# Patient Record
Sex: Female | Born: 1950 | Race: White | Hispanic: No | Marital: Married | Smoking: Never smoker
Health system: Southern US, Community
[De-identification: ages and names within clinical notes are randomized; demographics above are authoritative.]

## PROBLEM LIST (undated history)

## (undated) DIAGNOSIS — I10 Essential (primary) hypertension: Secondary | ICD-10-CM

## (undated) DIAGNOSIS — N186 End stage renal disease: Secondary | ICD-10-CM

## (undated) DIAGNOSIS — M549 Dorsalgia, unspecified: Secondary | ICD-10-CM

## (undated) DIAGNOSIS — Z124 Encounter for screening for malignant neoplasm of cervix: Secondary | ICD-10-CM

## (undated) DIAGNOSIS — D649 Anemia, unspecified: Secondary | ICD-10-CM

## (undated) DIAGNOSIS — S42401A Unspecified fracture of lower end of right humerus, initial encounter for closed fracture: Secondary | ICD-10-CM

## (undated) DIAGNOSIS — C029 Malignant neoplasm of tongue, unspecified: Secondary | ICD-10-CM

## (undated) DIAGNOSIS — N200 Calculus of kidney: Secondary | ICD-10-CM

## (undated) DIAGNOSIS — K635 Polyp of colon: Secondary | ICD-10-CM

## (undated) DIAGNOSIS — G4730 Sleep apnea, unspecified: Secondary | ICD-10-CM

## (undated) DIAGNOSIS — Z945 Skin transplant status: Secondary | ICD-10-CM

## (undated) DIAGNOSIS — F329 Major depressive disorder, single episode, unspecified: Secondary | ICD-10-CM

## (undated) DIAGNOSIS — J810 Acute pulmonary edema: Secondary | ICD-10-CM

## (undated) DIAGNOSIS — J189 Pneumonia, unspecified organism: Secondary | ICD-10-CM

## (undated) DIAGNOSIS — K219 Gastro-esophageal reflux disease without esophagitis: Secondary | ICD-10-CM

## (undated) DIAGNOSIS — E669 Obesity, unspecified: Secondary | ICD-10-CM

## (undated) DIAGNOSIS — M1990 Unspecified osteoarthritis, unspecified site: Secondary | ICD-10-CM

## (undated) DIAGNOSIS — E039 Hypothyroidism, unspecified: Secondary | ICD-10-CM

## (undated) DIAGNOSIS — K626 Ulcer of anus and rectum: Secondary | ICD-10-CM

## (undated) DIAGNOSIS — F32A Depression, unspecified: Secondary | ICD-10-CM

## (undated) DIAGNOSIS — Z9889 Other specified postprocedural states: Secondary | ICD-10-CM

## (undated) HISTORY — PX: CATARACT EXTRACTION: SUR2

## (undated) HISTORY — DX: Malignant neoplasm of tongue, unspecified: C02.9

## (undated) HISTORY — DX: Unspecified fracture of lower end of right humerus, initial encounter for closed fracture: S42.401A

## (undated) HISTORY — DX: Obesity, unspecified: E66.9

## (undated) HISTORY — DX: Essential (primary) hypertension: I10

## (undated) HISTORY — PX: DILATION AND CURETTAGE OF UTERUS: SHX78

## (undated) HISTORY — DX: Major depressive disorder, single episode, unspecified: F32.9

## (undated) HISTORY — DX: Hypothyroidism, unspecified: E03.9

## (undated) HISTORY — DX: Depression, unspecified: F32.A

## (undated) HISTORY — DX: Polyp of colon: K63.5

## (undated) HISTORY — DX: Anemia, unspecified: D64.9

## (undated) HISTORY — DX: Calculus of kidney: N20.0

## (undated) HISTORY — DX: Dorsalgia, unspecified: M54.9

## (undated) HISTORY — DX: Encounter for screening for malignant neoplasm of cervix: Z12.4

---

## 1998-08-31 ENCOUNTER — Other Ambulatory Visit: Admission: RE | Admit: 1998-08-31 | Discharge: 1998-08-31 | Payer: Self-pay | Admitting: *Deleted

## 1999-07-01 ENCOUNTER — Ambulatory Visit (HOSPITAL_COMMUNITY): Admission: RE | Admit: 1999-07-01 | Discharge: 1999-07-01 | Payer: Self-pay | Admitting: *Deleted

## 1999-08-16 DIAGNOSIS — K635 Polyp of colon: Secondary | ICD-10-CM

## 1999-08-16 HISTORY — DX: Polyp of colon: K63.5

## 1999-11-04 ENCOUNTER — Other Ambulatory Visit: Admission: RE | Admit: 1999-11-04 | Discharge: 1999-11-04 | Payer: Self-pay | Admitting: *Deleted

## 2000-01-27 ENCOUNTER — Encounter (INDEPENDENT_AMBULATORY_CARE_PROVIDER_SITE_OTHER): Payer: Self-pay | Admitting: *Deleted

## 2000-01-27 ENCOUNTER — Ambulatory Visit (HOSPITAL_COMMUNITY): Admission: RE | Admit: 2000-01-27 | Discharge: 2000-01-27 | Payer: Self-pay | Admitting: Gastroenterology

## 2001-01-17 ENCOUNTER — Other Ambulatory Visit: Admission: RE | Admit: 2001-01-17 | Discharge: 2001-01-17 | Payer: Self-pay | Admitting: *Deleted

## 2002-01-13 ENCOUNTER — Emergency Department (HOSPITAL_COMMUNITY): Admission: EM | Admit: 2002-01-13 | Discharge: 2002-01-13 | Payer: Self-pay | Admitting: Emergency Medicine

## 2002-01-13 ENCOUNTER — Encounter: Payer: Self-pay | Admitting: Emergency Medicine

## 2003-08-03 ENCOUNTER — Emergency Department (HOSPITAL_COMMUNITY): Admission: AD | Admit: 2003-08-03 | Discharge: 2003-08-03 | Payer: Self-pay | Admitting: Family Medicine

## 2003-08-10 ENCOUNTER — Emergency Department (HOSPITAL_COMMUNITY): Admission: AD | Admit: 2003-08-10 | Discharge: 2003-08-10 | Payer: Self-pay | Admitting: Internal Medicine

## 2004-07-07 ENCOUNTER — Ambulatory Visit: Payer: Self-pay | Admitting: Internal Medicine

## 2004-08-02 ENCOUNTER — Emergency Department (HOSPITAL_COMMUNITY): Admission: EM | Admit: 2004-08-02 | Discharge: 2004-08-02 | Payer: Self-pay | Admitting: Family Medicine

## 2004-11-10 ENCOUNTER — Ambulatory Visit: Payer: Self-pay | Admitting: Internal Medicine

## 2004-11-19 ENCOUNTER — Ambulatory Visit: Payer: Self-pay | Admitting: Internal Medicine

## 2005-01-03 ENCOUNTER — Ambulatory Visit: Payer: Self-pay | Admitting: Internal Medicine

## 2005-06-06 ENCOUNTER — Emergency Department (HOSPITAL_COMMUNITY): Admission: EM | Admit: 2005-06-06 | Discharge: 2005-06-06 | Payer: Self-pay | Admitting: Family Medicine

## 2005-06-08 ENCOUNTER — Emergency Department (HOSPITAL_COMMUNITY): Admission: EM | Admit: 2005-06-08 | Discharge: 2005-06-08 | Payer: Self-pay | Admitting: Family Medicine

## 2005-06-24 ENCOUNTER — Ambulatory Visit: Payer: Self-pay | Admitting: Internal Medicine

## 2005-07-01 ENCOUNTER — Ambulatory Visit: Payer: Self-pay | Admitting: Internal Medicine

## 2005-07-04 ENCOUNTER — Ambulatory Visit: Payer: Self-pay | Admitting: Internal Medicine

## 2005-11-07 ENCOUNTER — Emergency Department (HOSPITAL_COMMUNITY): Admission: EM | Admit: 2005-11-07 | Discharge: 2005-11-07 | Payer: Self-pay | Admitting: Family Medicine

## 2005-12-13 ENCOUNTER — Encounter (INDEPENDENT_AMBULATORY_CARE_PROVIDER_SITE_OTHER): Payer: Self-pay | Admitting: Infectious Diseases

## 2005-12-14 ENCOUNTER — Encounter (INDEPENDENT_AMBULATORY_CARE_PROVIDER_SITE_OTHER): Payer: Self-pay | Admitting: Infectious Diseases

## 2005-12-14 ENCOUNTER — Ambulatory Visit: Payer: Self-pay | Admitting: Internal Medicine

## 2005-12-20 ENCOUNTER — Ambulatory Visit (HOSPITAL_COMMUNITY): Admission: RE | Admit: 2005-12-20 | Discharge: 2005-12-20 | Payer: Self-pay | Admitting: Internal Medicine

## 2005-12-21 ENCOUNTER — Encounter (INDEPENDENT_AMBULATORY_CARE_PROVIDER_SITE_OTHER): Payer: Self-pay | Admitting: Infectious Diseases

## 2005-12-29 ENCOUNTER — Ambulatory Visit: Payer: Self-pay | Admitting: Internal Medicine

## 2006-01-24 ENCOUNTER — Ambulatory Visit: Payer: Self-pay | Admitting: Internal Medicine

## 2006-05-01 ENCOUNTER — Ambulatory Visit: Payer: Self-pay | Admitting: Hospitalist

## 2006-05-02 ENCOUNTER — Ambulatory Visit: Payer: Self-pay | Admitting: Hospitalist

## 2006-06-16 DIAGNOSIS — I10 Essential (primary) hypertension: Secondary | ICD-10-CM | POA: Insufficient documentation

## 2006-06-16 DIAGNOSIS — E039 Hypothyroidism, unspecified: Secondary | ICD-10-CM | POA: Insufficient documentation

## 2006-07-18 ENCOUNTER — Ambulatory Visit: Payer: Self-pay | Admitting: *Deleted

## 2006-07-18 ENCOUNTER — Encounter (INDEPENDENT_AMBULATORY_CARE_PROVIDER_SITE_OTHER): Payer: Self-pay | Admitting: Infectious Diseases

## 2006-07-18 LAB — CONVERTED CEMR LAB: BUN: 32 mg/dL — ABNORMAL HIGH (ref 6–23)

## 2006-09-25 ENCOUNTER — Telehealth (INDEPENDENT_AMBULATORY_CARE_PROVIDER_SITE_OTHER): Payer: Self-pay | Admitting: *Deleted

## 2006-10-09 ENCOUNTER — Telehealth (INDEPENDENT_AMBULATORY_CARE_PROVIDER_SITE_OTHER): Payer: Self-pay | Admitting: *Deleted

## 2006-10-20 ENCOUNTER — Telehealth (INDEPENDENT_AMBULATORY_CARE_PROVIDER_SITE_OTHER): Payer: Self-pay | Admitting: Pharmacy Technician

## 2006-10-26 ENCOUNTER — Ambulatory Visit (HOSPITAL_COMMUNITY): Admission: RE | Admit: 2006-10-26 | Discharge: 2006-10-26 | Payer: Self-pay | Admitting: Internal Medicine

## 2006-10-26 ENCOUNTER — Ambulatory Visit: Payer: Self-pay | Admitting: *Deleted

## 2006-10-26 DIAGNOSIS — L408 Other psoriasis: Secondary | ICD-10-CM | POA: Insufficient documentation

## 2006-10-26 DIAGNOSIS — E785 Hyperlipidemia, unspecified: Secondary | ICD-10-CM

## 2006-10-27 ENCOUNTER — Ambulatory Visit: Payer: Self-pay | Admitting: Internal Medicine

## 2006-10-27 ENCOUNTER — Encounter (INDEPENDENT_AMBULATORY_CARE_PROVIDER_SITE_OTHER): Payer: Self-pay | Admitting: Infectious Diseases

## 2006-10-27 LAB — CONVERTED CEMR LAB
BUN: 35 mg/dL — ABNORMAL HIGH (ref 6–23)
CO2: 21 meq/L (ref 19–32)
Calcium: 9.1 mg/dL (ref 8.4–10.5)
Chloride: 104 meq/L (ref 96–112)
Glucose, Bld: 106 mg/dL — ABNORMAL HIGH (ref 70–99)
HDL: 51 mg/dL (ref >39)
LDL Cholesterol: 107 mg/dL — ABNORMAL HIGH (ref 0–99)
Potassium: 4.3 meq/L (ref 3.5–5.3)
TSH: 0.876 microintl units/mL (ref 0.350–5.50)
Triglycerides: 154 mg/dL — ABNORMAL HIGH (ref <150)
VLDL: 31 mg/dL (ref 0–40)

## 2006-11-01 ENCOUNTER — Ambulatory Visit: Payer: Self-pay | Admitting: Internal Medicine

## 2006-11-07 ENCOUNTER — Telehealth (INDEPENDENT_AMBULATORY_CARE_PROVIDER_SITE_OTHER): Payer: Self-pay | Admitting: *Deleted

## 2006-11-17 ENCOUNTER — Encounter (INDEPENDENT_AMBULATORY_CARE_PROVIDER_SITE_OTHER): Payer: Self-pay | Admitting: Infectious Diseases

## 2006-11-21 ENCOUNTER — Ambulatory Visit: Payer: Self-pay | Admitting: Internal Medicine

## 2006-11-23 ENCOUNTER — Encounter (INDEPENDENT_AMBULATORY_CARE_PROVIDER_SITE_OTHER): Payer: Self-pay | Admitting: Infectious Diseases

## 2006-11-23 ENCOUNTER — Ambulatory Visit: Payer: Self-pay | Admitting: Internal Medicine

## 2007-02-06 ENCOUNTER — Telehealth (INDEPENDENT_AMBULATORY_CARE_PROVIDER_SITE_OTHER): Payer: Self-pay | Admitting: Infectious Diseases

## 2007-03-06 ENCOUNTER — Telehealth: Payer: Self-pay | Admitting: *Deleted

## 2007-03-22 ENCOUNTER — Telehealth: Payer: Self-pay | Admitting: Infectious Disease

## 2007-04-17 ENCOUNTER — Ambulatory Visit: Payer: Self-pay | Admitting: Internal Medicine

## 2007-04-17 ENCOUNTER — Encounter (INDEPENDENT_AMBULATORY_CARE_PROVIDER_SITE_OTHER): Payer: Self-pay | Admitting: Infectious Diseases

## 2007-04-17 LAB — CONVERTED CEMR LAB
AST: 15 units/L (ref 0–37)
Albumin: 4.4 g/dL (ref 3.5–5.2)
Alkaline Phosphatase: 86 units/L (ref 39–117)
BUN: 33 mg/dL — ABNORMAL HIGH (ref 6–23)
CO2: 21 meq/L (ref 19–32)
Calcium: 9.9 mg/dL (ref 8.4–10.5)
Potassium: 4.4 meq/L (ref 3.5–5.3)
Total Bilirubin: 0.4 mg/dL (ref 0.3–1.2)
Total Protein: 7.5 g/dL (ref 6.0–8.3)

## 2007-04-20 ENCOUNTER — Emergency Department (HOSPITAL_COMMUNITY): Admission: EM | Admit: 2007-04-20 | Discharge: 2007-04-20 | Payer: Self-pay | Admitting: Family Medicine

## 2007-04-20 ENCOUNTER — Encounter (INDEPENDENT_AMBULATORY_CARE_PROVIDER_SITE_OTHER): Payer: Self-pay | Admitting: Infectious Diseases

## 2007-04-30 ENCOUNTER — Telehealth: Payer: Self-pay | Admitting: *Deleted

## 2007-05-01 ENCOUNTER — Ambulatory Visit: Payer: Self-pay | Admitting: Internal Medicine

## 2007-05-01 ENCOUNTER — Ambulatory Visit (HOSPITAL_COMMUNITY): Admission: RE | Admit: 2007-05-01 | Discharge: 2007-05-01 | Payer: Self-pay | Admitting: Internal Medicine

## 2007-05-01 ENCOUNTER — Encounter (INDEPENDENT_AMBULATORY_CARE_PROVIDER_SITE_OTHER): Payer: Self-pay | Admitting: Infectious Diseases

## 2007-05-01 DIAGNOSIS — M549 Dorsalgia, unspecified: Secondary | ICD-10-CM | POA: Insufficient documentation

## 2007-05-02 LAB — CONVERTED CEMR LAB
HDL: 56 mg/dL (ref >39)
LDL Cholesterol: 110 mg/dL — ABNORMAL HIGH (ref 0–99)
Total CHOL/HDL Ratio: 3.5
VLDL: 28 mg/dL (ref 0–40)

## 2007-05-04 ENCOUNTER — Encounter: Payer: Self-pay | Admitting: *Deleted

## 2007-05-10 ENCOUNTER — Telehealth: Payer: Self-pay | Admitting: *Deleted

## 2007-05-16 ENCOUNTER — Encounter (INDEPENDENT_AMBULATORY_CARE_PROVIDER_SITE_OTHER): Payer: Self-pay | Admitting: Infectious Diseases

## 2007-05-16 ENCOUNTER — Ambulatory Visit: Payer: Self-pay | Admitting: Hospitalist

## 2007-05-16 DIAGNOSIS — H9190 Unspecified hearing loss, unspecified ear: Secondary | ICD-10-CM | POA: Insufficient documentation

## 2007-05-25 ENCOUNTER — Ambulatory Visit (HOSPITAL_COMMUNITY): Admission: RE | Admit: 2007-05-25 | Discharge: 2007-05-25 | Payer: Self-pay | Admitting: Infectious Diseases

## 2007-06-28 ENCOUNTER — Telehealth (INDEPENDENT_AMBULATORY_CARE_PROVIDER_SITE_OTHER): Payer: Self-pay | Admitting: Infectious Diseases

## 2007-09-05 DIAGNOSIS — K029 Dental caries, unspecified: Secondary | ICD-10-CM | POA: Insufficient documentation

## 2007-10-29 ENCOUNTER — Emergency Department (HOSPITAL_COMMUNITY): Admission: EM | Admit: 2007-10-29 | Discharge: 2007-10-29 | Payer: Self-pay | Admitting: Emergency Medicine

## 2007-11-02 ENCOUNTER — Telehealth: Payer: Self-pay | Admitting: *Deleted

## 2007-11-02 ENCOUNTER — Encounter: Payer: Self-pay | Admitting: Licensed Clinical Social Worker

## 2007-11-02 ENCOUNTER — Encounter: Payer: Self-pay | Admitting: Internal Medicine

## 2007-11-02 ENCOUNTER — Ambulatory Visit: Payer: Self-pay | Admitting: Hospitalist

## 2007-11-02 DIAGNOSIS — N200 Calculus of kidney: Secondary | ICD-10-CM

## 2007-11-14 HISTORY — PX: LITHOTRIPSY: SUR834

## 2007-11-16 ENCOUNTER — Encounter: Payer: Self-pay | Admitting: Internal Medicine

## 2007-12-05 ENCOUNTER — Ambulatory Visit (HOSPITAL_COMMUNITY): Admission: RE | Admit: 2007-12-05 | Discharge: 2007-12-05 | Payer: Self-pay | Admitting: Urology

## 2008-01-21 ENCOUNTER — Encounter: Payer: Self-pay | Admitting: Internal Medicine

## 2008-01-21 ENCOUNTER — Ambulatory Visit: Payer: Self-pay | Admitting: Internal Medicine

## 2008-01-21 LAB — CONVERTED CEMR LAB: Blood Glucose, Fingerstick: 245

## 2008-01-22 ENCOUNTER — Telehealth (INDEPENDENT_AMBULATORY_CARE_PROVIDER_SITE_OTHER): Payer: Self-pay | Admitting: Infectious Diseases

## 2008-01-23 ENCOUNTER — Telehealth (INDEPENDENT_AMBULATORY_CARE_PROVIDER_SITE_OTHER): Payer: Self-pay | Admitting: Infectious Diseases

## 2008-02-02 ENCOUNTER — Encounter: Payer: Self-pay | Admitting: Internal Medicine

## 2008-02-25 ENCOUNTER — Emergency Department (HOSPITAL_COMMUNITY): Admission: EM | Admit: 2008-02-25 | Discharge: 2008-02-25 | Payer: Self-pay | Admitting: Emergency Medicine

## 2008-03-05 ENCOUNTER — Ambulatory Visit: Payer: Self-pay | Admitting: Infectious Diseases

## 2008-03-05 ENCOUNTER — Encounter (INDEPENDENT_AMBULATORY_CARE_PROVIDER_SITE_OTHER): Payer: Self-pay | Admitting: *Deleted

## 2008-03-05 LAB — CONVERTED CEMR LAB
AST: 16 units/L (ref 0–37)
Albumin: 4.2 g/dL (ref 3.5–5.2)
Blood Glucose, Fingerstick: 274
CO2: 25 meq/L (ref 19–32)
Creatinine, Ser: 1.26 mg/dL — ABNORMAL HIGH (ref 0.40–1.20)
Glucose, Bld: 227 mg/dL — ABNORMAL HIGH (ref 70–99)
Sodium: 141 meq/L (ref 135–145)
TSH: 2.249 microintl units/mL (ref 0.350–4.50)

## 2008-04-07 ENCOUNTER — Ambulatory Visit: Payer: Self-pay | Admitting: Internal Medicine

## 2008-04-07 ENCOUNTER — Encounter (INDEPENDENT_AMBULATORY_CARE_PROVIDER_SITE_OTHER): Payer: Self-pay | Admitting: *Deleted

## 2008-04-07 DIAGNOSIS — E669 Obesity, unspecified: Secondary | ICD-10-CM

## 2008-04-07 DIAGNOSIS — F329 Major depressive disorder, single episode, unspecified: Secondary | ICD-10-CM

## 2008-04-10 LAB — CONVERTED CEMR LAB
ALT: 23 units/L (ref 0–35)
AST: 18 units/L (ref 0–37)
Albumin: 4.2 g/dL (ref 3.5–5.2)
Alkaline Phosphatase: 81 units/L (ref 39–117)
BUN: 33 mg/dL — ABNORMAL HIGH (ref 6–23)
CO2: 23 meq/L (ref 19–32)
Chloride: 105 meq/L (ref 96–112)
Cholesterol: 220 mg/dL — ABNORMAL HIGH (ref 0–200)
Potassium: 4.4 meq/L (ref 3.5–5.3)
Total Bilirubin: 0.3 mg/dL (ref 0.3–1.2)
Total CHOL/HDL Ratio: 4.3
Triglycerides: 290 mg/dL — ABNORMAL HIGH (ref <150)
VLDL: 58 mg/dL — ABNORMAL HIGH (ref 0–40)

## 2008-04-14 ENCOUNTER — Telehealth (INDEPENDENT_AMBULATORY_CARE_PROVIDER_SITE_OTHER): Payer: Self-pay | Admitting: *Deleted

## 2008-05-13 ENCOUNTER — Ambulatory Visit: Payer: Self-pay | Admitting: Internal Medicine

## 2008-05-13 ENCOUNTER — Encounter (INDEPENDENT_AMBULATORY_CARE_PROVIDER_SITE_OTHER): Payer: Self-pay | Admitting: *Deleted

## 2008-05-15 ENCOUNTER — Encounter (INDEPENDENT_AMBULATORY_CARE_PROVIDER_SITE_OTHER): Payer: Self-pay | Admitting: *Deleted

## 2008-05-15 LAB — HM DIABETES EYE EXAM

## 2008-06-17 ENCOUNTER — Telehealth: Payer: Self-pay | Admitting: *Deleted

## 2008-06-24 ENCOUNTER — Ambulatory Visit (HOSPITAL_COMMUNITY): Admission: RE | Admit: 2008-06-24 | Discharge: 2008-06-24 | Payer: Self-pay | Admitting: Specialist

## 2008-07-01 ENCOUNTER — Ambulatory Visit: Payer: Self-pay | Admitting: Internal Medicine

## 2008-07-04 ENCOUNTER — Ambulatory Visit: Payer: Self-pay | Admitting: Infectious Diseases

## 2008-08-12 ENCOUNTER — Telehealth (INDEPENDENT_AMBULATORY_CARE_PROVIDER_SITE_OTHER): Payer: Self-pay | Admitting: *Deleted

## 2008-08-14 ENCOUNTER — Emergency Department (HOSPITAL_COMMUNITY): Admission: EM | Admit: 2008-08-14 | Discharge: 2008-08-14 | Payer: Self-pay | Admitting: Family Medicine

## 2008-09-05 ENCOUNTER — Telehealth (INDEPENDENT_AMBULATORY_CARE_PROVIDER_SITE_OTHER): Payer: Self-pay | Admitting: *Deleted

## 2008-09-17 ENCOUNTER — Encounter (INDEPENDENT_AMBULATORY_CARE_PROVIDER_SITE_OTHER): Payer: Self-pay | Admitting: *Deleted

## 2008-09-18 ENCOUNTER — Ambulatory Visit: Payer: Self-pay | Admitting: Internal Medicine

## 2008-12-02 ENCOUNTER — Telehealth (INDEPENDENT_AMBULATORY_CARE_PROVIDER_SITE_OTHER): Payer: Self-pay | Admitting: *Deleted

## 2008-12-15 ENCOUNTER — Ambulatory Visit: Payer: Self-pay | Admitting: Internal Medicine

## 2008-12-15 ENCOUNTER — Encounter (INDEPENDENT_AMBULATORY_CARE_PROVIDER_SITE_OTHER): Payer: Self-pay | Admitting: *Deleted

## 2008-12-15 DIAGNOSIS — J301 Allergic rhinitis due to pollen: Secondary | ICD-10-CM | POA: Insufficient documentation

## 2008-12-17 ENCOUNTER — Telehealth (INDEPENDENT_AMBULATORY_CARE_PROVIDER_SITE_OTHER): Payer: Self-pay | Admitting: *Deleted

## 2008-12-17 DIAGNOSIS — E1140 Type 2 diabetes mellitus with diabetic neuropathy, unspecified: Secondary | ICD-10-CM

## 2008-12-17 DIAGNOSIS — Z794 Long term (current) use of insulin: Secondary | ICD-10-CM

## 2008-12-17 DIAGNOSIS — N184 Chronic kidney disease, stage 4 (severe): Secondary | ICD-10-CM

## 2008-12-17 LAB — CONVERTED CEMR LAB
ALT: 37 units/L — ABNORMAL HIGH (ref 0–35)
AST: 31 units/L (ref 0–37)
Albumin: 4.1 g/dL (ref 3.5–5.2)
BUN: 36 mg/dL — ABNORMAL HIGH (ref 6–23)
Calcium: 9.7 mg/dL (ref 8.4–10.5)
Chloride: 103 meq/L (ref 96–112)
GFR calc Af Amer: 48 mL/min — ABNORMAL LOW (ref >60)
Glucose, Bld: 376 mg/dL — ABNORMAL HIGH (ref 70–99)
HDL: 47 mg/dL (ref >39)
LDL Cholesterol: 105 mg/dL — ABNORMAL HIGH (ref 0–99)
Sodium: 138 meq/L (ref 135–145)
Total Protein: 7 g/dL (ref 6.0–8.3)

## 2009-01-07 ENCOUNTER — Ambulatory Visit: Payer: Self-pay | Admitting: *Deleted

## 2009-01-07 ENCOUNTER — Telehealth (INDEPENDENT_AMBULATORY_CARE_PROVIDER_SITE_OTHER): Payer: Self-pay | Admitting: *Deleted

## 2009-01-07 ENCOUNTER — Encounter (INDEPENDENT_AMBULATORY_CARE_PROVIDER_SITE_OTHER): Payer: Self-pay | Admitting: *Deleted

## 2009-01-07 LAB — CONVERTED CEMR LAB: Blood Glucose, Fingerstick: 378

## 2009-01-21 ENCOUNTER — Emergency Department (HOSPITAL_COMMUNITY): Admission: EM | Admit: 2009-01-21 | Discharge: 2009-01-21 | Payer: Self-pay | Admitting: Family Medicine

## 2009-01-27 ENCOUNTER — Telehealth (INDEPENDENT_AMBULATORY_CARE_PROVIDER_SITE_OTHER): Payer: Self-pay | Admitting: *Deleted

## 2009-03-25 ENCOUNTER — Telehealth (INDEPENDENT_AMBULATORY_CARE_PROVIDER_SITE_OTHER): Payer: Self-pay | Admitting: Internal Medicine

## 2009-04-17 ENCOUNTER — Emergency Department (HOSPITAL_COMMUNITY): Admission: EM | Admit: 2009-04-17 | Discharge: 2009-04-17 | Payer: Self-pay | Admitting: Emergency Medicine

## 2009-04-22 ENCOUNTER — Ambulatory Visit: Payer: Self-pay | Admitting: Infectious Diseases

## 2009-04-22 LAB — CONVERTED CEMR LAB
Glucose, Urine, Semiquant: NEGATIVE
Ketones, urine, test strip: NEGATIVE
Nitrite: NEGATIVE
Protein, U semiquant: 30

## 2009-04-23 LAB — CONVERTED CEMR LAB
ALT: 34 units/L (ref 0–35)
Basophils Relative: 0 % (ref 0–1)
CO2: 19 meq/L (ref 19–32)
Chloride: 107 meq/L (ref 96–112)
Cholesterol: 177 mg/dL (ref 0–200)
Creatinine, Urine: 96.7 mg/dL
Lymphocytes Relative: 25 % (ref 12–46)
Lymphs Abs: 2.3 10*3/uL (ref 0.7–4.0)
Microalb Creat Ratio: 52.1 mg/g — ABNORMAL HIGH (ref 0.0–30.0)
Microalb, Ur: 5.04 mg/dL — ABNORMAL HIGH (ref 0.00–1.89)
Monocytes Relative: 6 % (ref 3–12)
Neutro Abs: 6.1 10*3/uL (ref 1.7–7.7)
Neutrophils Relative %: 66 % (ref 43–77)
Potassium: 4.2 meq/L (ref 3.5–5.3)
RBC: 4.02 M/uL (ref 3.87–5.11)
Sodium: 142 meq/L (ref 135–145)
Total Bilirubin: 0.4 mg/dL (ref 0.3–1.2)
Total Protein: 7 g/dL (ref 6.0–8.3)
VLDL: 35 mg/dL (ref 0–40)
WBC: 9.4 10*3/uL (ref 4.0–10.5)

## 2009-04-24 ENCOUNTER — Ambulatory Visit (HOSPITAL_COMMUNITY): Admission: RE | Admit: 2009-04-24 | Discharge: 2009-04-24 | Payer: Self-pay | Admitting: Internal Medicine

## 2009-04-27 ENCOUNTER — Ambulatory Visit (HOSPITAL_COMMUNITY): Admission: RE | Admit: 2009-04-27 | Discharge: 2009-04-27 | Payer: Self-pay | Admitting: Infectious Diseases

## 2009-04-27 ENCOUNTER — Ambulatory Visit: Payer: Self-pay | Admitting: Infectious Diseases

## 2009-04-27 ENCOUNTER — Encounter: Payer: Self-pay | Admitting: Internal Medicine

## 2009-04-27 LAB — CONVERTED CEMR LAB
Blood Glucose, Fingerstick: 449
CO2: 26 meq/L (ref 19–32)
Calcium: 8.6 mg/dL (ref 8.4–10.5)
Sodium: 135 meq/L (ref 135–145)

## 2009-04-29 ENCOUNTER — Ambulatory Visit: Payer: Self-pay | Admitting: Infectious Diseases

## 2009-04-29 LAB — CONVERTED CEMR LAB: Blood Glucose, Fingerstick: 276

## 2009-04-30 LAB — CONVERTED CEMR LAB
BUN: 34 mg/dL — ABNORMAL HIGH (ref 6–23)
Chloride: 101 meq/L (ref 96–112)
Potassium: 4.5 meq/L (ref 3.5–5.3)

## 2009-05-01 ENCOUNTER — Telehealth: Payer: Self-pay | Admitting: Internal Medicine

## 2009-05-11 ENCOUNTER — Encounter: Payer: Self-pay | Admitting: Internal Medicine

## 2009-05-11 ENCOUNTER — Ambulatory Visit: Payer: Self-pay | Admitting: Internal Medicine

## 2009-05-11 ENCOUNTER — Telehealth (INDEPENDENT_AMBULATORY_CARE_PROVIDER_SITE_OTHER): Payer: Self-pay | Admitting: *Deleted

## 2009-05-11 LAB — CONVERTED CEMR LAB
BUN: 32 mg/dL — ABNORMAL HIGH (ref 6–23)
Chloride: 97 meq/L (ref 96–112)
Glucose, Bld: 276 mg/dL — ABNORMAL HIGH (ref 70–99)
Potassium: 4.2 meq/L (ref 3.5–5.3)

## 2009-05-13 ENCOUNTER — Ambulatory Visit: Payer: Self-pay | Admitting: Infectious Diseases

## 2009-05-18 ENCOUNTER — Encounter: Payer: Self-pay | Admitting: Internal Medicine

## 2009-05-21 ENCOUNTER — Telehealth: Payer: Self-pay | Admitting: *Deleted

## 2009-05-25 ENCOUNTER — Ambulatory Visit: Payer: Self-pay | Admitting: Internal Medicine

## 2009-05-27 LAB — CONVERTED CEMR LAB
BUN: 31 mg/dL — ABNORMAL HIGH (ref 6–23)
CO2: 22 meq/L (ref 19–32)
Chloride: 102 meq/L (ref 96–112)
Creatinine, Ser: 1.34 mg/dL — ABNORMAL HIGH (ref 0.40–1.20)
Glucose, Bld: 265 mg/dL — ABNORMAL HIGH (ref 70–99)

## 2009-05-28 ENCOUNTER — Telehealth (INDEPENDENT_AMBULATORY_CARE_PROVIDER_SITE_OTHER): Payer: Self-pay | Admitting: *Deleted

## 2009-06-12 ENCOUNTER — Ambulatory Visit: Payer: Self-pay | Admitting: Internal Medicine

## 2009-06-12 LAB — CONVERTED CEMR LAB
BUN: 38 mg/dL — ABNORMAL HIGH (ref 6–23)
Calcium: 8.9 mg/dL (ref 8.4–10.5)
Glucose, Bld: 564 mg/dL (ref 70–99)
Potassium: 4.4 meq/L (ref 3.5–5.3)
Protein, U semiquant: NEGATIVE
Urobilinogen, UA: 0.2

## 2009-06-16 ENCOUNTER — Telehealth: Payer: Self-pay | Admitting: Internal Medicine

## 2009-06-25 ENCOUNTER — Encounter: Payer: Self-pay | Admitting: Internal Medicine

## 2009-06-25 ENCOUNTER — Ambulatory Visit: Payer: Self-pay | Admitting: Internal Medicine

## 2009-06-25 DIAGNOSIS — IMO0002 Reserved for concepts with insufficient information to code with codable children: Secondary | ICD-10-CM | POA: Insufficient documentation

## 2009-07-13 ENCOUNTER — Emergency Department (HOSPITAL_COMMUNITY): Admission: EM | Admit: 2009-07-13 | Discharge: 2009-07-13 | Payer: Self-pay | Admitting: Emergency Medicine

## 2009-07-13 ENCOUNTER — Telehealth: Payer: Self-pay | Admitting: Internal Medicine

## 2009-07-16 ENCOUNTER — Telehealth: Payer: Self-pay | Admitting: Internal Medicine

## 2009-09-03 ENCOUNTER — Encounter: Payer: Self-pay | Admitting: Internal Medicine

## 2009-09-04 ENCOUNTER — Ambulatory Visit: Payer: Self-pay | Admitting: Internal Medicine

## 2009-09-04 LAB — CONVERTED CEMR LAB
BUN: 44 mg/dL — ABNORMAL HIGH (ref 6–23)
Chloride: 104 meq/L (ref 96–112)
Creatinine, Ser: 1.37 mg/dL — ABNORMAL HIGH (ref 0.40–1.20)
Glucose, Bld: 221 mg/dL — ABNORMAL HIGH (ref 70–99)
Hgb A1c MFr Bld: 9.8 %

## 2009-09-08 ENCOUNTER — Ambulatory Visit (HOSPITAL_COMMUNITY): Admission: RE | Admit: 2009-09-08 | Discharge: 2009-09-08 | Payer: Self-pay | Admitting: Internal Medicine

## 2009-10-02 ENCOUNTER — Emergency Department (HOSPITAL_COMMUNITY): Admission: EM | Admit: 2009-10-02 | Discharge: 2009-10-02 | Payer: Self-pay | Admitting: Emergency Medicine

## 2009-11-24 ENCOUNTER — Encounter: Payer: Self-pay | Admitting: Internal Medicine

## 2009-12-14 ENCOUNTER — Telehealth: Payer: Self-pay | Admitting: Internal Medicine

## 2010-01-04 ENCOUNTER — Ambulatory Visit: Payer: Self-pay | Admitting: Internal Medicine

## 2010-01-04 LAB — CONVERTED CEMR LAB: Blood Glucose, Fingerstick: 286

## 2010-01-06 ENCOUNTER — Encounter: Payer: Self-pay | Admitting: Internal Medicine

## 2010-01-06 LAB — CONVERTED CEMR LAB
AST: 37 units/L (ref 0–37)
Alkaline Phosphatase: 96 units/L (ref 39–117)
BUN: 46 mg/dL — ABNORMAL HIGH (ref 6–23)
Basophils Absolute: 0 10*3/uL (ref 0.0–0.1)
Basophils Relative: 1 % (ref 0–1)
Creatinine, Ser: 1.53 mg/dL — ABNORMAL HIGH (ref 0.40–1.20)
Eosinophils Absolute: 0.4 10*3/uL (ref 0.0–0.7)
Eosinophils Relative: 5 % (ref 0–5)
Glucose, Bld: 230 mg/dL — ABNORMAL HIGH (ref 70–99)
HCT: 40.1 % (ref 36.0–46.0)
Lymphocytes Relative: 35 % (ref 12–46)
MCHC: 33.4 g/dL (ref 30.0–36.0)
Platelets: 293 10*3/uL (ref 150–400)
Potassium: 4.5 meq/L (ref 3.5–5.3)
RDW: 14.2 % (ref 11.5–15.5)
Total Bilirubin: 0.5 mg/dL (ref 0.3–1.2)

## 2010-01-18 ENCOUNTER — Ambulatory Visit: Payer: Self-pay | Admitting: Internal Medicine

## 2010-01-18 LAB — CONVERTED CEMR LAB: Blood Glucose, Fingerstick: 266

## 2010-01-26 ENCOUNTER — Telehealth: Payer: Self-pay | Admitting: Licensed Clinical Social Worker

## 2010-01-26 LAB — HM COLONOSCOPY: HM Colonoscopy: NORMAL

## 2010-02-03 ENCOUNTER — Encounter: Payer: Self-pay | Admitting: Licensed Clinical Social Worker

## 2010-02-11 ENCOUNTER — Telehealth: Payer: Self-pay | Admitting: Licensed Clinical Social Worker

## 2010-02-22 ENCOUNTER — Telehealth: Payer: Self-pay | Admitting: Internal Medicine

## 2010-03-11 ENCOUNTER — Ambulatory Visit: Payer: Self-pay | Admitting: Internal Medicine

## 2010-05-13 ENCOUNTER — Telehealth: Payer: Self-pay | Admitting: Internal Medicine

## 2010-05-26 ENCOUNTER — Telehealth (INDEPENDENT_AMBULATORY_CARE_PROVIDER_SITE_OTHER): Payer: Self-pay | Admitting: *Deleted

## 2010-07-26 ENCOUNTER — Telehealth: Payer: Self-pay | Admitting: Internal Medicine

## 2010-09-04 ENCOUNTER — Encounter: Payer: Self-pay | Admitting: Internal Medicine

## 2010-09-05 ENCOUNTER — Encounter: Payer: Self-pay | Admitting: Internal Medicine

## 2010-09-14 NOTE — Assessment & Plan Note (Signed)
Summary: ACUTE-ONE MONTH F/U (VEGA)   Vital Signs:  Patient profile:   60 year old female Height:      64.5 inches (163.83 cm) Weight:      276.6 pounds (125.73 kg) BMI:     46.91 Temp:     97.6 degrees F (36.44 degrees C) oral Pulse rate:   80 / minute BP sitting:   104 / 66  (left arm) Cuff size:   regular  Vitals Entered By: Theotis Barrio NT II (March 11, 2010 10:01 AM) CC: PATIENT STATES SHE IS HERE FOR ROUTINE OFFICE VISIT / DM /  MEDICATION REFILL Is Patient Diabetic? Yes Did you bring your meter with you today? UNABLE TO DOWN LOAD TYE Pain Assessment Patient in pain? no      Nutritional Status BMI of > 30 = obese  Have you ever been in a relationship where you felt threatened, hurt or afraid?No   Does patient need assistance? Functional Status Self care Ambulation Normal Comments ROUTINE OFFICE VISIT  WITH MEDICATION REFILL    Primary Care Provider:  Laren Everts MD  CC:  PATIENT STATES SHE IS HERE FOR ROUTINE OFFICE VISIT / DM /  MEDICATION REFILL.  History of Present Illness: 60 y/o female with pmh as described in the EMR; who comes for routine visit after having her medications for diabetes and depression adjusted last month.  she is currently w/ocomplaints, reports her sugar is better control after her insulin was increased and also has a decrease in craving for sugar.  Patient forgot her meter and we are unable to download it, CBG in the office was  237  Patient is also more energetic and with improved mood; currently taking prozac 20mg  daily. Patient is not crying anymore and is feeling a whole better.  Patient is compliant with medications and is planning to follow with Lupita Leash T. next month.  Preventive Screening-Counseling & Management  Alcohol-Tobacco     Alcohol drinks/day: 0     Smoking Status: never     Passive Smoke Exposure: no  Caffeine-Diet-Exercise     Does Patient Exercise: yes - somtimes     Type of exercise: WALKING  Exercise (avg: min/session): 10     Times/week: 7  Problems Prior to Update: 1)  Blister, Foot  (ICD-917.2) 2)  Chronic Kidney Disease Stage Iii (MODERATE)  (ICD-585.3) 3)  Allergic Rhinitis, Seasonal  (ICD-477.0) 4)  Obesity  (ICD-278.00) 5)  Depressive Disorder  (ICD-311) 6)  Nephrolithiasis  (ICD-592.0) 7)  Unspecified Dental Caries  (ICD-521.00) 8)  Hearing Loss, Tinnitus  (ICD-389.9) 9)  Back Pain  (ICD-724.5) 10)  Diabetes Mellitus, Type II  (ICD-250.00) 11)  Psoriasis  (ICD-696.1) 12)  Hyperlipidemia  (ICD-272.4) 13)  Hypothyroidism  (ICD-244.9) 14)  Hypertension  (ICD-401.9)  Current Problems (verified): 1)  Blister, Foot  (ICD-917.2) 2)  Chronic Kidney Disease Stage Iii (MODERATE)  (ICD-585.3) 3)  Allergic Rhinitis, Seasonal  (ICD-477.0) 4)  Obesity  (ICD-278.00) 5)  Depressive Disorder  (ICD-311) 6)  Nephrolithiasis  (ICD-592.0) 7)  Unspecified Dental Caries  (ICD-521.00) 8)  Hearing Loss, Tinnitus  (ICD-389.9) 9)  Back Pain  (ICD-724.5) 10)  Diabetes Mellitus, Type II  (ICD-250.00) 11)  Psoriasis  (ICD-696.1) 12)  Hyperlipidemia  (ICD-272.4) 13)  Hypothyroidism  (ICD-244.9) 14)  Hypertension  (ICD-401.9)  Medications Prior to Update: 1)  Synthroid 175 Mcg Tabs (Levothyroxine Sodium) .... Take 1 Tablet By Mouth Once A Day 2)  Lisinopril-Hydrochlorothiazide 20-25 Mg Tabs (Lisinopril-Hydrochlorothiazide) .... Take 1 Tablet By  Mouth Once A Day 3)  Aspirin 81 Mg Chew (Aspirin) .... Take 1 Tablet By Mouth Once A Day 4)  Pravastatin Sodium 20 Mg  Tabs (Pravastatin Sodium) .... Take 1 Tablet By Mouth At Bedtime 5)  Bd Insulin Syringe Half-Unit 31g X 5/16" 0.3 Ml  Misc (Insulin Syringe-Needle U-100) .... To Inject Insulin Once A Day. 6)  Cetirizine Hcl 10 Mg Tabs (Cetirizine Hcl) .... Take 1 Tablet By Mouth Once A Day For Allergies 7)  Truetrack Test  Strp (Glucose Blood) .... Use To Check Blood Sugar Once Daily With 2 Weekly Profiles Before Meal Snd 2 Hours After  Meals 8)  Insulin Syringe 31g X 5/16" 0.5 Ml Misc (Insulin Syringe-Needle U-100) .... To Inject Insulin Once Daily 9)  Lancets  Misc (Lancets) .... Use To Check Blood Sugar Daily With 2 Weekly Profiles 10)  Novolin 70/30 70-30 % Susp (Insulin Isophane & Regular) .... Take 65 Units Subcutaneously 30 Minutes Before Breakfast and 40 Units Subcutaneously 30 Minutes Before Dinner 11)  Fluoxetine Hcl 10 Mg Caps (Fluoxetine Hcl) .... Take 1 Tablet By Mouth Once A Day, Increase To 2 Tablet By Mouth Daily After 2 Weeks  Current Medications (verified): 1)  Synthroid 175 Mcg Tabs (Levothyroxine Sodium) .... Take 1 Tablet By Mouth Once A Day 2)  Lisinopril-Hydrochlorothiazide 20-25 Mg Tabs (Lisinopril-Hydrochlorothiazide) .... Take 1 Tablet By Mouth Once A Day 3)  Aspirin 81 Mg Chew (Aspirin) .... Take 1 Tablet By Mouth Once A Day 4)  Pravastatin Sodium 20 Mg  Tabs (Pravastatin Sodium) .... Take 1 Tablet By Mouth At Bedtime 5)  Bd Insulin Syringe Half-Unit 31g X 5/16" 0.3 Ml  Misc (Insulin Syringe-Needle U-100) .... To Inject Insulin Once A Day. 6)  Cetirizine Hcl 10 Mg Tabs (Cetirizine Hcl) .... Take 1 Tablet By Mouth Once A Day For Allergies 7)  Truetrack Test  Strp (Glucose Blood) .... Use To Check Blood Sugar Once Daily With 2 Weekly Profiles Before Meal Snd 2 Hours After Meals 8)  Insulin Syringe 31g X 5/16" 0.5 Ml Misc (Insulin Syringe-Needle U-100) .... To Inject Insulin Once Daily 9)  Lancets  Misc (Lancets) .... Use To Check Blood Sugar Daily With 2 Weekly Profiles 10)  Novolin 70/30 70-30 % Susp (Insulin Isophane & Regular) .... Take 65 Units Subcutaneously 30 Minutes Before Breakfast and 40 Units Subcutaneously 30 Minutes Before Dinner 11)  Fluoxetine Hcl 10 Mg Caps (Fluoxetine Hcl) .... Take 1 Tablet By Mouth Once A Day, Increase To 2 Tablet By Mouth Daily After 2 Weeks  Allergies (verified): No Known Drug Allergies  Past History:  Past Medical History: Last updated: 01/07/2009 DIABETES  MELLITUS, type 2 HYPERTENSION HYPOTHYROIDISM LEFT EYE CATARACT (Dr. 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 DEPRESSION, mild    - better on nortriptyline OBESITY HYPERPLASTIC POLYP, in 2001    - f/u colonoscopy 11/23/2006 was normal (Dr. Leone Payor)    - falls into normal risk screening (next colonoscopy due in 2018) PAP SMEARS    - 12/14/2005: normal, evidence of Candida    - 05/16/2007: normal, benign reparative changes  Family History: Last updated: 03/05/2008 Both parents  and brother passed away.  Social History: Last updated: 01/07/2009 Married (2nd marriage). Husband awaiting  back surgery. BE AWARE THAT HUSBAND BROKE NARCOTIC CONTRACT WITH THE [employer] SEVERAL TIMES ! 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 She used to work at an [employer] center. Never Smoked. Alcohol use-no Son (23 y/o unemployed) and grandson live with her. The grandson's mother basically dropped the child off when he was 86 y/o. They rarely hear from her. They moved to an apartment from their house in 12/2008.  Risk Factors: Alcohol Use: 0 (03/11/2010) Exercise: yes - somtimes (03/11/2010)  Risk Factors: Smoking Status: never (03/11/2010) Passive Smoke Exposure: no (03/11/2010)  Review of Systems  The patient denies anorexia, fever, chest pain, syncope, hemoptysis, abdominal pain, melena, hematochezia, and severe indigestion/heartburn.    Physical Exam  General:  obese, cooperativealert and well-hydrated.   Lungs:  Normal respiratory effort, chest expands symmetrically. Lungs are clear to  auscultation, no crackles or wheezes. Heart:  normal rate, regular rhythm, no murmur, and no JVD.   Abdomen:  soft, non-tender, normal bowel sounds, no distention, and no masses.   Msk:  normal ROM, no joint tenderness, no joint swelling, no joint warmth, and no redness over joints.   Extremities:  trace left pedal edema and trace right pedal edema.   Neurologic:  alert & oriented X3, cranial nerves II-XII intact, and gait normal.     Impression & Recommendations:  Problem # 1:  DEPRESSIVE DISORDER (ICD-311) Much better and improved. Patient is sleeping better, feels more energetic, better concentration and w/o SI or hallucinations. Willcontinue prozac 20mg  daily and will encourage patient to follow with Lupita Leash T. to set up some Psychotherapy as well.  Her updated medication list for this problem includes:    Fluoxetine Hcl 20 Mg Caps (Fluoxetine hcl) .Marland Kitchen... Take 1 tablet by mouth once a day  Problem # 2:  DIABETES MELLITUS, TYPE II (ICD-250.00) CBG 237 today;patient forgot meter and her insulin was just recently adjusted; will wait until next month to perform A1C again and to adjust her insulin more. Patient advise to bring glucometer with her, to be compliant with her medications and tofollow a low carbohydrates diet.  Her updated medication list for this problem includes:    Lisinopril-hydrochlorothiazide 20-25 Mg Tabs (Lisinopril-hydrochlorothiazide) .Marland Kitchen... Take 1 tablet by mouth once a day    Aspirin 81 Mg Chew (Aspirin) .Marland Kitchen... Take 1 tablet by mouth once a day    Novolin 70/30 70-30 % Susp (Insulin isophane & regular) .Marland Kitchen... Take 65 units subcutaneously 30 minutes before breakfast and 40 units subcutaneously 30 minutes before dinner  Problem # 3:  HYPERLIPIDEMIA (ICD-272.4) Well controlled; last LDL92. She will need lipid profile during next month and t that time further adjustment to her statins can be made. Patient advise to follow a low fat diet.  Her updated medication list for this  problem includes:    Pravastatin Sodium 20 Mg Tabs (Pravastatin sodium) .Marland Kitchen... Take 1 tablet by mouth at bedtime  Problem # 4:  HYPERTENSION (ICD-401.9) At goal; no medications changes are needed. Patient advise to follow a low sodium diet. Patient will need BMET during next visit to follow renal function and electrolytes.  Her updated medication list for this problem includes:    Lisinopril-hydrochlorothiazide 20-25 Mg Tabs (Lisinopril-hydrochlorothiazide) .Marland Kitchen... Take 1 tablet by mouth once a day  BP today: 104/66 Prior BP: 125/83 (01/18/2010)  Labs Reviewed: K+: 4.5 (  01/04/2010) Creat: : 1.53 (01/04/2010)   Chol: 177 (04/22/2009)   HDL: 50 (04/22/2009)   LDL: 92 (04/22/2009)   TG: 173 (04/22/2009)  Problem # 5:  HYPOTHYROIDISM (ICD-244.9) Stable and well controlled. Will continue synthroid same dose.  Her updated medication list for this problem includes:    Synthroid 175 Mcg Tabs (Levothyroxine sodium) .Marland Kitchen... Take 1 tablet by mouth once a day  Labs Reviewed: TSH: 0.885 (01/04/2010)    Complete Medication List: 1)  Synthroid 175 Mcg Tabs (Levothyroxine sodium) .... Take 1 tablet by mouth once a day 2)  Lisinopril-hydrochlorothiazide 20-25 Mg Tabs (Lisinopril-hydrochlorothiazide) .... Take 1 tablet by mouth once a day 3)  Aspirin 81 Mg Chew (Aspirin) .... Take 1 tablet by mouth once a day 4)  Pravastatin Sodium 20 Mg Tabs (Pravastatin sodium) .... Take 1 tablet by mouth at bedtime 5)  Bd Insulin Syringe Half-unit 31g X 5/16" 0.3 Ml Misc (Insulin syringe-needle u-100) .... To inject insulin once a day. 6)  Cetirizine Hcl 10 Mg Tabs (Cetirizine hcl) .... Take 1 tablet by mouth once a day for allergies 7)  Truetrack Test Strp (Glucose blood) .... Use to check blood sugar once daily with 2 weekly profiles before meal snd 2 hours after meals 8)  Insulin Syringe 31g X 5/16" 0.5 Ml Misc (Insulin syringe-needle u-100) .... To inject insulin once daily 9)  Lancets Misc (Lancets) .... Use to  check blood sugar daily with 2 weekly profiles 10)  Novolin 70/30 70-30 % Susp (Insulin isophane & regular) .... Take 65 units subcutaneously 30 minutes before breakfast and 40 units subcutaneously 30 minutes before dinner 11)  Fluoxetine Hcl 20 Mg Caps (Fluoxetine hcl) .... Take 1 tablet by mouth once a day  Patient Instructions: 1)  Please schedule a follow-up appointment in 1 month. 2)  If you have any problems, including worsening depression or thoughts of hurting yourself, call clinic or hotline or 911 or go to ED.  3)  Follow a low sodium diet,a low fat and also a low carbohydrates diet. 4)  It is important that you exercise regularly at least 20 minutes 5 times a week. 5)  You need to lose weight. Consider a lower calorie diet and regular exercise.  6)  Check your blood sugars regularly. If your readings are usually above 250 or below 70 you should contact our office. 7)  Bring your meter next month for your appointmnet. Prescriptions: FLUOXETINE HCL 20 MG CAPS (FLUOXETINE HCL) Take 1 tablet by mouth once a day  #31 x 3   Entered and Authorized by:   Vassie Loll MD   Signed by:   Vassie Loll MD on 03/11/2010   Method used:   Electronically to        [facility]       [address]       [place], [place]  [address]       Ph: [phone]       Fax: [phone]   RxID:   [id number]

## 2010-09-14 NOTE — Progress Notes (Signed)
Summary: Soc. 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 Work Call placed to: Patient Summary of Call: Called patient in response to MD referral and appmt made for Tuesday June 21st for SW assessment and counseling.      Appended Document: Soc. Work Time is 10:00 AM

## 2010-09-14 NOTE — Miscellaneous (Signed)
Summary: SW Appmt Cancelled  SW appmt was cancelled by patient due to car issues.  I left a message for her to call SW and reschedule her appmt.   Appended Document: Soc. Work Appmt Cancelled by Patient Rescheduled to Thursday  June 30th at 9:30.

## 2010-09-14 NOTE — Miscellaneous (Signed)
Summary: [facility]  [facility]   Imported By: Margie Billet 01/13/2010 14:03:21  _____________________________________________________________________  External Attachment:    Type:   Image     Comment:   External Document

## 2010-09-14 NOTE — Assessment & Plan Note (Signed)
Summary: RECK MEDS/CFB PER PT/CFB   Vital Signs:  Patient profile:   60 year old female Height:      64.5 inches Weight:      272.0 pounds BMI:     46.13 Temp:     97.7 degrees F oral Pulse rate:   96 / minute BP sitting:   116 / 76  (right arm)  Vitals Entered By: Filomena Jungling NT II (September 04, 2009 11:41 AM) CC: follow-up visit Is Patient Diabetic? Yes Did you bring your meter with you today? No Pain Assessment Patient in pain? no      Nutritional Status BMI of > 30 = obese CBG Result 234  Have you ever been in a relationship where you felt threatened, hurt or afraid?No   Does patient need assistance? Functional Status Self care Ambulation Normal   Diabetic Foot Exam Last Podiatry Exam Date: 01/07/2009 Foot Inspection Is there a history of a foot ulcer?              No Is there a foot ulcer now?              No Can the patient see the bottom of their feet?          Yes Are the shoes appropriate in style and fit?          Yes Is there swelling or an abnormal foot shape?          No Are the toenails long?                No Are the toenails thick?                No Are the toenails ingrown?              No Is there heavy callous build-up?              No Is there pain in the calf muscle (Intermittent claudication) when walking?    NoIs there a claw toe deformity?              No Is there elevated skin temperature?            No Is there limited ankle dorsiflexion?            No Is there foot or ankle muscle weakness?            No  Diabetic Foot Care Education    10-g (5.07) Semmes-Weinstein Monofilament Test Performed by: Filomena Jungling NT II          Right Foot          Left Foot Site 1         normal         normal Site 2         normal         normal Site 3         normal         normal Site 4         normal         normal Site 5         normal         normal Site 6         normal         normal Site 7         normal         normal Site 8  normal          normal Site 9         normal         normal  Impression      normal         normal   Primary Care Provider:  Laren Everts MD  CC:  follow-up visit.  History of Present Illness: 60 yo woman with PMH of DM, HTN, CKD and left ureteral stone who comes to the clinic for regular f/u for her diabetes. Patient reports that she has been using her insulin and restarted metformin in 05/2010. Her CBG still runs 150 to 240, and most of the time CBG high in the evening. She has no other complaints and did not bring her glucometer with her this time.  Denies diarrhea or dysuria, good appetite.  No significant change for her weight. She will have a scheduled eye exam soon.  Problems Prior to Update: 1)  Blister, Foot  (ICD-917.2) 2)  Chronic Kidney Disease Stage Iii (MODERATE)  (ICD-585.3) 3)  Allergic Rhinitis, Seasonal  (ICD-477.0) 4)  Obesity  (ICD-278.00) 5)  Depressive Disorder  (ICD-311) 6)  Nephrolithiasis  (ICD-592.0) 7)  Unspecified Dental Caries  (ICD-521.00) 8)  Hearing Loss, Tinnitus  (ICD-389.9) 9)  Back Pain  (ICD-724.5) 10)  Diabetes Mellitus, Type II  (ICD-250.00) 11)  Psoriasis  (ICD-696.1) 12)  Hyperlipidemia  (ICD-272.4) 13)  Hypothyroidism  (ICD-244.9) 14)  Hypertension  (ICD-401.9)  Medications Prior to Update: 1)  Synthroid 175 Mcg Tabs (Levothyroxine Sodium) .... Take 1 Tablet By Mouth Once A Day 2)  Lisinopril 40 Mg Tabs (Lisinopril) .... Take 1 Tablet By Mouth Once A Day 3)  Hydrochlorothiazide 25 Mg Tabs (Hydrochlorothiazide) .... Take 1 Tablet By Mouth Once A Day 4)  Aspirin 81 Mg Chew (Aspirin) .... Take 1 Tablet By Mouth Once A Day 5)  Pravastatin Sodium 20 Mg  Tabs (Pravastatin Sodium) .... Take 1 Tablet By Mouth At Bedtime 6)  Bd Insulin Syringe Half-Unit 31g X 5/16" 0.3 Ml  Misc (Insulin Syringe-Needle U-100) .... To Inject Insulin Once A Day. 7)  Cetirizine Hcl 10 Mg Tabs (Cetirizine Hcl) .... Take 1 Tablet By Mouth Once A Day For Allergies 8)   Truetrack Test  Strp (Glucose Blood) .... Use To Check Blood Sugar Once Daily With 2 Weekly Profiles Before Meal Snd 2 Hours After Meals 9)  Insulin Syringe 31g X 5/16" 0.5 Ml Misc (Insulin Syringe-Needle U-100) .... To Inject Insulin Once Daily 10)  Lancets  Misc (Lancets) .... Use To Check Blood Sugar Daily With 2 Weekly Profiles 11)  Nortriptyline Hcl 50 Mg Caps (Nortriptyline Hcl) .... Take 2 Tabs By Mouth At Bedtime Starting 02/06/2009. 12)  Novolin 70/30 70-30 % Susp (Insulin Isophane & Regular) .... Take 50 Units Subcutaneously 30 Minutes Before Breakfast and 30 Units Subcutaneously 30 Minutes Before Dinner 13)  Metformin Hcl 500 Mg Tabs (Metformin Hcl) .... Take 1 Tablet By Mouth Two Times A Day 14)  Amoxicillin 500 Mg Caps (Amoxicillin) .... Take 1 Tablet By Mouth Three Times Day  Current Medications (verified): 1)  Synthroid 175 Mcg Tabs (Levothyroxine Sodium) .... Take 1 Tablet By Mouth Once A Day 2)  Lisinopril 40 Mg Tabs (Lisinopril) .... Take 1 Tablet By Mouth Once A Day 3)  Hydrochlorothiazide 25 Mg Tabs (Hydrochlorothiazide) .... Take 1 Tablet By Mouth Once A Day 4)  Aspirin 81 Mg Chew (Aspirin) .... Take 1 Tablet By Mouth Once A Day 5)  Pravastatin  Sodium 20 Mg  Tabs (Pravastatin Sodium) .... Take 1 Tablet By Mouth At Bedtime 6)  Bd Insulin Syringe Half-Unit 31g X 5/16" 0.3 Ml  Misc (Insulin Syringe-Needle U-100) .... To Inject Insulin Once A Day. 7)  Cetirizine Hcl 10 Mg Tabs (Cetirizine Hcl) .... Take 1 Tablet By Mouth Once A Day For Allergies 8)  Truetrack Test  Strp (Glucose Blood) .... Use To Check Blood Sugar Once Daily With 2 Weekly Profiles Before Meal Snd 2 Hours After Meals 9)  Insulin Syringe 31g X 5/16" 0.5 Ml Misc (Insulin Syringe-Needle U-100) .... To Inject Insulin Once Daily 10)  Lancets  Misc (Lancets) .... Use To Check Blood Sugar Daily With 2 Weekly Profiles 11)  Nortriptyline Hcl 50 Mg Caps (Nortriptyline Hcl) .... Take 2 Tabs By Mouth At Bedtime Starting  02/06/2009. 12)  Novolin 70/30 70-30 % Susp (Insulin Isophane & Regular) .... Take 60 Units Subcutaneously 30 Minutes Before Breakfast and 35 Units Subcutaneously 30 Minutes Before Dinner  Allergies (verified): No Known Drug Allergies  Past History:  Past Medical History: Last updated: 01/07/2009 DIABETES MELLITUS, type 2 HYPERTENSION HYPOTHYROIDISM LEFT EYE CATARACT (Dr. 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 DEPRESSION, mild    - better on nortriptyline OBESITY HYPERPLASTIC POLYP, in 2001    - f/u colonoscopy 11/23/2006 was normal (Dr. Leone Payor)    - falls into normal risk screening (next colonoscopy due in 2018) PAP SMEARS    - 12/14/2005: normal, evidence of Candida    - 05/16/2007: normal, benign reparative changes  Family History: Last updated: 03/05/2008 Both parents and brother passed away.  Social History: Last updated: 01/07/2009 Married (2nd marriage). Husband awaiting  back surgery. BE AWARE THAT HUSBAND BROKE NARCOTIC CONTRACT WITH THE [employer] SEVERAL TIMES ! 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 She used to work at an [employer] center. Never Smoked. Alcohol use-no Son (48 y/o unemployed) and grandson live with her. The grandson's mother basically dropped the child off when he was 31 y/o. They rarely hear from her. They moved to an apartment from their house in 12/2008.  Risk Factors: Smoking Status: never (06/25/2009) Passive Smoke Exposure: no (06/25/2009)  Family History: Reviewed history from 03/05/2008 and no changes required. Both parents and  brother passed away.  Social History: Reviewed history from 01/07/2009 and no changes required. Married (2nd marriage). Husband awaiting  back surgery. BE AWARE THAT HUSBAND BROKE NARCOTIC CONTRACT WITH THE [employer] SEVERAL TIMES ! 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 She used to work at an [employer] center. Never Smoked. Alcohol use-no Son (17 y/o unemployed) and grandson live with her. The grandson's mother basically dropped the child off when he was 15 y/o. They rarely hear from her. They moved to an apartment from their house in 12/2008.  Review of Systems  The patient denies anorexia, fever, decreased hearing, chest pain, syncope, dyspnea on exertion, peripheral edema, headaches, abdominal pain, melena, difficulty walking, and unusual weight change.    Physical Exam  General:  alert, well-developed, well-nourished, well-hydrated, and overweight-appearing.   Head:  normocephalic.   Eyes:  vision grossly intact.   Ears:  no external deformities.   Nose:  no external erythema.   Mouth:  pharynx pink and moist.   Neck:  supple.   Chest Wall:  no deformities.   Lungs:  normal respiratory effort, normal breath sounds, no crackles, and no wheezes.   Heart:  normal rate, regular rhythm, no murmur, and no JVD.   Abdomen:  soft, non-tender, normal bowel sounds, no distention, and no masses.   Msk:  normal ROM, no joint tenderness, no joint swelling, no joint warmth, and no redness over joints.   Pulses:  2+ Extremities:  No edema. Neurologic:  alert & oriented X3, cranial nerves II-XII intact, strength normal in all extremities, and gait normal.    Diabetes Management Exam:    Foot Exam (with socks and/or shoes not present):       Sensory-Monofilament:          Left foot: normal          Right foot: normal   Impression & Recommendations:  Problem # 1:  DIABETES MELLITUS, TYPE II  (ICD-250.00) Assessment Unchanged Her current A1C 9.8, still not well controlled. Considering her stage III CKD, we are concerned with metformin associated lactic acidosis, will discontinue it. In the meantime will increase her insulin dose, particularly in the morning because her CBG usually runs high in the evening. Advised her to continue weight loss and exercise. Will return to check in 3 months. She will have eye exam soon.  The following medications were removed from the medication list:    Metformin Hcl 500 Mg Tabs (Metformin hcl) .Marland Kitchen... Take 1 tablet by mouth two times a day Her updated medication list for this problem includes:    Lisinopril 40 Mg Tabs (Lisinopril) .Marland Kitchen... Take 1 tablet by mouth once a day    Aspirin 81 Mg Chew (Aspirin) .Marland Kitchen... Take 1 tablet by mouth once a day    Novolin 70/30 70-30 % Susp (Insulin isophane & regular) .Marland Kitchen... Take 60 units subcutaneously 30 minutes before breakfast and 35 units subcutaneously 30 minutes before dinner  Orders: T- Capillary Blood Glucose (04540) T-Hgb A1C (in-house) ([id number]) T-Basic Metabolic Panel ([id number])  Labs Reviewed: Creat: 1.59 (06/12/2009)     Last Eye Exam: No diabetic retinopathy.   Visual acuity OD (best corrected):20/20 Visual acuity OS (best corrected):20/20  (05/15/2008) Reviewed HgBA1c results: 9.8 (09/04/2009)  9.5 (04/22/2009)  Problem # 2:  HYPERTENSION (ICD-401.9) Assessment: Unchanged Well controlled and will continue current regimen.  Her updated medication list for this problem includes:    Lisinopril 40 Mg Tabs (Lisinopril) .Marland Kitchen... Take 1 tablet by mouth once a day    Hydrochlorothiazide 25 Mg Tabs (Hydrochlorothiazide) .Marland Kitchen... Take 1 tablet by mouth once a day  Orders: T-Basic Metabolic Panel [id number])  BP today: 116/76 Prior BP: 111/77 (06/25/2009)  Labs Reviewed: K+: 4.4 (06/12/2009) Creat: : 1.59 (06/12/2009)   Chol: 177 (04/22/2009)   HDL: 50 (04/22/2009)   LDL: 92 (04/22/2009)   TG: 173  (04/22/2009)  Problem # 3:  CHRONIC KIDNEY DISEASE STAGE III (MODERATE) (ICD-585.3) Assessment: Unchanged She has CKD and will recheck BMET for her Cr.  Orders: T-Basic Metabolic Panel [id number])  Problem # 4:  NEPHROLITHIASIS (ICD-592.0) Assessment: Unchanged Will [facility]u at [facility].  Problem # 5:  HYPERLIPIDEMIA (ICD-272.4) Assessment: Improved At goal and will continue pravastatin and weight loss, healthy diet. Her  updated medication list for this problem includes:    Pravastatin Sodium 20 Mg Tabs (Pravastatin sodium) .Marland Kitchen... Take 1 tablet by mouth at bedtime  Labs Reviewed: SGOT: 37 (04/22/2009)   SGPT: 34 (04/22/2009)   HDL:50 (04/22/2009), 47 (12/15/2008)  LDL:92 (04/22/2009), 105 (16/05/9603)  Chol:177 (04/22/2009), 214 (12/15/2008)  Trig:173 (04/22/2009), 310 (12/15/2008)  Complete Medication List: 1)  Synthroid 175 Mcg Tabs (Levothyroxine sodium) .... Take 1 tablet by mouth once a day 2)  Lisinopril 40 Mg Tabs (Lisinopril) .... Take 1 tablet by mouth once a day 3)  Hydrochlorothiazide 25 Mg Tabs (Hydrochlorothiazide) .... Take 1 tablet by mouth once a day 4)  Aspirin 81 Mg Chew (Aspirin) .... Take 1 tablet by mouth once a day 5)  Pravastatin Sodium 20 Mg Tabs (Pravastatin sodium) .... Take 1 tablet by mouth at bedtime 6)  Bd Insulin Syringe Half-unit 31g X 5/16" 0.3 Ml Misc (Insulin syringe-needle u-100) .... To inject insulin once a day. 7)  Cetirizine Hcl 10 Mg Tabs (Cetirizine hcl) .... Take 1 tablet by mouth once a day for allergies 8)  Truetrack Test Strp (Glucose blood) .... Use to check blood sugar once daily with 2 weekly profiles before meal snd 2 hours after meals 9)  Insulin Syringe 31g X 5/16" 0.5 Ml Misc (Insulin syringe-needle u-100) .... To inject insulin once daily 10)  Lancets Misc (Lancets) .... Use to check blood sugar daily with 2 weekly profiles 11)  Nortriptyline Hcl 50 Mg Caps (Nortriptyline hcl) .... Take 2 tabs by mouth at bedtime  starting 02/06/2009. 12)  Novolin 70/30 70-30 % Susp (Insulin isophane & regular) .... Take 60 units subcutaneously 30 minutes before breakfast and 35 units subcutaneously 30 minutes before dinner  Patient Instructions: 1)  Please schedule a follow-up appointment in 3 months. 2)  Please bring your glucometer with you at next visit. 3)  We have changed your insulin dose of 60 units  at breakfast and 35 units at dinner.  Prevention & Chronic Care Immunizations   Influenza vaccine: Fluvax 3+  (05/13/2009)    Tetanus booster: Not documented    Pneumococcal vaccine: Not documented  Colorectal Screening   Hemoccult: Not documented   Hemoccult action/deferral: Not indicated  (04/27/2009)    Colonoscopy: Normal  (01/27/2000)   Colonoscopy action/deferral: Not indicated  (04/27/2009)  Other Screening   Pap smear: Not documented    Mammogram: No specific mammographic evidence of malignancy.  Assessment: BIRADS 1.   (06/25/2008)   Mammogram action/deferral: Screening mammogram in 1 year.     (06/24/2008)   Smoking status: never  (06/25/2009)  Diabetes Mellitus   HgbA1C: 9.8  (09/04/2009)    Eye exam: No diabetic retinopathy.   Visual acuity OD (best corrected):20/20 Visual acuity OS (best corrected):20/20   (05/15/2008)    Foot exam: yes  (09/04/2009)   Foot exam action/deferral: Do today   High risk foot: Not documented   Foot care education: Not documented    Urine microalbumin/creatinine ratio: 52.1  (04/22/2009)   Urine microalbumin action/deferral: Ordered    Diabetes flowsheet reviewed?: Yes   Progress toward A1C goal: Unchanged  Lipids   Total Cholesterol: 177  (04/22/2009)   LDL: 92  (04/22/2009)   LDL Direct: Not documented   HDL: 50  (04/22/2009)   Triglycerides: 173  (04/22/2009)    SGOT (AST): 37  (04/22/2009)   SGPT (ALT): 34  (04/22/2009)   Alkaline phosphatase: 82  (04/22/2009)   Total bilirubin: 0.4  (04/22/2009)    Lipid flowsheet  reviewed?:  Yes   Progress toward LDL goal: At goal  Hypertension   Last Blood Pressure: 116 / 76  (09/04/2009)   Serum creatinine: 1.59  (06/12/2009)   Serum potassium 4.4  (06/12/2009)    Hypertension flowsheet reviewed?: Yes   Progress toward BP goal: At goal  Self-Management Support :   Personal Goals (by the next clinic visit) :     Personal A1C goal: 7  (04/27/2009)     Personal blood pressure goal: 140/90  (04/27/2009)     Personal LDL goal: 70  (04/27/2009)    Diabetes self-management support: Written self-care plan  (09/04/2009)   Diabetes care plan printed   Last diabetes self-management training by diabetes educator: 01/07/2009    Hypertension self-management support: Written self-care plan  (09/04/2009)   Hypertension self-care plan printed.    Lipid self-management support: Written self-care plan  (09/04/2009)   Lipid self-care plan printed.   Nursing Instructions: Diabetic foot exam today Give Pneumovax today Refer for screening diabetic eye exam (see order) Refer for screening diabetic eye exam (see order)   Laboratory Results   Blood Tests   Date/Time Received: September 04, 2009 12:08 PM  Date/Time Reported: Burke Keels  September 04, 2009 12:08 PM   HGBA1C: 9.8%   (Normal Range: Non-Diabetic - 3-6%   Control Diabetic - 6-8%) CBG Random:: 234mg /dL     Process Orders Check Orders Results:     [facility]: ABN not required for this insurance Tests Sent for requisitioning (September 05, 2009 2:49 PM):     09/04/2009: [facility] -- T-Basic Metabolic Panel [phone] (signed)

## 2010-09-14 NOTE — Progress Notes (Signed)
Summary: med refill/gp  Phone Note Refill Request Message from:  Fax from Pharmacy on May 26, 2010 10:53 AM  Refills Requested: Medication #1:  PRAVASTATIN SODIUM 20 MG  TABS Take 1 tablet by mouth at bedtime   Last Refilled: 05/02/2010 Last appt. 03/11/10  Last CMP 5/23.   Method Requested: Electronic Initial call taken by: Chinita Pester RN,  May 26, 2010 10:53 AM    Prescriptions: PRAVASTATIN SODIUM 20 MG  TABS (PRAVASTATIN SODIUM) Take 1 tablet by mouth at bedtime  #90 x 4   Entered and Authorized by:   Zoila Shutter MD   Signed by:   Zoila Shutter MD on 05/27/2010   Method used:   Electronically to        [facility]       [address]       [place], [place]  [address]       Ph: [phone]       Fax: [phone]   RxID:   [id number]

## 2010-09-14 NOTE — Consult Note (Signed)
[facility]   Imported By: Florinda Marker 09/17/2009 09:28:27  _____________________________________________________________________  External Attachment:    Type:   Image     Comment:   External Document

## 2010-09-14 NOTE — Assessment & Plan Note (Signed)
Summary: ACUTE/VEGA/2 WEEK RECHECK/CH   Vital Signs:  Patient profile:   60 year old female Height:      64.5 inches (163.83 cm) Weight:      276.03 pounds (125.47 kg) BMI:     46.82 Temp:     97.5 degrees F (36.39 degrees C) oral Pulse rate:   96 / minute BP sitting:   125 / 83  (right arm)  Vitals Entered By: Angelina Ok RN (January 18, 2010 2:51 PM) CC: Depression Is Patient Diabetic? Yes Did you bring your meter with you today? Yes Pain Assessment Patient in pain? yes     Location: back Intensity: 9 Type: burning, shooting Onset of pain  With activity Nutritional Status BMI of > 30 = obese CBG Result 266  Have you ever been in a relationship where you felt threatened, hurt or afraid?No   Does patient need assistance? 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  Has not gotten the Neurontin.  Wants something else.  Has been backing off the Nortriptllene   Primary Care Provider:  Laren Everts MD  CC:  Depression.  History of Present Illness: Tami Elliott is a 60 yo woman with PMH as outlined below.  She is here for a few reasons:  1.  hidradrinitis:  completed doxy, resolved.  2.  BP:  has started combo (HCTZ-lisinopril) for at least 1 week  3.  DM:  before breakfast:  157 (mainly >200) to 378              before dinner:  151 (mainly >200) to 393  4.  Depression:  nortryptiline being tapered with plan for celexa per Dr. Cena Benton.  Reports feeling a bit better.  Has lots of stressors that may be contributing.  No SI/HI.  Depression History:      The patient denies a depressed mood most of the day and a diminished interest in her usual daily activities.        Comments:  Weaning off the pills.   Current Medications (verified): 1)  Synthroid 175 Mcg Tabs (Levothyroxine Sodium) .... Take 1 Tablet By Mouth Once A Day 2)  Lisinopril-Hydrochlorothiazide 20-25 Mg Tabs (Lisinopril-Hydrochlorothiazide) .... Take 1 Tablet  By Mouth Once A Day 3)  Aspirin 81 Mg Chew (Aspirin) .... Take 1 Tablet By Mouth Once A Day 4)  Pravastatin Sodium 20 Mg  Tabs (Pravastatin Sodium) .... Take 1 Tablet By Mouth At Bedtime 5)  Bd Insulin Syringe Half-Unit 31g X 5/16" 0.3 Ml  Misc (Insulin Syringe-Needle U-100) .... To Inject Insulin Once A Day. 6)  Cetirizine Hcl 10 Mg Tabs (Cetirizine Hcl) .... Take 1 Tablet By Mouth Once A Day For Allergies 7)  Truetrack Test  Strp (Glucose Blood) .... Use To Check Blood Sugar Once Daily With 2 Weekly Profiles Before Meal Snd 2 Hours After Meals 8)  Insulin Syringe 31g X 5/16" 0.5 Ml Misc (Insulin Syringe-Needle U-100) .... To Inject Insulin Once Daily 9)  Lancets  Misc (Lancets) .... Use To Check Blood Sugar Daily With 2 Weekly Profiles 10)  Novolin 70/30 70-30 % Susp (Insulin Isophane & Regular) .... Take 60 Units Subcutaneously 30 Minutes Before Breakfast and 35 Units Subcutaneously 30 Minutes Before Dinner  Allergies (verified): No Known Drug Allergies  Past History:  Past Medical History: Last updated: 01/07/2009 DIABETES MELLITUS, type 2 HYPERTENSION HYPOTHYROIDISM LEFT EYE CATARACT (Dr. 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 DEPRESSION, mild    - better on nortriptyline OBESITY HYPERPLASTIC POLYP, in 2001    - f/u colonoscopy 11/23/2006 was normal (Dr. Leone Payor)    - falls into normal risk screening (next colonoscopy due in 2018) PAP SMEARS    - 12/14/2005: normal, evidence of Candida    - 05/16/2007: normal, benign reparative changes  Family History: Last updated: 03/05/2008 Both parents and brother passed away.  Social History: Last updated:  01/07/2009 Married (2nd marriage). Husband awaiting  back surgery. BE AWARE THAT HUSBAND BROKE NARCOTIC CONTRACT WITH THE [employer] SEVERAL TIMES ! 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 She used to work at an [employer] center. Never Smoked. Alcohol use-no Son (9 y/o unemployed) and grandson live with her. The grandson's mother basically dropped the child off when he was 26 y/o. They rarely hear from her. They moved to an apartment from their house in 12/2008.  Risk Factors: Alcohol Use: 0 (01/04/2010) Exercise: yes - somtimes (01/04/2010)  Risk Factors: Smoking Status: never (01/04/2010) Passive Smoke Exposure: no (01/04/2010)  Review of Systems      See HPI  Physical Exam  General:  obese, cooperative Eyes:  anicteric, prior surgery left eye Lungs:  normal respiratory effort and no accessory muscle use.   Neurologic:  alert & oriented X3, cranial nerves II-XII intact, and gait normal.   Psych:  Oriented X3, memory intact for recent and remote, normally interactive, and good eye contact.  No SI/HI   Impression & Recommendations:  Problem # 1:  DEPRESSIVE DISORDER (ICD-311)  will try prozac, on $4 list and finances are a large problem for pt. will also refer to Dorothe Pea given large amount psychosocial stressors  >30 minutes face to face  The following medications were removed from the medication list:    Nortriptyline Hcl 50 Mg Caps (Nortriptyline hcl) .Marland Kitchen... Take 1 tab by mouth at bedtime for one week, then 1/2 tab by mouth at bedtime for one week, then stop taking medication Her updated medication list for this problem includes:    Fluoxetine Hcl 10 Mg Caps (Fluoxetine hcl) .Marland Kitchen... Take 1 tablet by mouth once a day, increase to 2 tablet by mouth daily after 2 weeks  Orders: Social Work Referral (Social )  Problem # 2:  DIABETES MELLITUS, TYPE II (ICD-250.00) Will increase to 65 and  40. continue self monitoring  Her updated medication list for this problem includes:    Lisinopril-hydrochlorothiazide 20-25 Mg Tabs (Lisinopril-hydrochlorothiazide) .Marland Kitchen... Take 1 tablet by mouth once a day    Aspirin 81 Mg Chew (Aspirin) .Marland Kitchen... Take 1 tablet by mouth once a day    Novolin 70/30 70-30 % Susp (Insulin isophane & regular) .Marland Kitchen... Take 65 units subcutaneously 30 minutes before breakfast and 40 units subcutaneously 30 minutes before dinner  Orders: Capillary Blood Glucose/CBG (63875) Ophthalmology Referral (Ophthalmology)  Problem # 3:  HYPERLIPIDEMIA (ICD-272.4) at goal  Her updated medication list for this problem includes:    Pravastatin Sodium 20 Mg Tabs (Pravastatin sodium) .Marland Kitchen... Take 1 tablet by mouth at bedtime  Labs Reviewed: SGOT: 37 (01/04/2010)   SGPT: 40 (01/04/2010)  HDL:50 (04/22/2009), 47 (12/15/2008)  LDL:92 (04/22/2009), 105 (16/05/9603)  Chol:177 (04/22/2009), 214 (12/15/2008)  Trig:173 (04/22/2009), 310 (12/15/2008)  Problem # 4:  HYPERTENSION (ICD-401.9) at goal meds decreased recently, will not check labs today  Her updated medication list for this problem includes:    Lisinopril-hydrochlorothiazide 20-25 Mg Tabs (Lisinopril-hydrochlorothiazide) .Marland Kitchen... Take 1 tablet by mouth once a day  BP today: 125/83 Prior BP: 108/75 (01/04/2010)  Labs Reviewed: K+: 4.5 (01/04/2010) Creat: : 1.53 (01/04/2010)   Chol: 177 (04/22/2009)   HDL: 50 (04/22/2009)   LDL: 92 (04/22/2009)   TG: 173 (04/22/2009)  Problem # 5:  HYPOTHYROIDISM (ICD-244.9)  at goal  Her updated medication list for this problem includes:    Synthroid 175 Mcg Tabs (Levothyroxine sodium) .Marland Kitchen... Take 1 tablet by mouth once a day  Labs Reviewed: TSH: 0.885 (01/04/2010)    HgBA1c: 12.4 (01/04/2010) Chol: 177 (04/22/2009)   HDL: 50 (04/22/2009)   LDL: 92 (04/22/2009)   TG: 173 (04/22/2009)  Problem # 6:  Preventive Health Care (ICD-V70.0) colonoscopy repeated 2008 or so, will obtain  records tetanus today, flu in fall Needs pap on f/u visit  Complete Medication List: 1)  Synthroid 175 Mcg Tabs (Levothyroxine sodium) .... Take 1 tablet by mouth once a day 2)  Lisinopril-hydrochlorothiazide 20-25 Mg Tabs (Lisinopril-hydrochlorothiazide) .... Take 1 tablet by mouth once a day 3)  Aspirin 81 Mg Chew (Aspirin) .... Take 1 tablet by mouth once a day 4)  Pravastatin Sodium 20 Mg Tabs (Pravastatin sodium) .... Take 1 tablet by mouth at bedtime 5)  Bd Insulin Syringe Half-unit 31g X 5/16" 0.3 Ml Misc (Insulin syringe-needle u-100) .... To inject insulin once a day. 6)  Cetirizine Hcl 10 Mg Tabs (Cetirizine hcl) .... Take 1 tablet by mouth once a day for allergies 7)  Truetrack Test Strp (Glucose blood) .... Use to check blood sugar once daily with 2 weekly profiles before meal snd 2 hours after meals 8)  Insulin Syringe 31g X 5/16" 0.5 Ml Misc (Insulin syringe-needle u-100) .... To inject insulin once daily 9)  Lancets Misc (Lancets) .... Use to check blood sugar daily with 2 weekly profiles 10)  Novolin 70/30 70-30 % Susp (Insulin isophane & regular) .... Take 65 units subcutaneously 30 minutes before breakfast and 40 units subcutaneously 30 minutes before dinner 11)  Fluoxetine Hcl 10 Mg Caps (Fluoxetine hcl) .... Take 1 tablet by mouth once a day, increase to 2 tablet by mouth daily after 2 weeks  Patient Instructions: 1)  Please schedule a follow-up appointment in 1 month. 2)  Increase insulin as instructed below. 3)  Start fluoxetine for depression as discussed, may increase to 2 tablet daily after 2 weeks if no side effects 4)  Will have Dorothe Pea call you as discussed. 5)  Will need PAP smear next visit. 6)  Will get tetanus shot today. 7)  If you have any problems, including worsening depression or thoughts of hurting yourself, call clinic or hotline or 911 or go to ED.  Prescriptions: FLUOXETINE HCL 10 MG CAPS (FLUOXETINE HCL) Take 1 tablet by mouth once a day,  increase to 2 tablet by mouth daily after 2 weeks  #45 x 1   Entered and Authorized by:   Mariea Stable MD   Signed by:   Mariea Stable MD on 01/18/2010   Method used:   Print then Give to Patient   RxID:   [id number] NOVOLIN 70/30 70-30 % SUSP (INSULIN ISOPHANE & REGULAR) Take 65 units subcutaneously 30  minutes before breakfast and 40 units subcutaneously 30 minutes before dinner  #3 vials x 3   Entered and Authorized by:   Mariea Stable MD   Signed by:   Mariea Stable MD on 01/18/2010   Method used:   Print then Give to Patient   RxID:   [id number]   Prevention & Chronic Care Immunizations   Influenza vaccine: Fluvax 3+  (05/13/2009)   Influenza vaccine deferral: Not available  (01/18/2010)    Tetanus booster: Not documented    Pneumococcal vaccine: Not documented  Colorectal Screening   Hemoccult: Not documented   Hemoccult action/deferral: Not indicated  (04/27/2009)    Colonoscopy: Normal  (01/27/2000)   Colonoscopy action/deferral: Deferred  (01/04/2010)   Colonoscopy due: 01/26/2010  Other Screening   Pap smear: Not documented   Pap smear action/deferral: Deferred  (01/18/2010)    Mammogram: ASSESSMENT: Negative - BI-RADS 1^MM DIGITAL SCREENING  (09/08/2009)   Mammogram action/deferral: Deferred  (01/04/2010)   Mammogram due: 09/08/2010  Reports requested:   Last colonoscopy report requested.  Smoking status: never  (01/04/2010)  Diabetes Mellitus   HgbA1C: 12.4  (01/04/2010)    Eye exam: No diabetic retinopathy.   Visual acuity OD (best corrected):20/20 Visual acuity OS (best corrected):20/20   (05/15/2008)   Diabetic eye exam action/deferral: Ophthalmology referral  (01/18/2010)    Foot exam: yes  (09/04/2009)   Foot exam action/deferral: Do today   High risk foot: Not documented   Foot care education: Not documented    Urine microalbumin/creatinine ratio: 52.1  (04/22/2009)   Urine microalbumin action/deferral: Ordered     Diabetes flowsheet reviewed?: Yes   Progress toward A1C goal: Unchanged  Lipids   Total Cholesterol: 177  (04/22/2009)   LDL: 92  (04/22/2009)   LDL Direct: Not documented   HDL: 50  (04/22/2009)   Triglycerides: 173  (04/22/2009)    SGOT (AST): 37  (01/04/2010)   SGPT (ALT): 40  (01/04/2010)   Alkaline phosphatase: 96  (01/04/2010)   Total bilirubin: 0.5  (01/04/2010)    Lipid flowsheet reviewed?: Yes   Progress toward LDL goal: At goal  Hypertension   Last Blood Pressure: 125 / 83  (01/18/2010)   Serum creatinine: 1.53  (01/04/2010)   Serum potassium 4.5  (01/04/2010)    Hypertension flowsheet reviewed?: Yes   Progress toward BP goal: At goal  Self-Management Support :   Personal Goals (by the next clinic visit) :     Personal A1C goal: 7  (04/27/2009)     Personal blood pressure goal: 140/90  (04/27/2009)     Personal LDL goal: 70  (04/27/2009)    Patient will work on the following items until the next clinic visit to reach self-care goals:     Medications and monitoring: take my medicines every day, check my blood sugar, bring all of my medications to every visit, examine my feet every day  (01/18/2010)     Eating: drink diet soda or water instead of juice or soda, eat more vegetables, use fresh or frozen vegetables, eat foods that are low in salt, eat baked foods instead of fried foods, eat fruit for snacks and desserts, limit or avoid alcohol  (01/18/2010)     Activity: take a 30 minute walk every day  (01/18/2010)    Diabetes self-management support: Written self-care plan, Education handout, Pre-printed educational material, Resources for patients handout  (01/18/2010)   Diabetes care plan printed   Diabetes education handout printed   Last diabetes self-management training  by diabetes educator: 01/07/2009    Hypertension self-management support: Written self-care plan, Education handout, Pre-printed educational material, Resources for patients handout   (01/18/2010)   Hypertension self-care plan printed.   Hypertension education handout printed    Lipid self-management support: Written self-care plan, Education handout, Pre-printed educational material, Resources for patients handout  (01/18/2010)   Lipid self-care plan printed.   Lipid education handout printed      Resource handout printed.   Nursing Instructions: Give tetanus booster today Request report of last colonoscopy Refer for screening diabetic eye exam (see order)      Vital Signs:  Patient profile:   60 year old female Height:      64.5 inches (163.83 cm) Weight:      276.03 pounds (125.47 kg) BMI:     46.82 Temp:     97.5 degrees F (36.39 degrees C) oral Pulse rate:   96 / minute BP sitting:   125 / 83  (right arm)  Vitals Entered By: Angelina Ok RN (January 18, 2010 2:51 PM)  Appended Document: ACUTE/VEGA/2 WEEK RECHECK/CH   Immunizations Administered:  Tetanus Vaccine:    Vaccine Type: Td    Site: left deltoid    Mfr: San[employer]i Pasteur    Dose: 0.5 ml    Route: IM    Given by: Angelina Ok RN    Exp. Date: 08/28/2011    Lot #: [id number]    VIS given: 07/03/07 version given January 18, 2010.

## 2010-09-14 NOTE — Letter (Signed)
Summary: ABC Sheet  ABC Sheet   Imported By: Florinda Marker 12/15/2008 16:51:30  _____________________________________________________________________  External Attachment:    Type:   Image     Comment:   External Document

## 2010-09-14 NOTE — Progress Notes (Signed)
Summary: refill/gg  Phone Note Refill Request  on May 13, 2010 4:08 PM  Refills Requested: Medication #1:  LISINOPRIL-HYDROCHLOROTHIAZIDE 20-25 MG TABS Take 1 tablet by mouth once a day   Last Refilled: 05/12/2010  Method Requested: Electronic Initial call taken by: Merrie Roof RN,  May 13, 2010 4:08 PM    Prescriptions: LISINOPRIL-HYDROCHLOROTHIAZIDE 20-25 MG TABS (LISINOPRIL-HYDROCHLOROTHIAZIDE) Take 1 tablet by mouth once a day  #30 x 3   Entered and Authorized by:   Laren Everts MD   Signed by:   Laren Everts MD on 05/17/2010   Method used:   Electronically to        [facility] [phone]* (retail)       [address]       [address]       [place], [place]  [address]       Ph: [phone]       Fax: [phone]   RxID:   [id number]

## 2010-09-14 NOTE — Progress Notes (Signed)
Summary: SW/Pt Cancelled Appmt.   Phone Note Outgoing Call   Call placed by: Patient Call placed to: Soc. Work Emergency planning/management officer of Call: Patient called to cancel her SW appmt.  This is the second cancellation.  The patient is welcome to visit with me at anytime or at time of clinic visit.

## 2010-09-14 NOTE — Miscellaneous (Signed)
Summary: AUTHORIZATION HEALTH INFORMATION  AUTHORIZATION HEALTH INFORMATION   Imported By: Margie Billet 11/24/2009 11:04:39  _____________________________________________________________________  External Attachment:    Type:   Image     Comment:   External Document

## 2010-09-14 NOTE — Assessment & Plan Note (Signed)
Summary: est-ck/fu/meds/cfb   Vital Signs:  Patient profile:   60 year old female Height:      64.5 inches (163.83 cm) Weight:      279.0 pounds (126.82 kg) BMI:     47.32 Temp:     97.0 degrees F oral Pulse rate:   97 / minute BP sitting:   108 / 75  (right arm)  Vitals Entered By: Chinita Pester RN (Jan 04, 2010 2:11 PM) CC: F/U visit.  ?Arthritis pain in righgt arm. Check ears-hard of hearing sometime. Burning sensation right thumb. Shooting pain left hip when standing-more frequent., Depression Is Patient Diabetic? Yes Did you bring your meter with you today? No Pain Assessment Patient in pain? yes     Location: left knee/right thumb Intensity: 7 Type: aching Onset of pain  Intermittent Nutritional Status BMI of > 30 = obese CBG Result 286  Have you ever been in a relationship where you felt threatened, hurt or afraid?No   Does patient need assistance? Functional Status Self care Ambulation Normal   Primary Care Provider:  Laren Everts MD  CC:  F/U visit.  ?Arthritis pain in righgt arm. Check ears-hard of hearing sometime. Burning sensation right thumb. Shooting pain left hip when standing-more frequent. and Depression.  History of Present Illness: 60 yr old woman with pmhx as described below comes to the clinic complaining of depression. Patient is dealing with alot of family and financial issues. Has decreased energy, fatigued, and insomnia. Reports that nortriptyline is not helping like it used to.  Patient reports to have a cyst under her left armpit. Has had this before in the past. TTp but no drainage.   Patient also complains that in the last  month she has been feeling sharp pain that is felt from elbow to 4 and 5 fingers. Described as burning. Denies any recent trauma. Has broken elbow twice in the past. Thumb also feels to be burning. Symptoms are felt mostly at night. Denies fever or chills.   Depression History:      The patient is having a  depressed mood most of the day and has a diminished interest in her usual daily activities.        Risk factors for depression include chronic illness.  The patient denies that she feels like life is not worth living, denies that she wishes that she were dead, and denies that she has thought about ending her life.        Comments:  "I cry at anything." Tearful.   Preventive Screening-Counseling & Management  Alcohol-Tobacco     Alcohol drinks/day: 0     Smoking Status: never     Passive Smoke Exposure: no  Caffeine-Diet-Exercise     Does Patient Exercise: yes - somtimes     Type of exercise: WALKING  Problems Prior to Update: 1)  Blister, Foot  (ICD-917.2) 2)  Chronic Kidney Disease Stage Iii (MODERATE)  (ICD-585.3) 3)  Allergic Rhinitis, Seasonal  (ICD-477.0) 4)  Obesity  (ICD-278.00) 5)  Depressive Disorder  (ICD-311) 6)  Nephrolithiasis  (ICD-592.0) 7)  Unspecified Dental Caries  (ICD-521.00) 8)  Hearing Loss, Tinnitus  (ICD-389.9) 9)  Back Pain  (ICD-724.5) 10)  Diabetes Mellitus, Type II  (ICD-250.00) 11)  Psoriasis  (ICD-696.1) 12)  Hyperlipidemia  (ICD-272.4) 13)  Hypothyroidism  (ICD-244.9) 14)  Hypertension  (ICD-401.9)  Medications Prior to Update: 1)  Synthroid 175 Mcg Tabs (Levothyroxine Sodium) .... Take 1 Tablet By Mouth Once A Day 2)  Lisinopril  40 Mg Tabs (Lisinopril) .... Take 1 Tablet By Mouth Once A Day 3)  Hydrochlorothiazide 25 Mg Tabs (Hydrochlorothiazide) .... Take 1 Tablet By Mouth Once A Day 4)  Aspirin 81 Mg Chew (Aspirin) .... Take 1 Tablet By Mouth Once A Day 5)  Pravastatin Sodium 20 Mg  Tabs (Pravastatin Sodium) .... Take 1 Tablet By Mouth At Bedtime 6)  Bd Insulin Syringe Half-Unit 31g X 5/16" 0.3 Ml  Misc (Insulin Syringe-Needle U-100) .... To Inject Insulin Once A Day. 7)  Cetirizine Hcl 10 Mg Tabs (Cetirizine Hcl) .... Take 1 Tablet By Mouth Once A Day For Allergies 8)  Truetrack Test  Strp (Glucose Blood) .... Use To Check Blood Sugar Once  Daily With 2 Weekly Profiles Before Meal Snd 2 Hours After Meals 9)  Insulin Syringe 31g X 5/16" 0.5 Ml Misc (Insulin Syringe-Needle U-100) .... To Inject Insulin Once Daily 10)  Lancets  Misc (Lancets) .... Use To Check Blood Sugar Daily With 2 Weekly Profiles 11)  Nortriptyline Hcl 50 Mg Caps (Nortriptyline Hcl) .... Take 2 Tabs By Mouth At Bedtime Starting 02/06/2009. 12)  Novolin 70/30 70-30 % Susp (Insulin Isophane & Regular) .... Take 60 Units Subcutaneously 30 Minutes Before Breakfast and 35 Units Subcutaneously 30 Minutes Before Dinner  Current Medications (verified): 1)  Synthroid 175 Mcg Tabs (Levothyroxine Sodium) .... Take 1 Tablet By Mouth Once A Day 2)  Lisinopril 40 Mg Tabs (Lisinopril) .... Take 1 Tablet By Mouth Once A Day 3)  Hydrochlorothiazide 25 Mg Tabs (Hydrochlorothiazide) .... Take 1 Tablet By Mouth Once A Day 4)  Aspirin 81 Mg Chew (Aspirin) .... Take 1 Tablet By Mouth Once A Day 5)  Pravastatin Sodium 20 Mg  Tabs (Pravastatin Sodium) .... Take 1 Tablet By Mouth At Bedtime 6)  Bd Insulin Syringe Half-Unit 31g X 5/16" 0.3 Ml  Misc (Insulin Syringe-Needle U-100) .... To Inject Insulin Once A Day. 7)  Cetirizine Hcl 10 Mg Tabs (Cetirizine Hcl) .... Take 1 Tablet By Mouth Once A Day For Allergies 8)  Truetrack Test  Strp (Glucose Blood) .... Use To Check Blood Sugar Once Daily With 2 Weekly Profiles Before Meal Snd 2 Hours After Meals 9)  Insulin Syringe 31g X 5/16" 0.5 Ml Misc (Insulin Syringe-Needle U-100) .... To Inject Insulin Once Daily 10)  Lancets  Misc (Lancets) .... Use To Check Blood Sugar Daily With 2 Weekly Profiles 11)  Nortriptyline Hcl 50 Mg Caps (Nortriptyline Hcl) .... Take 2 Tabs By Mouth At Bedtime Starting 02/06/2009. 12)  Novolin 70/30 70-30 % Susp (Insulin Isophane & Regular) .... Take 60 Units Subcutaneously 30 Minutes Before Breakfast and 35 Units Subcutaneously 30 Minutes Before Dinner  Allergies: No Known Drug Allergies  Past History:  Past  Medical History: Last updated: 01/07/2009 DIABETES MELLITUS, type 2 HYPERTENSION HYPOTHYROIDISM LEFT EYE CATARACT (Dr. 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 DEPRESSION, mild    - better on nortriptyline OBESITY HYPERPLASTIC POLYP, in 2001    - f/u colonoscopy 11/23/2006 was normal (Dr. Leone Payor)    - falls into normal risk screening (next colonoscopy due in 2018) PAP SMEARS    -  12/14/2005: normal, evidence of Candida    - 05/16/2007: normal, benign reparative changes  Family History: Last updated: 03/05/2008 Both parents and brother passed away.  Social History: Last updated: 01/07/2009 Married (2nd marriage). Husband awaiting  back surgery. BE AWARE THAT HUSBAND BROKE NARCOTIC CONTRACT WITH THE [employer] SEVERAL TIMES ! 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 She used to work at an [employer]. Never Smoked. Alcohol use-no Son (9 y/o unemployed) and grandson live with her. The grandson's mother basically dropped the child off when he was 3 y/o. They rarely hear from her. They moved to an apartment from their house in 12/2008.  Risk Factors: Alcohol Use: 0 (01/04/2010) Exercise: yes - somtimes (01/04/2010)  Risk Factors: Smoking Status: never (01/04/2010) Passive Smoke Exposure: no (01/04/2010)  Family History: Reviewed history from 03/05/2008 and no changes required. Both parents and brother passed away.  Social History: Reviewed history from 01/07/2009 and no changes required. Married (2nd marriage). Husband awaiting  back surgery. BE AWARE THAT HUSBAND BROKE  NARCOTIC CONTRACT WITH THE [employer] SEVERAL TIMES ! 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 She used to work at an [employer]. Never Smoked. Alcohol use-no Son (80 y/o unemployed) and grandson live with her. The grandson's mother basically dropped the child off when he was 7 y/o. They rarely hear from her. They moved to an apartment from their house in 12/2008. Does Patient Exercise:  yes - somtimes  Review of Systems       The patient complains of depression.  The patient denies fever, chest pain, dyspnea on exertion, peripheral edema, headaches, hemoptysis, abdominal pain, melena, hematochezia, hematuria, difficulty walking, and unusual weight change.    Physical Exam  General:  NAD Mouth:  MMM Neck:  supple.   Lungs:  normal respiratory effort, normal breath sounds, no crackles, and no wheezes.   Heart:  normal rate, regular rhythm, no murmur, and no JVD.   Abdomen:  soft, non-tender, normal bowel sounds, no distention, and no masses.   Msk:  normal ROM, no joint tenderness, no joint swelling, no joint warmth, and no redness over joints.   Pulses:  2+ pulses throughout Extremities:  No edema. Neurologic:  Nonfocal, alert & oriented X3, cranial nerves II-XII intact, strength normal in all extremities, and sensation intact to light touch.   Skin:  1 in. boil under left arm pit, nonfluctuant, ttp Psych:  depressed affect.     Impression & Recommendations:  Problem # 1:  HIDRADENITIS SUPPURATIVA (ICD-705.83) Patient instructed to decrease the use of antiperspirant. Will have her take doxycycline for 10 days. Will reasses in two weeks.   Problem # 2:  CHRONIC KIDNEY DISEASE STAGE III (MODERATE) (ICD-585.3) Creatinine at baseline. On follow up will make referral to Nephrology. Early referral to subspecialty as been associated with decreased morbidity and mortality.  Orders: T-Comprehensive  Metabolic Panel [phone])  Labs Reviewed: BUN: 44 (09/04/2009)   Cr: 1.37 (09/04/2009)    Hgb: 11.8 (04/22/2009)   Hct: 36.0 (04/22/2009)   Ca++: 9.2 (09/04/2009)    TP: 7.0 (04/22/2009)   Alb: 3.9 (04/22/2009)  Problem # 3:  DEPRESSIVE DISORDER (ICD-311) Patient not responding to nortriptyline. Will have her tapper off medication and in 2 weeks start her on Celexa.   Her updated medication list for this problem includes:    Nortriptyline Hcl 50 Mg  Caps (Nortriptyline hcl) .Marland Kitchen... Take 1 tab by mouth at bedtime for one week, then 1/2 tab by mouth at bedtime for one week, then stop taking medication  Problem # 4:  DIABETES MELLITUS, TYPE II (ICD-250.00) HgA1c elevated. Patient did not bring meter. Will have her start a 2week blood glucose log and bring with her in two weeks to further assess adequate increase of insulin. Diabetic Eye Exam done in 1/11.   Her updated medication list for this problem includes:    Lisinopril-hydrochlorothiazide 20-25 Mg Tabs (Lisinopril-hydrochlorothiazide) .Marland Kitchen... Take 1 tablet by mouth once a day    Aspirin 81 Mg Chew (Aspirin) .Marland Kitchen... Take 1 tablet by mouth once a day    Novolin 70/30 70-30 % Susp (Insulin isophane & regular) .Marland Kitchen... Take 60 units subcutaneously 30 minutes before breakfast and 35 units subcutaneously 30 minutes before dinner  Orders: T-Hgb A1C (in-house) ([address]UE) T- Capillary Blood Glucose ([address])  Labs Reviewed: Creat: 1.37 (09/04/2009)     Last Eye Exam: No diabetic retinopathy.   Visual acuity OD (best corrected):20/20 Visual acuity OS (best corrected):20/20  (05/15/2008) Reviewed HgBA1c results: 12.4 (01/04/2010)  9.8 (09/04/2009)  Problem # 5:  HYPERTENSION (ICD-401.9) Blood pressure soft. Decreased dose of lisinopril and combined medication with HCTZ to decrease cost. Reasses on follow up.  The following medications were removed from the medication list:    Hydrochlorothiazide 25 Mg Tabs (Hydrochlorothiazide) .Marland Kitchen... Take 1  tablet by mouth once a day Her updated medication list for this problem includes:    Lisinopril-hydrochlorothiazide 20-25 Mg Tabs (Lisinopril-hydrochlorothiazide) .Marland Kitchen... Take 1 tablet by mouth once a day  Orders: T-CBC w/Diff ([id number])  BP today: 108/75 Prior BP: 116/76 (09/04/2009)  Labs Reviewed: K+: 4.6 (09/04/2009) Creat: : 1.37 (09/04/2009)   Chol: 177 (04/22/2009)   HDL: 50 (04/22/2009)   LDL: 92 (04/22/2009)   TG: 173 (04/22/2009)  Problem # 6:  HYPERLIPIDEMIA (ICD-272.4) Recheck FLP on follow up.  Her updated medication list for this problem includes:    Pravastatin Sodium 20 Mg Tabs (Pravastatin sodium) .Marland Kitchen... Take 1 tablet by mouth at bedtime  Labs Reviewed: SGOT: 37 (04/22/2009)   SGPT: 34 (04/22/2009)   HDL:50 (04/22/2009), 47 (12/15/2008)  LDL:92 (04/22/2009), 105 (56/21/3086)  Chol:177 (04/22/2009), 214 (12/15/2008)  Trig:173 (04/22/2009), 310 (12/15/2008)  Problem # 7:  HYPOTHYROIDISM (ICD-244.9) TSH wnl. Recheck TSH in 3-6 months.  Her updated medication list for this problem includes:    Synthroid 175 Mcg Tabs (Levothyroxine sodium) .Marland Kitchen... Take 1 tablet by mouth once a day  Orders: T-TSH ([id number])  Complete Medication List: 1)  Synthroid 175 Mcg Tabs (Levothyroxine sodium) .... Take 1 tablet by mouth once a day 2)  Lisinopril-hydrochlorothiazide 20-25 Mg Tabs (Lisinopril-hydrochlorothiazide) .... Take 1 tablet by mouth once a day 3)  Aspirin 81 Mg Chew (Aspirin) .... Take 1 tablet by mouth once a day 4)  Pravastatin Sodium 20 Mg Tabs (Pravastatin sodium) .... Take 1 tablet by mouth at bedtime 5)  Bd Insulin Syringe Half-unit 31g X 5/16" 0.3 Ml Misc (Insulin syringe-needle u-100) .... To inject insulin once a day. 6)  Cetirizine Hcl 10 Mg Tabs (Cetirizine hcl) .... Take 1 tablet by mouth once a day for allergies 7)  Truetrack Test Strp (Glucose blood) .... Use to check blood sugar once daily with 2 weekly profiles before meal snd 2 hours after meals 8)   Insulin Syringe 31g X 5/16" 0.5 Ml Misc (Insulin syringe-needle u-100) .... To inject insulin once daily 9)  Lancets Misc (Lancets) .... Use to  check blood sugar daily with 2 weekly profiles 10)  Nortriptyline Hcl 50 Mg Caps (Nortriptyline hcl) .... Take 1 tab by mouth at bedtime for one week, then 1/2 tab by mouth at bedtime for one week, then stop taking medication 11)  Novolin 70/30 70-30 % Susp (Insulin isophane & regular) .... Take 60 units subcutaneously 30 minutes before breakfast and 35 units subcutaneously 30 minutes before dinner 12)  Neurontin 300 Mg Caps (Gabapentin) .... Take 1 tablet by mouth once a day day 1, then take 1 tablet by mouth two times a day day 2, then take 1 tablet by mouth three times a day 13)  Doxycycline Hyclate 100 Mg Caps (Doxycycline hyclate) .... Take 1 tablet by mouth once a day x 10 days  Patient Instructions: 1)  Please schedule a follow-up appointment in 2 weeks. 2)  Bring 2 week. blood glucose log for next appointment. 3)  Decreased nortriptyline to 50 mg a day for one week, then 25mg  a day for next week, then stop. 4)  Start taking neurontin as prescribed. 5)  Take Doxycycline as directed. 6)  You will be called with any abnormalities in the tests scheduled or performed today.  If you don't hear from [place] within a week from when the test was performed, you can assume that your test was normal.  Prescriptions: LISINOPRIL-HYDROCHLOROTHIAZIDE 20-25 MG TABS (LISINOPRIL-HYDROCHLOROTHIAZIDE) Take 1 tablet by mouth once a day  #30 x 3   Entered and Authorized by:   Laren Everts MD   Signed by:   Laren Everts MD on 01/04/2010   Method used:   Electronically to        [facility]       [address]       [place], [place]  [address]       Ph: [phone]       Fax: [phone]   RxID:   [id number] DOXYCYCLINE HYCLATE 100 MG CAPS (DOXYCYCLINE HYCLATE) Take 1 tablet by mouth once a day X 10  days  #10 x 0   Entered and Authorized by:   Laren Everts MD   Signed by:   Laren Everts MD on 01/04/2010   Method used:   Electronically to        [facility] ([facility])       [address]       [place], [place]  [address]       Ph: [phone]       Fax: [phone]   RxID:   [id number] NEURONTIN 300 MG CAPS (GABAPENTIN) Take 1 tablet by mouth once a day Day 1, Then Take 1 tablet by mouth two times a day Day 2, then Take 1 tablet by mouth three times a day  #90 x 3   Entered and Authorized by:   Laren Everts MD   Signed by:   Laren Everts MD on 01/04/2010   Method used:   Electronically to        [facility] ([facility])       [address]       [place], [place]  [address]       Ph: [phone]       Fax: [phone]   RxID:   [id number]   Prevention & Chronic Care Immunizations   Influenza vaccine: Fluvax 3+  (05/13/2009)  Tetanus booster: Not documented    Pneumococcal vaccine: Not documented  Colorectal Screening   Hemoccult: Not documented   Hemoccult action/deferral: Not indicated  (04/27/2009)    Colonoscopy: Normal  (01/27/2000)   Colonoscopy action/deferral: Deferred  (01/04/2010)   Colonoscopy due: 01/26/2010  Other Screening   Pap smear: Not documented    Mammogram: ASSESSMENT: Negative - BI-RADS 1^MM DIGITAL SCREENING  (09/08/2009)   Mammogram action/deferral: Deferred  (01/04/2010)   Mammogram due: 09/08/2010   Smoking status: never  (01/04/2010)  Diabetes Mellitus   HgbA1C: 12.4  (01/04/2010)    Eye exam: No diabetic retinopathy.   Visual acuity OD (best corrected):20/20 Visual acuity OS (best corrected):20/20   (05/15/2008)    Foot exam: yes  (09/04/2009)   Foot exam action/deferral: Do today   High risk foot: Not documented   Foot care education: Not documented    Urine microalbumin/creatinine ratio: 52.1   (04/22/2009)   Urine microalbumin action/deferral: Ordered    Diabetes flowsheet reviewed?: Yes   Progress toward A1C goal: Deteriorated  Lipids   Total Cholesterol: 177  (04/22/2009)   LDL: 92  (04/22/2009)   LDL Direct: Not documented   HDL: 50  (04/22/2009)   Triglycerides: 173  (04/22/2009)    SGOT (AST): 37  (04/22/2009)   SGPT (ALT): 34  (04/22/2009) CMP ordered    Alkaline phosphatase: 82  (04/22/2009)   Total bilirubin: 0.4  (04/22/2009)    Lipid flowsheet reviewed?: Yes   Progress toward LDL goal: At goal  Hypertension   Last Blood Pressure: 108 / 75  (01/04/2010)   Serum creatinine: 1.37  (09/04/2009)   Serum potassium 4.6  (09/04/2009) CMP ordered     Hypertension flowsheet reviewed?: Yes   Progress toward BP goal: At goal  Self-Management Support :   Personal Goals (by the next clinic visit) :     Personal A1C goal: 7  (04/27/2009)     Personal blood pressure goal: 140/90  (04/27/2009)     Personal LDL goal: 70  (04/27/2009)    Patient will work on the following items until the next clinic visit to reach self-care goals:     Medications and monitoring: take my medicines every day, check my blood sugar, bring all of my medications to every visit  (01/04/2010)     Eating: eat more vegetables, use fresh or frozen vegetables, eat foods that are low in salt, eat baked foods instead of fried foods, eat fruit for snacks and desserts  (01/04/2010)     Activity: take a 30 minute walk every day  (06/25/2009)    Diabetes self-management support: Education handout, Resources for patients handout, Written self-care plan  (01/04/2010)   Diabetes care plan printed   Diabetes education handout printed   Last diabetes self-management training by diabetes educator: 01/07/2009    Hypertension self-management support: Education handout, Resources for patients handout, Written self-care plan  (01/04/2010)   Hypertension self-care plan printed.   Hypertension education handout  printed    Lipid self-management support: Education handout, Resources for patients handout, Written self-care plan  (01/04/2010)   Lipid self-care plan printed.   Lipid education handout printed      Resource handout printed.  Process Orders Check Orders Results:     [facility]: ABN not required for this insurance Tests Sent for requisitioning (Jan 12, 2010 12:21 PM):     01/04/2010: [facility] -- T-Comprehensive Metabolic Panel [[id number]] (signed)     01/04/2010: [facility] -- [facility] w/Diff [  [id number]] (signed)     01/04/2010: [facility] -- T-TSH [phone] (signed)    Laboratory Results   Blood Tests   Date/Time Received: Jan 04, 2010 2:43 PM  Date/Time Reported: Burke Keels  Jan 04, 2010 2:44 PM   HGBA1C: 12.4%   (Normal Range: Non-Diabetic - 3-6%   Control Diabetic - 6-8%) CBG Random:: 286mg /dL

## 2010-09-14 NOTE — Progress Notes (Signed)
Summary: med refill/gp  Phone Note Refill Request Message from:  Fax from Pharmacy on Dec 14, 2009 9:14 AM  Refills Requested: Medication #1:  HYDROCHLOROTHIAZIDE 25 MG TABS Take 1 tablet by mouth once a day   Last Refilled: 10/28/2009  Method Requested: Electronic Initial call taken by: Chinita Pester RN,  Dec 14, 2009 9:14 AM    Prescriptions: HYDROCHLOROTHIAZIDE 25 MG TABS (HYDROCHLOROTHIAZIDE) Take 1 tablet by mouth once a day  #30 x 11   Entered and Authorized by:   Laren Everts MD   Signed by:   Laren Everts MD on 12/14/2009   Method used:   Electronically to        [facility] ([facility])       [address]       [place], [place]  [address]       Ph: [phone]       Fax: [phone]   RxID:   [id number]

## 2010-09-14 NOTE — Progress Notes (Signed)
Summary: refill/gg  Phone Note Refill Request  on February 22, 2010 12:36 PM  Refills Requested: Medication #1:  SYNTHROID 175 MCG TABS Take 1 tablet by mouth once a day   Last Refilled: 01/15/2010  Method Requested: Electronic Initial call taken by: Merrie Roof RN,  February 22, 2010 12:37 PM    Prescriptions: SYNTHROID 175 MCG TABS (LEVOTHYROXINE SODIUM) Take 1 tablet by mouth once a day  #30 x 11   Entered and Authorized by:   Laren Everts MD   Signed by:   Laren Everts MD on 02/23/2010   Method used:   Electronically to        [facility] ([facility])       [address]       [place], [place]  [address]       Ph: [phone]       Fax: [phone]   RxID:   [id number]

## 2010-09-16 NOTE — Progress Notes (Signed)
Summary: refill/gg  Phone Note Refill Request  on July 26, 2010 3:26 PM  Refills Requested: Medication #1:  PRAVASTATIN SODIUM 20 MG  TABS Take 1 tablet by mouth at bedtime   Last Refilled: 05/02/2010  Method Requested: Electronic Initial call taken by: Merrie Roof RN,  July 26, 2010 3:26 PM    Prescriptions: PRAVASTATIN SODIUM 20 MG  TABS (PRAVASTATIN SODIUM) Take 1 tablet by mouth at bedtime  #90 x 4   Entered and Authorized by:   Laren Everts MD   Signed by:   Laren Everts MD on 07/28/2010   Method used:   Electronically to        [facility] ([facility])       [address]       [place], [place]  [address]       Ph: [phone]       Fax: [phone]   RxID:   [id number]

## 2010-10-13 ENCOUNTER — Other Ambulatory Visit (HOSPITAL_COMMUNITY): Payer: Self-pay | Admitting: Geriatric Medicine

## 2010-10-13 DIAGNOSIS — Z1231 Encounter for screening mammogram for malignant neoplasm of breast: Secondary | ICD-10-CM

## 2010-10-21 ENCOUNTER — Ambulatory Visit (HOSPITAL_COMMUNITY): Payer: Self-pay | Attending: Geriatric Medicine

## 2010-10-31 LAB — GLUCOSE, CAPILLARY: Glucose-Capillary: 234 mg/dL — ABNORMAL HIGH (ref 70–99)

## 2010-11-01 LAB — GLUCOSE, CAPILLARY: Glucose-Capillary: 286 mg/dL — ABNORMAL HIGH (ref 70–99)

## 2010-11-18 LAB — GLUCOSE, CAPILLARY
Glucose-Capillary: >600 mg/dL (ref 70–99)
Glucose-Capillary: >600 mg/dL (ref 70–99)
Glucose-Capillary: 277 mg/dL — ABNORMAL HIGH (ref 70–99)

## 2010-11-19 LAB — DIFFERENTIAL
Basophils Relative: 1 % (ref 0–1)
Lymphocytes Relative: 23 % (ref 12–46)
Lymphs Abs: 2.1 10*3/uL (ref 0.7–4.0)
Monocytes Absolute: 0.5 10*3/uL (ref 0.1–1.0)
Monocytes Relative: 6 % (ref 3–12)
Neutro Abs: 6.1 10*3/uL (ref 1.7–7.7)
Neutrophils Relative %: 67 % (ref 43–77)

## 2010-11-19 LAB — CBC
Hemoglobin: 12.1 g/dL (ref 12.0–15.0)
MCHC: 34.5 g/dL (ref 30.0–36.0)
RBC: 3.87 MIL/uL (ref 3.87–5.11)
WBC: 9.1 10*3/uL (ref 4.0–10.5)

## 2010-11-19 LAB — BASIC METABOLIC PANEL
CO2: 22 mEq/L (ref 19–32)
Calcium: 9.5 mg/dL (ref 8.4–10.5)
Creatinine, Ser: 1.5 mg/dL — ABNORMAL HIGH (ref 0.4–1.2)
GFR calc Af Amer: 43 mL/min — ABNORMAL LOW (ref >60)
GFR calc non Af Amer: 36 mL/min — ABNORMAL LOW (ref >60)
Sodium: 138 mEq/L (ref 135–145)

## 2010-11-19 LAB — HEPATIC FUNCTION PANEL
ALT: 62 U/L — ABNORMAL HIGH (ref 0–35)
AST: 68 U/L — ABNORMAL HIGH (ref 0–37)
Alkaline Phosphatase: 86 U/L (ref 39–117)
Indirect Bilirubin: 0.7 mg/dL (ref 0.3–0.9)
Total Protein: 7.5 g/dL (ref 6.0–8.3)

## 2010-11-19 LAB — URINALYSIS, ROUTINE W REFLEX MICROSCOPIC
Glucose, UA: NEGATIVE mg/dL
Specific Gravity, Urine: 1.019 (ref 1.005–1.030)
Urobilinogen, UA: 0.2 mg/dL (ref 0.0–1.0)
pH: 5 (ref 5.0–8.0)

## 2010-11-19 LAB — URINE MICROSCOPIC-ADD ON

## 2010-11-19 LAB — URINE CULTURE: Colony Count: 70000

## 2010-11-19 LAB — GLUCOSE, CAPILLARY: Glucose-Capillary: 276 mg/dL — ABNORMAL HIGH (ref 70–99)

## 2010-11-23 LAB — GLUCOSE, CAPILLARY: Glucose-Capillary: 390 mg/dL — ABNORMAL HIGH (ref 70–99)

## 2010-12-28 NOTE — Op Note (Signed)
NAMEEMANI, TAUSSIG             ACCOUNT NO.:  [id number]   MEDICAL RECORD NO.:  [id number]          PATIENT TYPE:  AMB   LOCATION:  DAY                          FACILITY:  [facility]   PHYSICIAN:  Mark C. Vernie Ammons, M.D.  DATE OF BIRTH:  [birth year]   DATE OF PROCEDURE:  12/05/2007  DATE OF DISCHARGE:                               OPERATIVE REPORT   PREOPERATIVE DIAGNOSES:  Left ureteral calculus.   POSTOPERATIVE DIAGNOSES:  History of left ureteral calculus.   PROCEDURE:  Cystoscopy, left retrograde pyelogram with interpretation,  and left diagnostic ureteroscopy.   SURGEON:  Mark C. Vernie Ammons, M.D.   ANESTHESIA:  General.   DRAINS:  None.   BLOOD LOSS:  None.   SPECIMENS:  None.   COMPLICATIONS:  None.   INDICATIONS:  Patient is a 60 year old white female with a distal left  ureteral calculus that did show progression, although it was slow  progression, and continued to remain and cause pain intermittently.  I  gave her time to pass the stone, but she said she has not seen the stone  pass and is, therefore, brought to the operating room today for  ureteroscopic extraction of the stone.  I could not see the stone on her  KUB today, although we did discuss evaluating completely under  anesthesia to make sure there is no stone present.  The risks,  complications, alternatives and limitations were discussed.  She  understands and elected to proceed.   DESCRIPTION OF OPERATION:  After informed consent, patient was brought  to the major OR and placed on the table, administered general  anesthesia, then moved to the dorsal lithotomy position.  Her genitalia  were sterilely prepped and draped and an official time-out was then  performed.   Initially, a #22 [place] cystoscope with 12 degree lens was inserted into  the bladder.  The bladder was fully inspected and noted to be free of  any tumor, stones or inflammatory lesions.  The left and right ureteral  orifices appeared  normal.   A 0.038 inch floppy-tipped guide wire was then passed up the left ureter  under direct fluoroscopic control and I removed the cystoscope, leaving  the guide wire in place.  The inner cannula of the ureteral access  sheath was then used to gently dilate the distal left ureteral orifice  over the guide wire.  I then left the guide wire in place and proceeded  to perform ureteroscopy.   The #6 French rigid ureteroscope was then passed in the bladder, under  direct visualization and then the left ureteral orifice without  difficulty.  It was gently passed up the ureter, but no stone could be  identified.  I, therefore, proceeded with the retrograde pyelogram to  evaluate the more proximal ureter.   Full-strength contrast was injected through the ureteroscope under  direct fluoroscopy and I noted the ureter to be entirely normal with no  filling defects or other abnormality.  I then removed the ureteroscope  under direct visualization and noted no stone within the distal ureter  as the scope was removed.  The bladder was  then drained and the patient  was awakened and taken to the recovery room in stable, satisfactory  condition.  She tolerated the procedure well.  There were no  intraoperative complications.      Mark C. Vernie Ammons, M.D.  Electronically Signed     MCO/MEDQ  D:  12/05/2007  T:  12/05/2007  Job:  [id number]

## 2010-12-31 ENCOUNTER — Encounter: Payer: Self-pay | Admitting: Internal Medicine

## 2010-12-31 NOTE — Procedures (Signed)
[facility]  Patient:    Tami Elliott, Tami Elliott                    MRN: [id number] Proc. Date: 01/27/00 Adm. Date:  [id number] Disc. Date: [id number] Attending:  Charna Elizabeth CC:         Lilia Pro, M.D.                           Procedure Report  DATE OF BIRTH:  12-27-1950  REFERRING PHYSICIAN:  Lilia Pro, M.D.  PROCEDURE PERFORMED:  Colonoscopy with snare polypectomy x 1.  ENDOSCOPIST:  Anselmo Rod, M.D.  INSTRUMENT USED:  Olympus video colonoscope.  INDICATIONS FOR PROCEDURE:  A 60 year old white female with guaiac positive stool, rule out colonic polyps, masses, hemorrhoids, etc.  PREPROCEDURE PREPARATION:  Informed consent was procured from the patient. The patient was fasted for eight hours prior to the procedure, and prepped with a bottle of magnesium citrate and a gallon of NuLytely the night prior to the procedure.  PREPROCEDURE PHYSICAL:  VITAL SIGNS:  Stable.  NECK:  Supple.  CHEST:  Clear to auscultation, S1 and S2 regular.  ABDOMEN:  Soft with normal abdominal bowel sounds.  DESCRIPTION OF PROCEDURE:  The patient was placed in the left lateral decubitus position, and sedated with 40 mg of Demerol and 3.5 mg of Versed intravenously.  Once the patient was adequately sedated, maintained on low flow oxygen and continuous cardiac monitoring, the Olympus video colonoscope was advanced from the rectum to the cecum without difficulty.  Except for a small sessile polyp that was snared from 30 cm and a small internal hemorrhoid, no other abnormalities were seen.  There was a large amount of residual stool in the right colon, therefore visualization was marginal in this area.  IMPRESSION: 1. One small sessile polyp snared from 30 cm. 2. Small internal hemorrhoids. 3. Residual stool in the right colon, visualization is marginal. 4. No large masses or polyps seen.  RECOMMENDATIONS: 1. Await pathology results. 2.  Avoid all nonsteroidals for the next two weeks. 3. Outpatient follow up in the next two weeks. DD:  01/27/00 TD:  01/31/00 Job: [id number]

## 2011-01-02 ENCOUNTER — Encounter: Payer: Self-pay | Admitting: Internal Medicine

## 2011-05-09 LAB — URINALYSIS, ROUTINE W REFLEX MICROSCOPIC
Bilirubin Urine: NEGATIVE
Glucose, UA: NEGATIVE
Ketones, ur: NEGATIVE
Leukocytes, UA: NEGATIVE
Nitrite: NEGATIVE
Protein, ur: NEGATIVE
Specific Gravity, Urine: 1.019
Urobilinogen, UA: 0.2
pH: 5

## 2011-05-09 LAB — URINE CULTURE: Colony Count: 5000

## 2011-05-09 LAB — BASIC METABOLIC PANEL
CO2: 26
Chloride: 103
GFR calc Af Amer: 53 — ABNORMAL LOW
Glucose, Bld: 226 — ABNORMAL HIGH
Potassium: 4.3
Sodium: 140

## 2011-05-09 LAB — BASIC METABOLIC PANEL WITH GFR
BUN: 31 — ABNORMAL HIGH
Calcium: 9.2
Creatinine, Ser: 1.26 — ABNORMAL HIGH
GFR calc non Af Amer: 44 — ABNORMAL LOW

## 2011-05-09 LAB — URINE MICROSCOPIC-ADD ON

## 2011-05-09 LAB — CBC
HCT: 35.3 — ABNORMAL LOW
Hemoglobin: 12.1
MCHC: 34.3
MCV: 86.9
Platelets: 278
RBC: 4.06
RDW: 14.7
WBC: 7.8

## 2011-05-09 LAB — DIFFERENTIAL
Basophils Absolute: 0
Basophils Relative: 1
Eosinophils Absolute: 0.4
Eosinophils Relative: 5
Lymphocytes Relative: 28
Lymphs Abs: 2.2
Monocytes Absolute: 0.5
Monocytes Relative: 7
Neutro Abs: 4.6
Neutrophils Relative %: 60

## 2011-05-10 LAB — BASIC METABOLIC PANEL WITH GFR
BUN: 27 — ABNORMAL HIGH
CO2: 27
Calcium: 9.5
Chloride: 102
Creatinine, Ser: 1.16
GFR calc non Af Amer: 49 — ABNORMAL LOW
Glucose, Bld: 224 — ABNORMAL HIGH
Potassium: 4.5
Sodium: 138

## 2011-05-10 LAB — URINALYSIS, ROUTINE W REFLEX MICROSCOPIC
Bilirubin Urine: NEGATIVE
Glucose, UA: NEGATIVE
Hgb urine dipstick: NEGATIVE
Ketones, ur: NEGATIVE
Nitrite: NEGATIVE
Protein, ur: NEGATIVE
Specific Gravity, Urine: 1.016
Urobilinogen, UA: 0.2
pH: 5

## 2011-05-10 LAB — HEMOGLOBIN AND HEMATOCRIT, BLOOD
HCT: 34.8 — ABNORMAL LOW
Hemoglobin: 11.8 — ABNORMAL LOW

## 2011-05-17 LAB — GLUCOSE, CAPILLARY: Glucose-Capillary: 205 — ABNORMAL HIGH

## 2011-05-31 ENCOUNTER — Ambulatory Visit: Payer: Self-pay | Admitting: Gastroenterology

## 2011-06-03 ENCOUNTER — Inpatient Hospital Stay (INDEPENDENT_AMBULATORY_CARE_PROVIDER_SITE_OTHER)
Admission: RE | Admit: 2011-06-03 | Discharge: 2011-06-03 | Disposition: A | Discharge status: Home / Self Care | Payer: Medicare Other | Source: Ambulatory Visit | Attending: Emergency Medicine | Admitting: Emergency Medicine

## 2011-06-03 DIAGNOSIS — L0291 Cutaneous abscess, unspecified: Secondary | ICD-10-CM

## 2011-06-21 ENCOUNTER — Other Ambulatory Visit: Payer: Self-pay | Admitting: Geriatric Medicine

## 2011-06-21 DIAGNOSIS — Z1231 Encounter for screening mammogram for malignant neoplasm of breast: Secondary | ICD-10-CM

## 2011-07-15 ENCOUNTER — Ambulatory Visit: Payer: Medicare Other

## 2011-08-11 ENCOUNTER — Ambulatory Visit (HOSPITAL_BASED_OUTPATIENT_CLINIC_OR_DEPARTMENT_OTHER): Payer: Medicare Other

## 2011-08-16 ENCOUNTER — Emergency Department (HOSPITAL_COMMUNITY)
Admission: EM | Admit: 2011-08-16 | Discharge: 2011-08-16 | Disposition: A | Discharge status: Home / Self Care | Payer: Medicare Other | Attending: Emergency Medicine | Admitting: Emergency Medicine

## 2011-08-16 ENCOUNTER — Emergency Department (HOSPITAL_COMMUNITY): Payer: Medicare Other

## 2011-08-16 ENCOUNTER — Encounter (HOSPITAL_COMMUNITY): Payer: Self-pay | Admitting: *Deleted

## 2011-08-16 DIAGNOSIS — E039 Hypothyroidism, unspecified: Secondary | ICD-10-CM | POA: Insufficient documentation

## 2011-08-16 DIAGNOSIS — Z7982 Long term (current) use of aspirin: Secondary | ICD-10-CM | POA: Insufficient documentation

## 2011-08-16 DIAGNOSIS — M79609 Pain in unspecified limb: Secondary | ICD-10-CM | POA: Insufficient documentation

## 2011-08-16 DIAGNOSIS — I129 Hypertensive chronic kidney disease with stage 1 through stage 4 chronic kidney disease, or unspecified chronic kidney disease: Secondary | ICD-10-CM | POA: Insufficient documentation

## 2011-08-16 DIAGNOSIS — IMO0002 Reserved for concepts with insufficient information to code with codable children: Secondary | ICD-10-CM | POA: Insufficient documentation

## 2011-08-16 DIAGNOSIS — I872 Venous insufficiency (chronic) (peripheral): Secondary | ICD-10-CM

## 2011-08-16 DIAGNOSIS — Z79899 Other long term (current) drug therapy: Secondary | ICD-10-CM | POA: Insufficient documentation

## 2011-08-16 DIAGNOSIS — E119 Type 2 diabetes mellitus without complications: Secondary | ICD-10-CM | POA: Insufficient documentation

## 2011-08-16 DIAGNOSIS — N189 Chronic kidney disease, unspecified: Secondary | ICD-10-CM | POA: Insufficient documentation

## 2011-08-16 DIAGNOSIS — F3289 Other specified depressive episodes: Secondary | ICD-10-CM | POA: Insufficient documentation

## 2011-08-16 DIAGNOSIS — F329 Major depressive disorder, single episode, unspecified: Secondary | ICD-10-CM | POA: Insufficient documentation

## 2011-08-16 DIAGNOSIS — S90129A Contusion of unspecified lesser toe(s) without damage to nail, initial encounter: Secondary | ICD-10-CM | POA: Insufficient documentation

## 2011-08-16 DIAGNOSIS — R609 Edema, unspecified: Secondary | ICD-10-CM | POA: Insufficient documentation

## 2011-08-16 DIAGNOSIS — R21 Rash and other nonspecific skin eruption: Secondary | ICD-10-CM | POA: Insufficient documentation

## 2011-08-16 DIAGNOSIS — S92912A Unspecified fracture of left toe(s), initial encounter for closed fracture: Secondary | ICD-10-CM

## 2011-08-16 DIAGNOSIS — S92919A Unspecified fracture of unspecified toe(s), initial encounter for closed fracture: Secondary | ICD-10-CM | POA: Insufficient documentation

## 2011-08-16 MED ORDER — HYDROCODONE-ACETAMINOPHEN 5-325 MG PO TABS: 2.0000 | ORAL_TABLET | ORAL | Status: AC | PRN

## 2011-08-16 NOTE — ED Notes (Signed)
MD at bedside. 

## 2011-08-16 NOTE — ED Notes (Addendum)
Rash and blisters on legs since Sept 17th, injuried left 2nd toe yesterday

## 2011-08-16 NOTE — ED Provider Notes (Signed)
History     CSN: [id number]  Arrival date & time 08/16/11  1024   First MD Initiated Contact with Patient 08/16/11 1047      Chief Complaint  Patient presents with  . Blister    bil legs  . Toe Injury    (Consider location/radiation/quality/duration/timing/severity/associated sxs/prior treatment) HPI This 61 year old white female has a history of diabetes and chronic stasis dermatitis with edema to her lower legs who accidentally stubbed her left second toe yesterday causing some bruising and pain and tenderness localized without radiation to the left second toe. Besides discoloration from bruising and slight if any swelling there is no deformity to the left second toe. She has no pain to the rest of her foot another injury. There is no skin breakdown to her foot with no skin ulcers or blistering to her feet. There is no weakness or numbness to her feet. She does have some chronic stasis dermatitis to her lower legs anteriorly bilaterally with some occasional weeping as well as dry flaking skin and occasional clear vesicles of which she has one clear vesicle 1 cm diameter her right anterior lower leg currently for a couple days.  She has no pain to her anterior lower legs and no new redness and no pus drainage or foul odor to her lower legs. She has no fever nausea vomiting chest pain shortness of breath abdominal pain or other concerns. Past Medical History  Diagnosis Date  . Diabetes mellitus   . Hypertension   . Hypothyroidism   . Cataract     Left eye( Dr. Dione Booze, patient uninsured so she can't  get  an intervention)  . Chronic kidney disease     nephrolithiasis, s/p removal 12/05/07  . Back pain     myofascial, excacerberated in 6-7/09, 9/09- required narcotics at those times  . Elbow fracture, right   . Obesity   . Depression     better on nortriptyline  . Hyperplastic colon polyp 2001    f/u colonoscopy 11/23/06 was normal( Dr. Leone Payor), falls into normal risk screening( next  colonoscopy due in 2018)  . Pap smear for cervical cancer screening     12/14/2005: normal, evidence of candida. 05/16/2007: normal, benign reperative changes.    History reviewed. No pertinent past surgical history.  No family history on file.  History  Substance Use Topics  . Smoking status: Never Smoker   . Smokeless tobacco: Not on file  . Alcohol Use: Not on file    OB History    Grav Para Term Preterm Abortions TAB SAB Ect Mult Living                  Review of Systems  Constitutional: Negative for fever.       10 Systems reviewed and are negative for acute change except as noted in the HPI.  HENT: Negative for congestion.   Eyes: Negative for discharge and redness.  Respiratory: Negative for cough and shortness of breath.   Cardiovascular: Negative for chest pain.  Gastrointestinal: Negative for vomiting and abdominal pain.  Musculoskeletal: Negative for back pain.  Skin: Positive for rash.  Neurological: Negative for syncope, numbness and headaches.  Psychiatric/Behavioral:       No behavior change.    Allergies  Review of patient's allergies indicates no known allergies.  Home Medications   Current Outpatient Rx  Name Route Sig Dispense Refill  . ASPIRIN 81 MG PO TABS Oral Take 81 mg by mouth daily.      Marland Kitchen  DULOXETINE HCL 30 MG PO CPEP Oral Take 30 mg by mouth daily.      . DULOXETINE HCL 60 MG PO CPEP Oral Take 60 mg by mouth every evening.      . INSULIN GLARGINE 100 UNIT/ML [place] SOLN Subcutaneous Inject 70 Units into the skin at bedtime.      Marland Kitchen LATANOPROST 0.005 % OP SOLN Both Eyes Place 1 drop into both eyes at bedtime.      Marland Kitchen LEVOTHYROXINE SODIUM 150 MCG PO TABS Oral Take 150 mcg by mouth daily.      . MELOXICAM 15 MG PO TABS Oral Take 15 mg by mouth daily.      Marland Kitchen METOPROLOL-HYDROCHLOROTHIAZIDE 50-25 MG PO TABS Oral Take 1 tablet by mouth daily.      Marland Kitchen HYDROCODONE-ACETAMINOPHEN 5-325 MG PO TABS Oral Take 2 tablets by mouth every 4 (four) hours as needed  for pain. 20 tablet 0    BP 130/83  Pulse 53  Temp(Src) 97.8 F (36.6 C) (Oral)  Resp 18  SpO2 100%  Physical Exam  Nursing note and vitals reviewed. Constitutional:       Awake, alert, nontoxic appearance.  HENT:  Head: Atraumatic.  Eyes: Right eye exhibits no discharge. Left eye exhibits no discharge.  Neck: Neck supple.  Cardiovascular: Normal rate and regular rhythm.   No murmur heard. Pulmonary/Chest: Effort normal and breath sounds normal. No respiratory distress. She has no wheezes. She has no rales. She exhibits no tenderness.  Abdominal: Soft. There is no tenderness. There is no rebound.  Musculoskeletal: She exhibits edema and tenderness.       Baseline ROM, no obvious new focal weakness.  Both legs have capillary refill less than 2 seconds in all toes she has dorsalis pedis pulses intact bilaterally she has normal light touch with good range of motion and good strength in both feet and both legs she is no tenderness to her thighs or calves, her anterior lower legs bilaterally have chronic stasis dermatitis skin changes with minimal erythema without induration or tenderness she does have some flaking dry skin as well as some mild edema with some oozing of clear serous drainage with tiny vesicles in a single 1 cm vesicle to the right anterior lower leg, both feet are without ulcers or blisters and her left second toe has some ecchymosis and mild tenderness with no rotational defect and no deformity the rest of her toes in her left foot in the rest of the left foot are totally nontender.  Neurological: She is alert.       Mental status and motor strength appears baseline for patient and situation.  Skin: No rash noted.  Psychiatric: She has a normal mood and affect.    ED Course  Procedures (including critical care time)  Labs Reviewed - No data to display No results found.   1. Toe fracture, left   2. Stasis dermatitis       MDM  I doubt any other EMC precluding  discharge at this time including, but not necessarily limited to the following:SBI.        Hurman Horn, MD 08/16/11 [phone]

## 2011-08-31 ENCOUNTER — Ambulatory Visit (HOSPITAL_BASED_OUTPATIENT_CLINIC_OR_DEPARTMENT_OTHER): Payer: Medicare Other | Attending: Internal Medicine | Admitting: Radiology

## 2011-08-31 ENCOUNTER — Ambulatory Visit: Payer: Medicare Other

## 2011-08-31 VITALS — Ht 64.0 in | Wt 278.0 lb

## 2011-08-31 DIAGNOSIS — I491 Atrial premature depolarization: Secondary | ICD-10-CM | POA: Insufficient documentation

## 2011-08-31 DIAGNOSIS — G4733 Obstructive sleep apnea (adult) (pediatric): Secondary | ICD-10-CM | POA: Insufficient documentation

## 2011-08-31 DIAGNOSIS — G4730 Sleep apnea, unspecified: Secondary | ICD-10-CM

## 2011-09-03 DIAGNOSIS — G4733 Obstructive sleep apnea (adult) (pediatric): Secondary | ICD-10-CM

## 2011-09-03 DIAGNOSIS — I491 Atrial premature depolarization: Secondary | ICD-10-CM

## 2011-09-03 NOTE — Procedures (Signed)
NAMECHRISTENA, Tami Elliott             ACCOUNT NO.:  [id number]  MEDICAL RECORD NO.:  [id number]          PATIENT TYPE:  OUT  LOCATION:  SLEEP CENTER                 FACILITY:  [facility]  PHYSICIAN:  Perl Kerney D. Maple Hudson, MD, FCCP, FACPDATE OF BIRTH:  01/19/1951  DATE OF STUDY:  08/31/2011                           NOCTURNAL POLYSOMNOGRAM  REFERRING PHYSICIAN:  Margaretta Chittum D. Candida Vetter, MD, FCCP, FACP  INDICATION FOR STUDY:  Hypersomnia with sleep apnea.  EPWORTH SLEEPINESS SCORE:  15/24.  BMI 47.7, weight 278 pounds, height 64 inches, neck 17 inches.  MEDICATIONS:  Charted and reviewed.  SLEEP ARCHITECTURE:  Total sleep time 291 minutes with sleep efficiency 75.8%.  Stage I was 21.1%, stage II 76.6%, stage III absent, REM 2.2% of total sleep time.  Sleep latency 40 minutes, REM latency 203 minutes, awake after sleep onset 54 minutes.  Arousal index 82.1.  Bedtime medication: Lantus.  RESPIRATORY DATA:  Apnea hypopnea index (AHI) 37.7 per hour.  A total of 183 events was scored including 18 obstructive apneas, 165 hypopneas. Events were not positional.  REM AHI 83.1 per hour.  This is a diagnostic NPSG protocol.  OXYGEN DATA:  Moderately loud snoring with oxygen desaturation to a nadir of 76% and a mean oxygen saturation through the study of 91% on room air.  Supplemental oxygen at 1 L/minute was started per protocol at 4:19 a.m. because of persistent desaturation.  Final oxygen saturations reflected.  Use of oxygen with sustained saturation at approximately 93%.  CARDIAC DATA:  Sinus rhythm with occasional PAC.  MOVEMENT-PARASOMNIA:  Periodic limb movement.  A total of 83 limb jerks were counted, of which 7 were associated with arousals or awakening for periodic limb movement with arousal index of 1.4 per hour.  No bathroom trips.  IMPRESSIONS-RECOMMENDATIONS: 1. Severe obstructive sleep apnea/hypopnea syndrome, AHI 37.7 per hour     with non positional events, moderately loud snoring and  oxygen     desaturation to a nadir of 76% on room air. 2. Supplemental oxygen was added at 1 L/minute at 4:19 a.m. to hold     oxygen saturation around 92-93%.  CPAP titration was not done on     the study night.     Isador Castille D. Maple Hudson, MD, [facility], FACP Diplomate, Biomedical engineer of Sleep Medicine Electronically Signed    CDY/MEDQ  D:  09/03/2011 10:53:31  T:  09/03/2011 12:06:13  Job:  [id number]

## 2011-09-23 ENCOUNTER — Encounter (HOSPITAL_COMMUNITY): Payer: Self-pay | Admitting: *Deleted

## 2011-09-23 ENCOUNTER — Emergency Department (HOSPITAL_COMMUNITY)
Admission: EM | Admit: 2011-09-23 | Discharge: 2011-09-23 | Disposition: A | Discharge status: Home / Self Care | Payer: Medicare Other | Attending: Emergency Medicine | Admitting: Emergency Medicine

## 2011-09-23 DIAGNOSIS — L03116 Cellulitis of left lower limb: Secondary | ICD-10-CM

## 2011-09-23 DIAGNOSIS — F3289 Other specified depressive episodes: Secondary | ICD-10-CM | POA: Insufficient documentation

## 2011-09-23 DIAGNOSIS — F329 Major depressive disorder, single episode, unspecified: Secondary | ICD-10-CM | POA: Insufficient documentation

## 2011-09-23 DIAGNOSIS — L02419 Cutaneous abscess of limb, unspecified: Secondary | ICD-10-CM | POA: Insufficient documentation

## 2011-09-23 DIAGNOSIS — E119 Type 2 diabetes mellitus without complications: Secondary | ICD-10-CM | POA: Insufficient documentation

## 2011-09-23 DIAGNOSIS — E039 Hypothyroidism, unspecified: Secondary | ICD-10-CM | POA: Insufficient documentation

## 2011-09-23 DIAGNOSIS — Z79899 Other long term (current) drug therapy: Secondary | ICD-10-CM | POA: Insufficient documentation

## 2011-09-23 DIAGNOSIS — R609 Edema, unspecified: Secondary | ICD-10-CM | POA: Insufficient documentation

## 2011-09-23 DIAGNOSIS — Z7982 Long term (current) use of aspirin: Secondary | ICD-10-CM | POA: Insufficient documentation

## 2011-09-23 DIAGNOSIS — L97809 Non-pressure chronic ulcer of other part of unspecified lower leg with unspecified severity: Secondary | ICD-10-CM | POA: Insufficient documentation

## 2011-09-23 DIAGNOSIS — L03115 Cellulitis of right lower limb: Secondary | ICD-10-CM

## 2011-09-23 DIAGNOSIS — L03119 Cellulitis of unspecified part of limb: Secondary | ICD-10-CM | POA: Insufficient documentation

## 2011-09-23 DIAGNOSIS — I872 Venous insufficiency (chronic) (peripheral): Secondary | ICD-10-CM | POA: Insufficient documentation

## 2011-09-23 DIAGNOSIS — M79609 Pain in unspecified limb: Secondary | ICD-10-CM | POA: Insufficient documentation

## 2011-09-23 DIAGNOSIS — I10 Essential (primary) hypertension: Secondary | ICD-10-CM | POA: Insufficient documentation

## 2011-09-23 DIAGNOSIS — Z794 Long term (current) use of insulin: Secondary | ICD-10-CM | POA: Insufficient documentation

## 2011-09-23 LAB — BASIC METABOLIC PANEL
BUN: 32 mg/dL — ABNORMAL HIGH (ref 6–23)
CO2: 27 mEq/L (ref 19–32)
Calcium: 9.7 mg/dL (ref 8.4–10.5)
Creatinine, Ser: 1.85 mg/dL — ABNORMAL HIGH (ref 0.50–1.10)
Glucose, Bld: 213 mg/dL — ABNORMAL HIGH (ref 70–99)

## 2011-09-23 LAB — CBC
HCT: 35.6 % — ABNORMAL LOW (ref 36.0–46.0)
Hemoglobin: 12.4 g/dL (ref 12.0–15.0)
MCH: 29.7 pg (ref 26.0–34.0)
MCV: 85.4 fL (ref 78.0–100.0)
RBC: 4.17 MIL/uL (ref 3.87–5.11)

## 2011-09-23 MED ORDER — HYDROCODONE-ACETAMINOPHEN 5-325 MG PO TABS: 1.0000 | ORAL_TABLET | ORAL | Status: AC | PRN

## 2011-09-23 MED ORDER — SODIUM CHLORIDE 0.9 % IV BOLUS (SEPSIS): 1000.0000 mL | Freq: Once | INTRAVENOUS | Status: DC

## 2011-09-23 MED ORDER — CLINDAMYCIN HCL 150 MG PO CAPS: 300.0000 mg | ORAL_CAPSULE | Freq: Three times a day (TID) | ORAL | Status: AC

## 2011-09-23 MED ORDER — SODIUM CHLORIDE 0.9 % IV BOLUS (SEPSIS)
500.0000 mL | INTRAVENOUS | Status: AC
  Administered 2011-09-23: 500 mL via INTRAVENOUS

## 2011-09-23 MED ORDER — CLINDAMYCIN PHOSPHATE 600 MG/50ML IV SOLN
600.0000 mg | Freq: Once | INTRAVENOUS | Status: AC
  Administered 2011-09-23: 600 mg via INTRAVENOUS
  Filled 2011-09-23: qty 50

## 2011-09-23 NOTE — ED Provider Notes (Signed)
History     CSN: [id number]  Arrival date & time 09/23/11  0916   First MD Initiated Contact with Patient 09/23/11 1001      Chief Complaint  Patient presents with  . Cellulitis    bilar LE"s    (Consider location/radiation/quality/duration/timing/severity/associated sxs/prior treatment) HPI History provided by pt.   Pt has h/o chronic venous stasis since approx 04/2011.  Presents today w/ increased erythema and pain, as well a draining blisters bilateral lower legs x 1 week.  No associated fever.  No trauma.  Per prior chart, pt treated for cellulitis of lower legs by [facility] in 07/2011.  When she was seen for a broken toe in ED on 08/16/2011, there were no signs of infection in LEs.  Her PCP at prescribed her bactrim cream TID in the past which she has been applying but recently ran out of.     Past Medical History  Diagnosis Date  . Diabetes mellitus   . Hypertension   . Hypothyroidism   . Cataract     Left eye( Dr. Dione Booze, patient uninsured so she can't  get  an intervention)  . Chronic kidney disease     nephrolithiasis, s/p removal 12/05/07  . Back pain     myofascial, excacerberated in 6-7/09, 9/09- required narcotics at those times  . Elbow fracture, right   . Obesity   . Depression     better on nortriptyline  . Hyperplastic colon polyp 2001    f/u colonoscopy 11/23/06 was normal( Dr. Leone Payor), falls into normal risk screening( next colonoscopy due in 2018)  . Pap smear for cervical cancer screening     12/14/2005: normal, evidence of candida. 05/16/2007: normal, benign reperative changes.    History reviewed. No pertinent past surgical history.  No family history on file.  History  Substance Use Topics  . Smoking status: Never Smoker   . Smokeless tobacco: Not on file  . Alcohol Use: No    OB History    Grav Para Term Preterm Abortions TAB SAB Ect Mult Living                  Review of Systems  All other systems reviewed and are negative.    Allergies    Review of patient's allergies indicates no known allergies.  Home Medications   Current Outpatient Rx  Name Route Sig Dispense Refill  . ASPIRIN 81 MG PO TABS Oral Take 81 mg by mouth daily.      . DULOXETINE HCL 30 MG PO CPEP Oral Take 30 mg by mouth daily.      . DULOXETINE HCL 60 MG PO CPEP Oral Take 60 mg by mouth every evening.      . INSULIN GLARGINE 100 UNIT/ML [place] SOLN Subcutaneous Inject 70 Units into the skin at bedtime.      Marland Kitchen LATANOPROST 0.005 % OP SOLN Both Eyes Place 1 drop into both eyes at bedtime.      Marland Kitchen LEVOTHYROXINE SODIUM 150 MCG PO TABS Oral Take 150 mcg by mouth daily.      . MELOXICAM 15 MG PO TABS Oral Take 15 mg by mouth daily.      Marland Kitchen METOPROLOL-HYDROCHLOROTHIAZIDE 50-25 MG PO TABS Oral Take 1 tablet by mouth daily.        BP 147/86  Pulse 84  Temp(Src) 97.9 F (36.6 C) (Oral)  Resp 20  Ht 5\' 4"  (1.626 m)  Wt 270 lb (122.471 kg)  BMI 46.35 kg/m2  SpO2  99%  Physical Exam  Nursing note and vitals reviewed. Constitutional: She is oriented to person, place, and time. She appears well-developed and well-nourished. No distress.  HENT:  Head: Normocephalic and atraumatic.  Eyes:       Normal appearance  Neck: Normal range of motion.  Cardiovascular: Normal rate and regular rhythm.   Pulmonary/Chest: Effort normal and breath sounds normal.  Musculoskeletal:       Erythema bilateral lower half of anterior lower legs that wraps around both medially and laterally.   Trace edema.   Left lower leg with multiple fluid-filled bullae.  Ttp.  2+ DP pulses and sensation intact bilaterally.   Neurological: She is alert and oriented to person, place, and time.  Skin: Skin is warm and dry. No rash noted.  Psychiatric: She has a normal mood and affect. Her behavior is normal.    ED Course  Procedures (including critical care time)  Labs Reviewed  CBC - Abnormal; Notable for the following:    HCT 35.6 (*)    All other components within normal limits  BASIC  METABOLIC PANEL - Abnormal; Notable for the following:    Glucose, Bld 213 (*)    BUN 32 (*)    Creatinine, Ser 1.85 (*)    GFR calc non Af Amer 29 (*)    GFR calc Af Amer 33 (*)    All other components within normal limits   No results found.   1. Cellulitis of left lower leg   2. Cellulitis of right lower leg       MDM  Pt w/ h/o chronic venous stasis and poorly controlled diabetes presents w/ cellulitis bilateral lower legs.  Increasing erythema/pain x 1 week. Has not recently been on abx.  Well appearing, afebrile and VS w/in nml limits on exam.  Pt receiving 600mg  IV Clindamycin.  She declines pain medication at this time.  Dispo will depend on basic labs which are pending.    BG elevated at 213 w/out acidosis.  Cr elevated at 1.85 but per prior chart, it appears that pt has chronic renal insufficiency.  Received 500mg  IV NS bolus.  Dr. Karma Ganja has examined and agrees that it is safe to d/c home w/ abx.  Prescribed clindamycin and vicodin.  Recommended PCP f/u on Monday or Tuesday.  Strict return precautions discussed.        Otilio Miu, [place] 09/23/11 1228

## 2011-09-23 NOTE — ED Provider Notes (Signed)
Medical screening examination/treatment/procedure(s) were conducted as a shared visit with non-physician practitioner(s) and myself.  I personally evaluated the patient during the encounter  Pt seen and examined.  Bilateral lower extremities with erythema, superficial blistering.  Pt nontoxic and well hdyrated appearing.  Labaratory evaluation reassuring, pt given IV clindamycin in ED.  Discharged home with po antibiotics.   Ethelda Chick, MD 09/23/11 [phone]

## 2011-09-23 NOTE — ED Notes (Signed)
Pt states she has had boils and swelling on both of her legs for months. Pt states it started back oct and has not healed. Pt states she is using abx cream from her pcp but the abx cream is not working. Pt states her legs burn and are painful. Pt pcp will not be back into his office  Until Monday and she is unable to wait that long

## 2012-01-17 ENCOUNTER — Emergency Department (HOSPITAL_COMMUNITY): Payer: Medicare Other

## 2012-01-17 ENCOUNTER — Inpatient Hospital Stay (HOSPITAL_COMMUNITY)
Admission: EM | Admit: 2012-01-17 | Discharge: 2012-01-20 | DRG: 683 | Disposition: A | Discharge status: Home / Self Care | Payer: Medicare Other | Attending: Internal Medicine | Admitting: Internal Medicine

## 2012-01-17 ENCOUNTER — Encounter (HOSPITAL_COMMUNITY): Payer: Self-pay

## 2012-01-17 DIAGNOSIS — N179 Acute kidney failure, unspecified: Secondary | ICD-10-CM

## 2012-01-17 DIAGNOSIS — N200 Calculus of kidney: Secondary | ICD-10-CM

## 2012-01-17 DIAGNOSIS — E669 Obesity, unspecified: Secondary | ICD-10-CM | POA: Diagnosis present

## 2012-01-17 DIAGNOSIS — F329 Major depressive disorder, single episode, unspecified: Secondary | ICD-10-CM

## 2012-01-17 DIAGNOSIS — M549 Dorsalgia, unspecified: Secondary | ICD-10-CM

## 2012-01-17 DIAGNOSIS — N183 Chronic kidney disease, stage 3 unspecified: Secondary | ICD-10-CM | POA: Diagnosis present

## 2012-01-17 DIAGNOSIS — K029 Dental caries, unspecified: Secondary | ICD-10-CM

## 2012-01-17 DIAGNOSIS — E785 Hyperlipidemia, unspecified: Secondary | ICD-10-CM

## 2012-01-17 DIAGNOSIS — I129 Hypertensive chronic kidney disease with stage 1 through stage 4 chronic kidney disease, or unspecified chronic kidney disease: Secondary | ICD-10-CM | POA: Diagnosis present

## 2012-01-17 DIAGNOSIS — R52 Pain, unspecified: Secondary | ICD-10-CM

## 2012-01-17 DIAGNOSIS — IMO0002 Reserved for concepts with insufficient information to code with codable children: Secondary | ICD-10-CM

## 2012-01-17 DIAGNOSIS — N19 Unspecified kidney failure: Secondary | ICD-10-CM

## 2012-01-17 DIAGNOSIS — L408 Other psoriasis: Secondary | ICD-10-CM

## 2012-01-17 DIAGNOSIS — E1140 Type 2 diabetes mellitus with diabetic neuropathy, unspecified: Secondary | ICD-10-CM | POA: Diagnosis present

## 2012-01-17 DIAGNOSIS — R739 Hyperglycemia, unspecified: Secondary | ICD-10-CM

## 2012-01-17 DIAGNOSIS — N1330 Unspecified hydronephrosis: Secondary | ICD-10-CM | POA: Diagnosis present

## 2012-01-17 DIAGNOSIS — Z6841 Body Mass Index (BMI) 40.0 and over, adult: Secondary | ICD-10-CM

## 2012-01-17 DIAGNOSIS — R1031 Right lower quadrant pain: Secondary | ICD-10-CM | POA: Diagnosis present

## 2012-01-17 DIAGNOSIS — J301 Allergic rhinitis due to pollen: Secondary | ICD-10-CM

## 2012-01-17 DIAGNOSIS — H9190 Unspecified hearing loss, unspecified ear: Secondary | ICD-10-CM

## 2012-01-17 DIAGNOSIS — E039 Hypothyroidism, unspecified: Secondary | ICD-10-CM

## 2012-01-17 DIAGNOSIS — N201 Calculus of ureter: Secondary | ICD-10-CM | POA: Diagnosis present

## 2012-01-17 DIAGNOSIS — I10 Essential (primary) hypertension: Secondary | ICD-10-CM | POA: Diagnosis present

## 2012-01-17 DIAGNOSIS — E119 Type 2 diabetes mellitus without complications: Secondary | ICD-10-CM

## 2012-01-17 LAB — COMPREHENSIVE METABOLIC PANEL
ALT: 15 U/L (ref 0–35)
CO2: 22 mEq/L (ref 19–32)
Calcium: 9.2 mg/dL (ref 8.4–10.5)
GFR calc Af Amer: 10 mL/min — ABNORMAL LOW (ref >90)
GFR calc non Af Amer: 9 mL/min — ABNORMAL LOW (ref >90)
Glucose, Bld: 398 mg/dL — ABNORMAL HIGH (ref 70–99)
Sodium: 131 mEq/L — ABNORMAL LOW (ref 135–145)

## 2012-01-17 LAB — URINALYSIS, ROUTINE W REFLEX MICROSCOPIC
Glucose, UA: 500 mg/dL — AB
Ketones, ur: NEGATIVE mg/dL
Protein, ur: 30 mg/dL — AB

## 2012-01-17 LAB — URINE MICROSCOPIC-ADD ON

## 2012-01-17 LAB — CBC
Hemoglobin: 11.5 g/dL — ABNORMAL LOW (ref 12.0–15.0)
MCHC: 34.5 g/dL (ref 30.0–36.0)
Platelets: 260 10*3/uL (ref 150–400)
RDW: 14.4 % (ref 11.5–15.5)

## 2012-01-17 MED ORDER — KETOROLAC TROMETHAMINE 30 MG/ML IJ SOLN
30.0000 mg | Freq: Once | INTRAMUSCULAR | Status: AC
  Administered 2012-01-17: 30 mg via INTRAVENOUS
  Filled 2012-01-17: qty 1

## 2012-01-17 MED ORDER — HYDROCODONE-ACETAMINOPHEN 5-325 MG PO TABS
1.0000 | ORAL_TABLET | ORAL | Status: DC | PRN
  Administered 2012-01-18 – 2012-01-20 (×6): 2 via ORAL
  Filled 2012-01-17 (×7): qty 2

## 2012-01-17 MED ORDER — SODIUM CHLORIDE 0.9 % IV SOLN
INTRAVENOUS | Status: AC
  Administered 2012-01-17: 23:00:00 via INTRAVENOUS

## 2012-01-17 MED ORDER — HYDROCHLOROTHIAZIDE 25 MG PO TABS
25.0000 mg | ORAL_TABLET | Freq: Every day | ORAL | Status: DC
  Filled 2012-01-17: qty 1

## 2012-01-17 MED ORDER — ONDANSETRON HCL 4 MG PO TABS: 4.0000 mg | ORAL_TABLET | Freq: Four times a day (QID) | ORAL | Status: DC | PRN

## 2012-01-17 MED ORDER — DULOXETINE HCL 30 MG PO CPEP
30.0000 mg | ORAL_CAPSULE | Freq: Every day | ORAL | Status: DC
  Administered 2012-01-19 – 2012-01-20 (×2): 30 mg via ORAL
  Filled 2012-01-17 (×3): qty 1

## 2012-01-17 MED ORDER — ONDANSETRON HCL 4 MG/2ML IJ SOLN
4.0000 mg | Freq: Four times a day (QID) | INTRAMUSCULAR | Status: DC | PRN
  Administered 2012-01-18 – 2012-01-19 (×2): 4 mg via INTRAVENOUS
  Filled 2012-01-17 (×2): qty 2

## 2012-01-17 MED ORDER — METOPROLOL-HYDROCHLOROTHIAZIDE 50-25 MG PO TABS: 1.0000 | ORAL_TABLET | Freq: Every day | ORAL | Status: DC

## 2012-01-17 MED ORDER — DEXTROSE 5 % IV SOLN
INTRAVENOUS | Status: AC
  Administered 2012-01-17: 21:00:00
  Filled 2012-01-17: qty 10

## 2012-01-17 MED ORDER — DULOXETINE HCL 60 MG PO CPEP
60.0000 mg | ORAL_CAPSULE | Freq: Every day | ORAL | Status: DC
  Administered 2012-01-18 – 2012-01-19 (×3): 60 mg via ORAL
  Filled 2012-01-17 (×4): qty 1

## 2012-01-17 MED ORDER — DEXTROSE 5 % IV SOLN
1.0000 g | INTRAVENOUS | Status: DC
  Administered 2012-01-17 – 2012-01-19 (×3): 1 g via INTRAVENOUS
  Filled 2012-01-17 (×2): qty 10

## 2012-01-17 MED ORDER — ONDANSETRON HCL 4 MG/2ML IJ SOLN
4.0000 mg | Freq: Once | INTRAMUSCULAR | Status: AC
  Administered 2012-01-17: 4 mg via INTRAVENOUS
  Filled 2012-01-17: qty 2

## 2012-01-17 MED ORDER — LEVOTHYROXINE SODIUM 150 MCG PO TABS
150.0000 ug | ORAL_TABLET | Freq: Every day | ORAL | Status: DC
  Administered 2012-01-18 – 2012-01-20 (×3): 150 ug via ORAL
  Filled 2012-01-17 (×5): qty 1

## 2012-01-17 MED ORDER — SODIUM CHLORIDE 0.9 % IV SOLN
Freq: Once | INTRAVENOUS | Status: AC
  Administered 2012-01-17: 20:00:00 via INTRAVENOUS

## 2012-01-17 MED ORDER — LATANOPROST 0.005 % OP SOLN
1.0000 [drp] | Freq: Every day | OPHTHALMIC | Status: DC
  Administered 2012-01-18 – 2012-01-19 (×3): 1 [drp] via OPHTHALMIC
  Filled 2012-01-17: qty 2.5

## 2012-01-17 MED ORDER — INSULIN GLARGINE 100 UNIT/ML ~~LOC~~ SOLN
70.0000 [IU] | Freq: Every day | SUBCUTANEOUS | Status: DC
  Administered 2012-01-17 – 2012-01-19 (×3): 70 [IU] via SUBCUTANEOUS

## 2012-01-17 MED ORDER — INSULIN ASPART 100 UNIT/ML ~~LOC~~ SOLN
0.0000 [IU] | Freq: Three times a day (TID) | SUBCUTANEOUS | Status: DC
  Administered 2012-01-18: 2 [IU] via SUBCUTANEOUS
  Administered 2012-01-18: 1 [IU] via SUBCUTANEOUS
  Administered 2012-01-18: 2 [IU] via SUBCUTANEOUS
  Administered 2012-01-19: 3 [IU] via SUBCUTANEOUS
  Administered 2012-01-20: 2 [IU] via SUBCUTANEOUS

## 2012-01-17 MED ORDER — METOPROLOL TARTRATE 50 MG PO TABS
50.0000 mg | ORAL_TABLET | Freq: Every day | ORAL | Status: DC
  Administered 2012-01-18 – 2012-01-20 (×3): 50 mg via ORAL
  Filled 2012-01-17 (×3): qty 1

## 2012-01-17 MED ORDER — HYDROMORPHONE HCL PF 1 MG/ML IJ SOLN
1.0000 mg | Freq: Once | INTRAMUSCULAR | Status: AC
  Administered 2012-01-17: 1 mg via INTRAVENOUS
  Filled 2012-01-17: qty 1

## 2012-01-17 NOTE — Consult Note (Signed)
Urology Consult  Referring physician: Dr. Azalia Bilis from the emergency room. Reason for referral: Right hydronephrosis with multiple right ureteral calculi  History of Present Illness: Patient is 61 years of age with a history of nephrolithiasis. She was last seen and treated in our office in 2009 by Dr. Vernie Ammons when with a ureteral calculus. She has not had any more recent followup. Patient is now being admitted with right-sided abdominal and flank discomfort and acute renal failure. Patient reported several days of general malaise fatigue and some right-sided abdominal pain. She has had mild nausea but no fever or chills. She has had no gross hematuria. She has had some bladder pressure but no other voiding complaints. CT in the emergency room showed moderate to severe right hydroureteronephrosis secondary to multiple stones in the distal ureter. She had some left renal calculi that were nonobstructing. The patient's creatinine was noted to be close to 5. Baseline creatinine appears to be in the 1.5-1.9 range. She is currently admitted to the medical/hospitalist service.  Past Medical History  Diagnosis Date  . Diabetes mellitus   . Hypertension   . Hypothyroidism   . Cataract     Left eye( Dr. Dione Booze, patient uninsured so she can't  get  an intervention)  . Chronic kidney disease     nephrolithiasis, s/p removal 12/05/07  . Back pain     myofascial, excacerberated in 6-7/09, 9/09- required narcotics at those times  . Elbow fracture, right   . Obesity   . Depression     better on nortriptyline  . Hyperplastic colon polyp 2001    f/u colonoscopy 11/23/06 was normal( Dr. Leone Payor), falls into normal risk screening( next colonoscopy due in 2018)  . Pap smear for cervical cancer screening     12/14/2005: normal, evidence of candida. 05/16/2007: normal, benign reperative changes.   History reviewed. No pertinent past surgical history.  Medications:  Prior to Admission:  Prescriptions prior  to admission  Medication Sig Dispense Refill  . aspirin 81 MG tablet Take 81 mg by mouth daily.        . DULoxetine (CYMBALTA) 30 MG capsule Take 30 mg by mouth daily.        . DULoxetine (CYMBALTA) 60 MG capsule Take 60 mg by mouth every evening.        . insulin glargine (LANTUS) 100 UNIT/ML injection Inject 70 Units into the skin at bedtime.        Marland Kitchen latanoprost (XALATAN) 0.005 % ophthalmic solution Place 1 drop into both eyes at bedtime.        Marland Kitchen levothyroxine (SYNTHROID, LEVOTHROID) 150 MCG tablet Take 150 mcg by mouth daily.        . Liraglutide (VICTOZA) 18 MG/3ML SOLN Inject 1.2 mg into the skin daily.       . meloxicam (MOBIC) 15 MG tablet Take 15 mg by mouth daily.      . metoprolol-hydrochlorothiazide (LOPRESSOR HCT) 50-25 MG per tablet Take 1 tablet by mouth daily.        . polyvinyl alcohol (LIQUIFILM TEARS) 1.4 % ophthalmic solution Place 1 drop into both eyes as needed. For dryness        Allergies: No Known Allergies  No family history on file.  Social History:  reports that she has never smoked. She does not have any smokeless tobacco history on file. She reports that she does not drink alcohol or use illicit drugs.  Complains primarily of issues related above. This includes abdominal pain with  some nausea, back pain and bladder pressure. Generalized fatigue and malaise. Otherwise negative.  Physical Exam:  Vital signs in last 24 hours: Temp:  [98.8 F (37.1 C)] 98.8 F (37.1 C) (06/04 1736) Pulse Rate:  [80] 80  (06/04 1736) Resp:  [20] 20  (06/04 1736) BP: (137)/(73) 137/73 mmHg (06/04 1736) SpO2:  [96 %] 96 % (06/04 1736)  Constitutional: Vital signs reviewed. WD WN in NAD Head: Normocephalic and atraumatic   Eyes: PERRL, No scleral icterus.  Neck: Supple No  Gross JVD, mass, thyromegaly, or carotid bruit present.  Cardiovascular: RRR Pulmonary/Chest: Normal effort Abdominal: Soft. Non-tender, non-distended. Obese abdomen. Genitourinary: Not  examined. Extremities: No cyanosis or edema  Neurological: Grossly non-focal.  Skin: Warm,very dry and intact. No rash, cyanosis   Laboratory Data:  Results for orders placed during the hospital encounter of 01/17/12 (from the past 72 hour(s))  URINALYSIS, ROUTINE W REFLEX MICROSCOPIC     Status: Abnormal   Collection Time   01/17/12  5:44 PM      Component Value Range Comment   Color, Urine YELLOW  YELLOW     APPearance CLOUDY (*) CLEAR     Specific Gravity, Urine 1.020  1.005 - 1.030     pH 5.5  5.0 - 8.0     Glucose, UA 500 (*) NEGATIVE (mg/dL)    Hgb urine dipstick LARGE (*) NEGATIVE     Bilirubin Urine NEGATIVE  NEGATIVE     Ketones, ur NEGATIVE  NEGATIVE (mg/dL)    Protein, ur 30 (*) NEGATIVE (mg/dL)    Urobilinogen, UA 0.2  0.0 - 1.0 (mg/dL)    Nitrite NEGATIVE  NEGATIVE     Leukocytes, UA SMALL (*) NEGATIVE    URINE MICROSCOPIC-ADD ON     Status: Abnormal   Collection Time   01/17/12  5:44 PM      Component Value Range Comment   Squamous Epithelial / LPF FEW (*) RARE     RBC / HPF TOO NUMEROUS TO COUNT  <3 (RBC/hpf)    Bacteria, UA RARE  RARE     Casts HYALINE CASTS (*) NEGATIVE    COMPREHENSIVE METABOLIC PANEL     Status: Abnormal   Collection Time   01/17/12  6:58 PM      Component Value Range Comment   Sodium 131 (*) 135 - 145 (mEq/L)    Potassium 4.2  3.5 - 5.1 (mEq/L)    Chloride 96  96 - 112 (mEq/L)    CO2 22  19 - 32 (mEq/L)    Glucose, Bld 398 (*) 70 - 99 (mg/dL)    BUN 61 (*) 6 - 23 (mg/dL)    Creatinine, Ser 1.61 (*) 0.50 - 1.10 (mg/dL)    Calcium 9.2  8.4 - 10.5 (mg/dL)    Total Protein 7.4  6.0 - 8.3 (g/dL)    Albumin 3.4 (*) 3.5 - 5.2 (g/dL)    AST 10  0 - 37 (U/L)    ALT 15  0 - 35 (U/L)    Alkaline Phosphatase 99  39 - 117 (U/L)    Total Bilirubin 0.2 (*) 0.3 - 1.2 (mg/dL)    GFR calc non Af Amer 9 (*) >90 (mL/min)    GFR calc Af Amer 10 (*) >90 (mL/min)   CBC     Status: Abnormal   Collection Time   01/17/12  6:58 PM      Component Value Range  Comment   WBC 11.2 (*) 4.0 - 10.5 (K/uL)  RBC 3.81 (*) 3.87 - 5.11 (MIL/uL)    Hemoglobin 11.5 (*) 12.0 - 15.0 (g/dL)    HCT 04.5 (*) 40.9 - 46.0 (%)    MCV 87.4  78.0 - 100.0 (fL)    MCH 30.2  26.0 - 34.0 (pg)    MCHC 34.5  30.0 - 36.0 (g/dL)    RDW 81.1  91.4 - 78.2 (%)    Platelets 260  150 - 400 (K/uL)    No results found for this or any previous visit (from the past 240 hour(s)). Creatinine:  Basename 01/17/12 1858  CREATININE 4.98*   Baseline Creatinine: 1.5 in 2010 and 1.85 February of 2013  Impression/Assessment:  Acute renal failure secondary to right-sided renal obstruction probable baseline chronic renal insufficiency secondary to diabetes mellitus and hypertension. The patient will require definitive treatment of the right ureteral calculi/possible initial temporizing double-J stent placement for drainage of the right renal unit and hopeful improvement in overall renal function. No indication for emergent stenting or nephrostomy tube placement this evening. We will ask the patient to be n.p.o. after midnight in anticipation of potential procedure tomorrow by Dr. Vernie Ammons or the on-call physician for [facility] at that time.  Plan:  As above  Rolland Steinert S 01/17/2012, 9:37 PM

## 2012-01-17 NOTE — H&P (Signed)
PCP:   Jearld Lesch, MD, MD   Chief complaint right flank pain  History of present illness 61 year old female who's been having right flank pain that radiates to the right lower quadrant for approximately 48 hours. She's noted some blood in her urine. She has a history of kidney stones in the past. She denies any fevers. She denies any nausea or vomiting. She has had decreased by mouth intake over the last day. She is found to have renal failure and nephrolithiasis with significant hydronephrosis.    Review of Systems:  Otherwise negative  Past Medical History: Past Medical History  Diagnosis Date  . Diabetes mellitus   . Hypertension   . Hypothyroidism   . Cataract     Left eye( Dr. Dione Booze, patient uninsured so she can't  get  an intervention)  . Chronic kidney disease     nephrolithiasis, s/p removal 12/05/07  . Back pain     myofascial, excacerberated in 6-7/09, 9/09- required narcotics at those times  . Elbow fracture, right   . Obesity   . Depression     better on nortriptyline  . Hyperplastic colon polyp 2001    f/u colonoscopy 11/23/06 was normal( Dr. Leone Payor), falls into normal risk screening( next colonoscopy due in 2018)  . Pap smear for cervical cancer screening     12/14/2005: normal, evidence of candida. 05/16/2007: normal, benign reperative changes.   History reviewed. No pertinent past surgical history.  Medications: Prior to Admission medications   Medication Sig Start Date End Date Taking? Authorizing Provider  aspirin 81 MG tablet Take 81 mg by mouth daily.     Yes Historical Provider, MD  DULoxetine (CYMBALTA) 30 MG capsule Take 30 mg by mouth daily.     Yes Historical Provider, MD  DULoxetine (CYMBALTA) 60 MG capsule Take 60 mg by mouth every evening.     Yes Historical Provider, MD  insulin glargine (LANTUS) 100 UNIT/ML injection Inject 70 Units into the skin at bedtime.     Yes Historical Provider, MD  latanoprost (XALATAN) 0.005 % ophthalmic solution  Place 1 drop into both eyes at bedtime.     Yes Historical Provider, MD  levothyroxine (SYNTHROID, LEVOTHROID) 150 MCG tablet Take 150 mcg by mouth daily.     Yes Historical Provider, MD  Liraglutide (VICTOZA) 18 MG/3ML SOLN Inject 1.2 mg into the skin daily.    Yes Historical Provider, MD  meloxicam (MOBIC) 15 MG tablet Take 15 mg by mouth daily.   Yes Historical Provider, MD  metoprolol-hydrochlorothiazide (LOPRESSOR HCT) 50-25 MG per tablet Take 1 tablet by mouth daily.     Yes Historical Provider, MD  polyvinyl alcohol (LIQUIFILM TEARS) 1.4 % ophthalmic solution Place 1 drop into both eyes as needed. For dryness   Yes Historical Provider, MD    Allergies:  No Known Allergies  Social History:  reports that she has never smoked. She does not have any smokeless tobacco history on file. She reports that she does not drink alcohol or use illicit drugs.   Physical Exam: Filed Vitals:   01/17/12 1736  BP: 137/73  Pulse: 80  Temp: 98.8 F (37.1 C)  TempSrc: Oral  Resp: 20  SpO2: 96%   General appearance: alert, cooperative and no distress Lungs: clear to auscultation bilaterally Heart: regular rate and rhythm, S1, S2 normal, no murmur, click, rub or gallop Abdomen: soft, non-tender; bowel sounds normal; no masses,  no organomegaly Extremities: extremities normal, atraumatic, no cyanosis or edema Pulses: 2+ and  symmetric Skin: Skin color, texture, turgor normal. No rashes or lesions Neurologic: Grossly normal    Labs on Admission:   [facility] 01/17/12 1858  NA 131*  K 4.2  CL 96  CO2 22  GLUCOSE 398*  BUN 61*  CREATININE 4.98*  CALCIUM 9.2  MG --  PHOS --    Basename 01/17/12 1858  AST 10  ALT 15  ALKPHOS 99  BILITOT 0.2*  PROT 7.4  ALBUMIN 3.4*    Basename 01/17/12 1858  WBC 11.2*  NEUTROABS --  HGB 11.5*  HCT 33.3*  MCV 87.4  PLT 260    Radiological Exams on Admission: Ct Abdomen Pelvis Wo Contrast  01/17/2012  *RADIOLOGY REPORT*  Clinical Data:  Right flank pain.  CT ABDOMEN AND PELVIS WITHOUT CONTRAST  Technique:  Multidetector CT imaging of the abdomen and pelvis was performed following the standard protocol without intravenous contrast.  Comparison: 04/24/2009  Findings: Stable punctate nodule at the left lung base.  No evidence for free air. Unenhanced CT was performed per clinician order.  Lack of IV contrast limits sensitivity and specificity, especially for evaluation of abdominal/pelvic solid viscera.  There is moderate to severe right hydroureteronephrosis.  There is stranding around the mid right ureter.  There are multiple stones in the distal right ureter that extend to the right ureterovesical junction.  The most distal stone measures up to 0.6 cm. There is stranding and edema around the right kidney.  There are additional small stones within the right kidney and there are multiple stones in the left kidney.  Index left kidney stone roughly measures 0.5 cm.  There is no left hydronephrosis.  No gross abnormality to the liver, gallbladder, spleen, pancreas or adrenal glands.  Again noted are small retroperitoneal lymph nodes which have not significantly changed.  No gross abnormality to the uterus or adnexa tissue.  No acute bony abnormality.  IMPRESSION: Moderate to severe right hydroureteronephrosis due to multiple stones in the distal right ureter.  The stones extend to the right ureterovesical junction.  Bilateral renal calculi.  Original Report Authenticated By: Richarda Overlie, M.D.    Assessment/Plan Present on Admission:  61 year old female with worsening renal failure, and nephrolithiasis.  .Acute renal failure .DIABETES MELLITUS, TYPE II .HYPERTENSION .Hydronephrosis .Nephrolithiasis  Urology is already been called and will be seeing the patient in consultation. I will empirically cover her with Rocephin urine culture has been sent. We'll place on IV fluids overnight. Repeat a creatinine the morning. Place on sliding scale  insulin for diabetes. Further recommendations depending on urology evaluation.   Lashon Beringer [phone] 01/17/2012, 9:17 PM

## 2012-01-17 NOTE — ED Provider Notes (Signed)
Medical screening examination/treatment/procedure(s) were conducted as a shared visit with non-physician practitioner(s) and myself.  I personally evaluated the patient during the encounter  The patient presents with several right-sided ureteral stones and hydronephrosis.  She also has new renal failure with elevation of BUN and creatinine from baseline.  She does not look toxic at this time.  She has not like uroseptic.  Her urine is not convincing.  I told the hospitalist a dose of Rocephin would not be unreasonable tonightt.  I discussed the case with Dr. Isabel Caprice urology who will evaluate patient later tonight or his partner will see her first thing in the morning.  At this time it does not sound likely need to intervene acutely on these right-sided ureteral stones.  I suspect the majority of her renal failure secondary to dehydration  1. Nephrolithiasis   2. Renal failure   3. Hyperglycemia    Ct Abdomen Pelvis Wo Contrast  01/17/2012  *RADIOLOGY REPORT*  Clinical Data: Right flank pain.  CT ABDOMEN AND PELVIS WITHOUT CONTRAST  Technique:  Multidetector CT imaging of the abdomen and pelvis was performed following the standard protocol without intravenous contrast.  Comparison: 04/24/2009  Findings: Stable punctate nodule at the left lung base.  No evidence for free air. Unenhanced CT was performed per clinician order.  Lack of IV contrast limits sensitivity and specificity, especially for evaluation of abdominal/pelvic solid viscera.  There is moderate to severe right hydroureteronephrosis.  There is stranding around the mid right ureter.  There are multiple stones in the distal right ureter that extend to the right ureterovesical junction.  The most distal stone measures up to 0.6 cm. There is stranding and edema around the right kidney.  There are additional small stones within the right kidney and there are multiple stones in the left kidney.  Index left kidney stone roughly measures 0.5 cm.  There is  no left hydronephrosis.  No gross abnormality to the liver, gallbladder, spleen, pancreas or adrenal glands.  Again noted are small retroperitoneal lymph nodes which have not significantly changed.  No gross abnormality to the uterus or adnexa tissue.  No acute bony abnormality.  IMPRESSION: Moderate to severe right hydroureteronephrosis due to multiple stones in the distal right ureter.  The stones extend to the right ureterovesical junction.  Bilateral renal calculi.  Original Report Authenticated By: Richarda Overlie, M.D.    Lyanne Co, MD 01/17/12 [phone]

## 2012-01-17 NOTE — ED Notes (Signed)
Pt c/o rt side flank pain radiating down to groin area with urinary difficulty since yesterday 4am, states hx of kidney stones, states no appetite but denies n/v/d

## 2012-01-17 NOTE — ED Provider Notes (Signed)
History     CSN: [id number]  Arrival date & time 01/17/12  1615   First MD Initiated Contact with Patient 01/17/12 1829      Chief Complaint  Patient presents with  . Nephrolithiasis    rt flank pain    (Consider location/radiation/quality/duration/timing/severity/associated sxs/prior treatment) Patient is a 61 y.o. female presenting with flank pain. The history is provided by the patient.  Flank Pain This is a new problem. The current episode started yesterday. The problem occurs constantly. The problem has been unchanged. Associated symptoms include abdominal pain. Pertinent negatives include no anorexia, chest pain, chills, fever, nausea or vomiting. The symptoms are aggravated by nothing. She has tried nothing for the symptoms.  Pt states symptoms started suddenly. States pain radiates from right flank into right lower abdomen. States also having dysuria, frequency. Denies fever, chills. States hx of pyelonephritis and kidney stones, unsure which one it is. States pain 9/10.  Past Medical History  Diagnosis Date  . Diabetes mellitus   . Hypertension   . Hypothyroidism   . Cataract     Left eye( Dr. Dione Booze, patient uninsured so she can't  get  an intervention)  . Chronic kidney disease     nephrolithiasis, s/p removal 12/05/07  . Back pain     myofascial, excacerberated in 6-7/09, 9/09- required narcotics at those times  . Elbow fracture, right   . Obesity   . Depression     better on nortriptyline  . Hyperplastic colon polyp 2001    f/u colonoscopy 11/23/06 was normal( Dr. Leone Payor), falls into normal risk screening( next colonoscopy due in 2018)  . Pap smear for cervical cancer screening     12/14/2005: normal, evidence of candida. 05/16/2007: normal, benign reperative changes.    History reviewed. No pertinent past surgical history.  No family history on file.  History  Substance Use Topics  . Smoking status: Never Smoker   . Smokeless tobacco: Not on file  . Alcohol  Use: No    OB History    Grav Para Term Preterm Abortions TAB SAB Ect Mult Living                  Review of Systems  Constitutional: Negative for fever and chills.  HENT: Negative.   Respiratory: Negative.   Cardiovascular: Negative for chest pain.  Gastrointestinal: Positive for abdominal pain. Negative for nausea, vomiting, diarrhea, constipation and anorexia.  Genitourinary: Positive for dysuria, frequency and flank pain. Negative for vaginal bleeding, vaginal discharge, vaginal pain and pelvic pain.  Musculoskeletal: Positive for back pain.  Skin: Negative.   Neurological: Negative.     Allergies  Review of patient's allergies indicates no known allergies.  Home Medications   Current Outpatient Rx  Name Route Sig Dispense Refill  . ASPIRIN 81 MG PO TABS Oral Take 81 mg by mouth daily.      . DULOXETINE HCL 30 MG PO CPEP Oral Take 30 mg by mouth daily.      . DULOXETINE HCL 60 MG PO CPEP Oral Take 60 mg by mouth every evening.      . INSULIN GLARGINE 100 UNIT/ML [place] SOLN Subcutaneous Inject 70 Units into the skin at bedtime.      Marland Kitchen LATANOPROST 0.005 % OP SOLN Both Eyes Place 1 drop into both eyes at bedtime.      Marland Kitchen LEVOTHYROXINE SODIUM 150 MCG PO TABS Oral Take 150 mcg by mouth daily.      Marland Kitchen LIRAGLUTIDE 18 MG/3ML [place]  SOLN Subcutaneous Inject 1.2 mg into the skin daily.     . MELOXICAM 15 MG PO TABS Oral Take 15 mg by mouth daily.    Marland Kitchen METOPROLOL-HYDROCHLOROTHIAZIDE 50-25 MG PO TABS Oral Take 1 tablet by mouth daily.      Marland Kitchen POLYVINYL ALCOHOL 1.4 % OP SOLN Both Eyes Place 1 drop into both eyes as needed. For dryness      BP 137/73  Pulse 80  Temp(Src) 98.8 F (37.1 C) (Oral)  Resp 20  SpO2 96%  Physical Exam  Nursing note and vitals reviewed. Constitutional: She is oriented to person, place, and time. She appears well-developed and well-nourished. No distress.  HENT:  Head: Normocephalic.  Eyes: Conjunctivae are normal.  Cardiovascular: Normal rate, regular rhythm  and normal heart sounds.   Pulmonary/Chest: Effort normal and breath sounds normal. No respiratory distress. She has no wheezes.  Abdominal: Soft. Bowel sounds are normal. She exhibits no distension. There is tenderness. There is no rebound.       RLQ tenderness. No guarding. No rebound tenderness. Right CVA tenderness  Musculoskeletal: Normal range of motion.  Neurological: She is alert and oriented to person, place, and time.  Skin: Skin is warm.  Psychiatric: She has a normal mood and affect.    ED Course  Procedures (including critical care time)  Pt with right flank pain, hx of kidney stones. Will get labs, CT abdomen  Results for orders placed during the hospital encounter of 01/17/12  URINALYSIS, ROUTINE W REFLEX MICROSCOPIC      Component Value Range   Color, Urine YELLOW  YELLOW    APPearance CLOUDY (*) CLEAR    Specific Gravity, Urine 1.020  1.005 - 1.030    pH 5.5  5.0 - 8.0    Glucose, UA 500 (*) NEGATIVE (mg/dL)   Hgb urine dipstick LARGE (*) NEGATIVE    Bilirubin Urine NEGATIVE  NEGATIVE    Ketones, ur NEGATIVE  NEGATIVE (mg/dL)   Protein, ur 30 (*) NEGATIVE (mg/dL)   Urobilinogen, UA 0.2  0.0 - 1.0 (mg/dL)   Nitrite NEGATIVE  NEGATIVE    Leukocytes, UA SMALL (*) NEGATIVE   COMPREHENSIVE METABOLIC PANEL      Component Value Range   Sodium 131 (*) 135 - 145 (mEq/L)   Potassium 4.2  3.5 - 5.1 (mEq/L)   Chloride 96  96 - 112 (mEq/L)   CO2 22  19 - 32 (mEq/L)   Glucose, Bld 398 (*) 70 - 99 (mg/dL)   BUN 61 (*) 6 - 23 (mg/dL)   Creatinine, Ser 1.61 (*) 0.50 - 1.10 (mg/dL)   Calcium 9.2  8.4 - 09.6 (mg/dL)   Total Protein 7.4  6.0 - 8.3 (g/dL)   Albumin 3.4 (*) 3.5 - 5.2 (g/dL)   AST 10  0 - 37 (U/L)   ALT 15  0 - 35 (U/L)   Alkaline Phosphatase 99  39 - 117 (U/L)   Total Bilirubin 0.2 (*) 0.3 - 1.2 (mg/dL)   GFR calc non Af Amer 9 (*) >90 (mL/min)   GFR calc Af Amer 10 (*) >90 (mL/min)  CBC      Component Value Range   WBC 11.2 (*) 4.0 - 10.5 (K/uL)   RBC  3.81 (*) 3.87 - 5.11 (MIL/uL)   Hemoglobin 11.5 (*) 12.0 - 15.0 (g/dL)   HCT 04.5 (*) 40.9 - 46.0 (%)   MCV 87.4  78.0 - 100.0 (fL)   MCH 30.2  26.0 - 34.0 (pg)  MCHC 34.5  30.0 - 36.0 (g/dL)   RDW 16.1  09.6 - 04.5 (%)   Platelets 260  150 - 400 (K/uL)  URINE MICROSCOPIC-ADD ON      Component Value Range   Squamous Epithelial / LPF FEW (*) RARE    RBC / HPF TOO NUMEROUS TO COUNT  <3 (RBC/hpf)   Bacteria, UA RARE  RARE    Casts HYALINE CASTS (*) NEGATIVE   URINE CULTURE      Component Value Range   Specimen Description URINE, RANDOM     Special Requests NONE     Culture  Setup Time [id number]     Colony Count >=100,000 COLONIES/ML     Culture       Value: Multiple bacterial morphotypes present, none predominant. Suggest appropriate recollection if clinically indicated.   Report Status 01/19/2012 FINAL    BASIC METABOLIC PANEL      Component Value Range   Sodium 134 (*) 135 - 145 (mEq/L)   Potassium 3.6  3.5 - 5.1 (mEq/L)   Chloride 100  96 - 112 (mEq/L)   CO2 22  19 - 32 (mEq/L)   Glucose, Bld 228 (*) 70 - 99 (mg/dL)   BUN 62 (*) 6 - 23 (mg/dL)   Creatinine, Ser 9.56 (*) 0.50 - 1.10 (mg/dL)   Calcium 8.6  8.4 - 21.3 (mg/dL)   GFR calc non Af Amer 8 (*) >90 (mL/min)   GFR calc Af Amer 9 (*) >90 (mL/min)  CBC      Component Value Range   WBC 8.6  4.0 - 10.5 (K/uL)   RBC 3.35 (*) 3.87 - 5.11 (MIL/uL)   Hemoglobin 10.2 (*) 12.0 - 15.0 (g/dL)   HCT 08.6 (*) 57.8 - 46.0 (%)   MCV 88.7  78.0 - 100.0 (fL)   MCH 30.4  26.0 - 34.0 (pg)   MCHC 34.3  30.0 - 36.0 (g/dL)   RDW 46.9  62.9 - 52.8 (%)   Platelets 230  150 - 400 (K/uL)  GLUCOSE, CAPILLARY      Component Value Range   Glucose-Capillary 235 (*) 70 - 99 (mg/dL)   Comment 1 Notify RN    GLUCOSE, CAPILLARY      Component Value Range   Glucose-Capillary 184 (*) 70 - 99 (mg/dL)   Comment 1 Notify RN     Comment 2 Documented in Chart    HEMOGLOBIN A1C      Component Value Range   Hemoglobin A1C 9.2 (*) <5.7 (%)    Mean Plasma Glucose 217 (*) <117 (mg/dL)  TSH      Component Value Range   TSH 1.843  0.350 - 4.500 (uIU/mL)  GLUCOSE, CAPILLARY      Component Value Range   Glucose-Capillary 163 (*) 70 - 99 (mg/dL)   Comment 1 Notify RN     Comment 2 Documented in Chart    SURGICAL PCR SCREEN      Component Value Range   MRSA, PCR NEGATIVE  NEGATIVE    Staphylococcus aureus NEGATIVE  NEGATIVE   GLUCOSE, CAPILLARY      Component Value Range   Glucose-Capillary 146 (*) 70 - 99 (mg/dL)   Comment 1 Notify RN     Comment 2 Documented in Chart    GLUCOSE, CAPILLARY      Component Value Range   Glucose-Capillary 263 (*) 70 - 99 (mg/dL)   Ct Abdomen Pelvis Wo Contrast  01/17/2012  *RADIOLOGY REPORT*  Clinical Data: Right flank pain.  CT ABDOMEN AND PELVIS WITHOUT CONTRAST  Technique:  Multidetector CT imaging of the abdomen and pelvis was performed following the standard protocol without intravenous contrast.  Comparison: 04/24/2009  Findings: Stable punctate nodule at the left lung base.  No evidence for free air. Unenhanced CT was performed per clinician order.  Lack of IV contrast limits sensitivity and specificity, especially for evaluation of abdominal/pelvic solid viscera.  There is moderate to severe right hydroureteronephrosis.  There is stranding around the mid right ureter.  There are multiple stones in the distal right ureter that extend to the right ureterovesical junction.  The most distal stone measures up to 0.6 cm. There is stranding and edema around the right kidney.  There are additional small stones within the right kidney and there are multiple stones in the left kidney.  Index left kidney stone roughly measures 0.5 cm.  There is no left hydronephrosis.  No gross abnormality to the liver, gallbladder, spleen, pancreas or adrenal glands.  Again noted are small retroperitoneal lymph nodes which have not significantly changed.  No gross abnormality to the uterus or adnexa tissue.  No acute bony  abnormality.  IMPRESSION: Moderate to severe right hydroureteronephrosis due to multiple stones in the distal right ureter.  The stones extend to the right ureterovesical junction.  Bilateral renal calculi.  Original Report Authenticated By: Richarda Overlie, M.D.    Pt with new renal failure, creatinine 4.98, multiple kidney stones in right ureter. WIll admit for renal failure and further management. I spoke with [facility], will admit. Pt signed out with Dr. Patria Mane at shift change, he will consult urology. Pt in NAD, pain improved with medications. VS normal.   1. Nephrolithiasis   2. Renal failure   3. Hyperglycemia       MDM          Lottie Mussel, PA 01/19/12 [phone]

## 2012-01-18 ENCOUNTER — Encounter (HOSPITAL_COMMUNITY)
Admission: EM | Disposition: A | Discharge status: Home / Self Care | Payer: Self-pay | Source: Home / Self Care | Attending: Internal Medicine

## 2012-01-18 ENCOUNTER — Encounter (HOSPITAL_COMMUNITY): Payer: Self-pay | Admitting: Anesthesiology

## 2012-01-18 ENCOUNTER — Other Ambulatory Visit: Payer: Self-pay | Admitting: Urology

## 2012-01-18 DIAGNOSIS — N179 Acute kidney failure, unspecified: Secondary | ICD-10-CM

## 2012-01-18 DIAGNOSIS — N1330 Unspecified hydronephrosis: Secondary | ICD-10-CM

## 2012-01-18 DIAGNOSIS — N200 Calculus of kidney: Secondary | ICD-10-CM

## 2012-01-18 DIAGNOSIS — R52 Pain, unspecified: Secondary | ICD-10-CM

## 2012-01-18 LAB — CBC
HCT: 29.7 % — ABNORMAL LOW (ref 36.0–46.0)
Hemoglobin: 10.2 g/dL — ABNORMAL LOW (ref 12.0–15.0)
MCH: 30.4 pg (ref 26.0–34.0)
MCV: 88.7 fL (ref 78.0–100.0)
Platelets: 230 10*3/uL (ref 150–400)
RBC: 3.35 MIL/uL — ABNORMAL LOW (ref 3.87–5.11)
WBC: 8.6 10*3/uL (ref 4.0–10.5)

## 2012-01-18 LAB — BASIC METABOLIC PANEL
CO2: 22 mEq/L (ref 19–32)
Calcium: 8.6 mg/dL (ref 8.4–10.5)
Chloride: 100 mEq/L (ref 96–112)
Glucose, Bld: 228 mg/dL — ABNORMAL HIGH (ref 70–99)
Sodium: 134 mEq/L — ABNORMAL LOW (ref 135–145)

## 2012-01-18 LAB — HEMOGLOBIN A1C
Hgb A1c MFr Bld: 9.2 % — ABNORMAL HIGH (ref <5.7)
Mean Plasma Glucose: 217 mg/dL — ABNORMAL HIGH (ref <117)

## 2012-01-18 LAB — GLUCOSE, CAPILLARY
Glucose-Capillary: 184 mg/dL — ABNORMAL HIGH (ref 70–99)
Glucose-Capillary: 146 mg/dL — ABNORMAL HIGH (ref 70–99)
Glucose-Capillary: 263 mg/dL — ABNORMAL HIGH (ref 70–99)

## 2012-01-18 SURGERY — CYSTOSCOPY, WITH STENT INSERTION
Anesthesia: General | Laterality: Right

## 2012-01-18 MED ORDER — HYDROMORPHONE HCL PF 1 MG/ML IJ SOLN
1.0000 mg | INTRAMUSCULAR | Status: DC | PRN
  Administered 2012-01-18 (×3): 1 mg via INTRAVENOUS
  Filled 2012-01-18 (×3): qty 1

## 2012-01-18 MED ORDER — PNEUMOCOCCAL VAC POLYVALENT 25 MCG/0.5ML IJ INJ
0.5000 mL | INJECTION | INTRAMUSCULAR | Status: AC
  Administered 2012-01-19: 0.5 mL via INTRAMUSCULAR
  Filled 2012-01-18: qty 0.5

## 2012-01-18 SURGICAL SUPPLY — 11 items
ADAPTER CATH URET PLST 4-6FR (CATHETERS) IMPLANT
BAG URO CATCHER STRL LF (DRAPE) IMPLANT
CATH INTERMIT  6FR 70CM (CATHETERS) IMPLANT
CLOTH BEACON ORANGE TIMEOUT ST (SAFETY) IMPLANT
DRAPE CAMERA CLOSED 9X96 (DRAPES) IMPLANT
GLOVE BIOGEL M STRL SZ7.5 (GLOVE) IMPLANT
GOWN STRL NON-REIN LRG LVL3 (GOWN DISPOSABLE) IMPLANT
GUIDEWIRE STR DUAL SENSOR (WIRE) IMPLANT
MANIFOLD NEPTUNE II (INSTRUMENTS) IMPLANT
PACK CYSTO (CUSTOM PROCEDURE TRAY) IMPLANT
TUBING CONNECTING 10 (TUBING) IMPLANT

## 2012-01-18 NOTE — Progress Notes (Signed)
Called by nurses that patient passed a lot of stones Kept stones to review Little to no pain Reviewed CT scan and compared stone burden; lead stone was crowning Voided large amount Last Cr was 5.35 Assessment: patient has likely passed stones; i think it would be best to cancel surgery and repeat labs in am; as long as Cr starts to decline we would follow with sequential labs and a KUB OK to feed tonight but keep NPO midnight just in case Cr does not start to decline OK with pt

## 2012-01-18 NOTE — Consult Note (Signed)
Subjective: Patient reports that she has less flank pain today. She is currently not experiencing any nausea. She still has some degree of pain in the right side.  Objective: Vital signs in last 24 hours: Temp:  [98.1 F (36.7 C)-98.8 F (37.1 C)] 98.1 F (36.7 C) (06/05 0506) Pulse Rate:  [74-80] 74  (06/05 0506) Resp:  [18-20] 18  (06/05 0506) BP: (108-137)/(73-75) 108/73 mmHg (06/05 0506) SpO2:  [95 %-99 %] 95 % (06/05 0506) Weight:  [124.6 kg (274 lb 11.1 oz)] 124.6 kg (274 lb 11.1 oz) (06/04 2201)A  Intake/Output from previous day: 06/04 0701 - 06/05 0700 In: 120 [P.O.:120] Out: -  Intake/Output this shift: Total I/O In: 0  Out: 100 [Urine:100]  Past Medical History  Diagnosis Date  . Diabetes mellitus   . Hypertension   . Hypothyroidism   . Cataract     Left eye( Dr. Dione Booze, patient uninsured so she can't  get  an intervention)  . Chronic kidney disease     nephrolithiasis, s/p removal 12/05/07  . Back pain     myofascial, excacerberated in 6-7/09, 9/09- required narcotics at those times  . Elbow fracture, right   . Obesity   . Depression     better on nortriptyline  . Hyperplastic colon polyp 2001    f/u colonoscopy 11/23/06 was normal( Dr. Leone Payor), falls into normal risk screening( next colonoscopy due in 2018)  . Pap smear for cervical cancer screening     12/14/2005: normal, evidence of candida. 05/16/2007: normal, benign reperative changes.    Physical Exam:  General: She is alert and awake and appears to be in no acute distress. Lungs - Normal respiratory effort, chest expands symmetrically.  Abdomen - Soft, non-tender & non-distended.  Lab Results:  [facility] 01/18/12 0438 01/17/12 1858  WBC 8.6 11.2*  HGB 10.2* 11.5*  HCT 29.7* 33.3*   BMET  Basename 01/18/12 0438 01/17/12 1858  NA 134* 131*  K 3.6 4.2  CL 100 96  CO2 22 22  GLUCOSE 228* 398*  BUN 62* 61*  CREATININE 5.35* 4.98*  CALCIUM 8.6 9.2   No results found for this basename:  LABURIN:1 in the last 72 hours Results for orders placed during the hospital encounter of 04/17/09  URINE CULTURE     Status: Normal   Collection Time   04/17/09 12:20 PM      Component Value Range Status Comment   Specimen Description URINE, CLEAN CATCH   Final    Special Requests ADDED [id number]   Final    Colony Count 70,000 COLONIES/ML   Final    Culture     Final    Value: Multiple bacterial morphotypes present, none predominant. Suggest appropriate recollection if clinically indicated.   Report Status 04/19/2009 FINAL   Final     Studies/Results: @[id number] @  Assessment/Plan After having reviewed her CT scan it would appear she has at least one stone located in the area of the intramural ureter or possibly even about ready to pass into the bladder through the ureteral orifice on the right-hand side as well as would appear also to be other stones proximal to this but all in the distal ureter. There is associated right hydronephrosis. In addition she has some degree of left renal atrophy. I suspect the combination of her obstructed right kidney as well as her left renal atrophy and some dehydration has resulted in her acute worsening of her renal function. She clearly needs to have her right kidney unobstructed.  I therefore have discussed with her the options. A percutaneous nephrostomy tube could be placed however the patient is able to undergo a general anesthetic with little risk and therefore I have recommended proceeding with a right double-J stent. We discussed the fact that she appears to have a stone in the intramural ureter which could potentially be removed at the same time and may need to be removed in order to allow passage of a guidewire and then stent however that will need to be determined at the time of her surgery. I have recommended a stent and then once her renal function returns to normal the possible steps would include either removal of the stent if the remaining stones  are small enough and allow spontaneous passage versus proceeding with either lithotripsy or ureteroscopic treatment of her stones. The stent would allow drainage of her right kidney and should also result in normalization of her creatinine. I have gone over this procedure with her in detail and given her information about ureteral stents. I answered all of the questions and he indicated that the probability of success with this procedure is likely greater than 90% with a slight possibility of needing a nephrostomy tube placed if access could not be gained in a retrograde manner.   She is scheduled for cystoscopy with right double-J stent placement under anesthesia this afternoon. I have spoken with my partner Dr. Sherron Monday who has agreed to perform her stent placement today.   Mieka Leaton C 01/18/2012, 12:14 PM

## 2012-01-18 NOTE — Care Management Note (Unsigned)
    Page 1 of 1   01/18/2012     1:00:05 PM   CARE MANAGEMENT NOTE 01/18/2012  Patient:  Tami Elliott, Tami Elliott   Account Number:  [id number]  Date Initiated:  01/18/2012  Documentation initiated by:  [facility]  Subjective/Objective Assessment:   ADMITTED W/R FLANK PAIN.ARF.     Action/Plan:   FROM HOME   Anticipated DC Date:  01/23/2012   Anticipated DC Plan:  HOME/SELF CARE      DC Planning Services  CM consult      Choice offered to / List presented to:             Status of service:  In process, will continue to follow Medicare Important Message given?   (If response is "NO", the following Medicare IM given date fields will be blank) Date Medicare IM given:   Date Additional Medicare IM given:    Discharge Disposition:    Per UR Regulation:  Reviewed for med. necessity/level of care/duration of stay  If discussed at Long Length of Stay Meetings, dates discussed:    Comments:  01/18/12 Paradise Vensel RN,BSN NCM [phone] URO FOLLOWING.FOR CYSTOSCOPY,?R DBL J STENT.

## 2012-01-18 NOTE — Progress Notes (Signed)
I have directly reviewed the clinical findings, lab, imaging studies and management of this patient in detail. I have interviewed and examined the patient and agree with the documentation,  as recorded by the Physician extender.  Going for Urteric stent by Urology, renal called, too, making Urine, IVF, monitor BMP, no nausea-SOB, K stable, good apetite.   Leroy Sea M.D on 01/18/2012 at 9:56 AM  [facility] Office  [phone]

## 2012-01-18 NOTE — Progress Notes (Signed)
Patient urinated 750cc in hat. Multiple stones were seen, MD notified. MD is coming to evaluate patient for surgery.  Will continue to monitor patient

## 2012-01-18 NOTE — Progress Notes (Signed)
Subjective: Sitting up in bed about to order breakfast. Reports pain improved. Instructed to not eat/drink.  Objective: Vital signs Filed Vitals:   01/17/12 1736 01/17/12 2201 01/18/12 0506  BP: 137/73 112/75 108/73  Pulse: 80 74 74  Temp: 98.8 F (37.1 C) 98.2 F (36.8 C) 98.1 F (36.7 C)  TempSrc: Oral Oral Oral  Resp: 20 20 18   Height:  5\' 4"  (1.626 m)   Weight:  124.6 kg (274 lb 11.1 oz)   SpO2: 96% 99% 95%   Weight change:  Last BM Date: 01/16/12  Intake/Output from previous day: 06/04 0701 - 06/05 0700 In: 120 [P.O.:120] Out: -  Total I/O In: -  Out: 100 [Urine:100]   Physical Exam: General: Alert, awake, oriented x3, in no acute distress. HEENT: No bruits, no goiter. PERRL Mucus membranes moist/pink.  Heart: Regular rate and rhythm, without murmurs, rubs, gallops. No LEE Lungs:Normal effort. Breath sounds clear to auscultation bilaterally. No wheeze Abdomen:Obese,  Soft, nontender, nondistended, positive bowel sounds. Extremities: No clubbing cyanosis or edema with positive pedal pulses. Neuro: Grossly intact, nonfocal. Cranial nerve II-XII intact.     Lab Results: Basic Metabolic Panel:  Basename 01/18/12 0438 01/17/12 1858  NA 134* 131*  K 3.6 4.2  CL 100 96  CO2 22 22  GLUCOSE 228* 398*  BUN 62* 61*  CREATININE 5.35* 4.98*  CALCIUM 8.6 9.2  MG -- --  PHOS -- --   Liver Function Tests:  Basename 01/17/12 1858  AST 10  ALT 15  ALKPHOS 99  BILITOT 0.2*  PROT 7.4  ALBUMIN 3.4*   No results found for this basename: LIPASE:2,AMYLASE:2 in the last 72 hours No results found for this basename: AMMONIA:2 in the last 72 hours CBC:  Basename 01/18/12 0438 01/17/12 1858  WBC 8.6 11.2*  NEUTROABS -- --  HGB 10.2* 11.5*  HCT 29.7* 33.3*  MCV 88.7 87.4  PLT 230 260   Cardiac Enzymes: No results found for this basename: CKTOTAL:3,CKMB:3,CKMBINDEX:3,TROPONINI:3 in the last 72 hours BNP: No results found for this basename: PROBNP:3 in the last  72 hours D-Dimer: No results found for this basename: DDIMER:2 in the last 72 hours CBG:  Basename 01/18/12 0735 01/17/12 2252  GLUCAP 184* 235*   Hemoglobin A1C: No results found for this basename: HGBA1C in the last 72 hours Fasting Lipid Panel: No results found for this basename: CHOL,HDL,LDLCALC,TRIG,CHOLHDL,LDLDIRECT in the last 72 hours Thyroid Function Tests: No results found for this basename: TSH,T4TOTAL,FREET4,T3FREE,THYROIDAB in the last 72 hours Anemia Panel: No results found for this basename: VITAMINB12,FOLATE,FERRITIN,TIBC,IRON,RETICCTPCT in the last 72 hours Coagulation: No results found for this basename: LABPROT:2,INR:2 in the last 72 hours Urine Drug Screen: Drugs of Abuse  No results found for this basename: labopia, cocainscrnur, labbenz, amphetmu, thcu, labbarb    Alcohol Level: No results found for this basename: ETH:2 in the last 72 hours Urinalysis:  Basename 01/17/12 1744  COLORURINE YELLOW  LABSPEC 1.020  PHURINE 5.5  GLUCOSEU 500*  HGBUR LARGE*  BILIRUBINUR NEGATIVE  KETONESUR NEGATIVE  PROTEINUR 30*  UROBILINOGEN 0.2  NITRITE NEGATIVE  LEUKOCYTESUR SMALL*   Misc. Labs:  No results found for this or any previous visit (from the past 240 hour(s)).  Studies/Results: Ct Abdomen Pelvis Wo Contrast  01/17/2012  *RADIOLOGY REPORT*  Clinical Data: Right flank pain.  CT ABDOMEN AND PELVIS WITHOUT CONTRAST  Technique:  Multidetector CT imaging of the abdomen and pelvis was performed following the standard protocol without intravenous contrast.  Comparison: 04/24/2009  Findings: Stable  punctate nodule at the left lung base.  No evidence for free air. Unenhanced CT was performed per clinician order.  Lack of IV contrast limits sensitivity and specificity, especially for evaluation of abdominal/pelvic solid viscera.  There is moderate to severe right hydroureteronephrosis.  There is stranding around the mid right ureter.  There are multiple stones in the  distal right ureter that extend to the right ureterovesical junction.  The most distal stone measures up to 0.6 cm. There is stranding and edema around the right kidney.  There are additional small stones within the right kidney and there are multiple stones in the left kidney.  Index left kidney stone roughly measures 0.5 cm.  There is no left hydronephrosis.  No gross abnormality to the liver, gallbladder, spleen, pancreas or adrenal glands.  Again noted are small retroperitoneal lymph nodes which have not significantly changed.  No gross abnormality to the uterus or adnexa tissue.  No acute bony abnormality.  IMPRESSION: Moderate to severe right hydroureteronephrosis due to multiple stones in the distal right ureter.  The stones extend to the right ureterovesical junction.  Bilateral renal calculi.  Original Report Authenticated By: Richarda Overlie, M.D.    Medications: Scheduled Meds:   . sodium chloride   Intravenous Once  . cefTRIAXone (ROCEPHIN)  IV  1 g Intravenous Q24H  . cefTRIAXone (ROCEPHIN) IVPB 1 gram/50 mL D5W      . DULoxetine  30 mg Oral Daily  . DULoxetine  60 mg Oral QHS  . metoprolol tartrate  50 mg Oral Daily   And  . hydrochlorothiazide  25 mg Oral Daily  .  HYDROmorphone (DILAUDID) injection  1 mg Intravenous Once  . insulin aspart  0-9 Units Subcutaneous TID WC  . insulin glargine  70 Units Subcutaneous QHS  . ketorolac  30 mg Intravenous Once  . latanoprost  1 drop Both Eyes QHS  . levothyroxine  150 mcg Oral QAC breakfast  . ondansetron (ZOFRAN) IV  4 mg Intravenous Once  . DISCONTD: metoprolol-hydrochlorothiazide  1 tablet Oral Daily   Continuous Infusions:   . sodium chloride 100 mL/hr at 01/17/12 2316   PRN Meds:.HYDROcodone-acetaminophen, ondansetron (ZOFRAN) IV, ondansetron  Assessment/Plan:  Principal Problem:  *Acute on chronic  renal failure: secondary to severe right hydroureteronephrosis due to multiple stones per CT. Creatinine worse today. Pt with hx  CKD. Scheduled for stents this afternoon. Would expect improvement once post procedure. Discussed with nephrology who agreed with plan to continue IV fluids and call tomorrow if no improvement post procedure. Pt.  NPO in anticipation of procedure, will hold nephrotoxins. Pt hx CKD baseline 1.5-1.9. Will monitor closely.   Active Problems:  DIABETES MELLITUS, TYPE II. CBG range 184-235. Will check A1c. Continue SSI and lantus.    HYPERTENSION: Fair control. SBP range 137-108. Continue lopressor. Hold HCTZ secondary to #1.    Hydronephrosis: See treatment problem #1. Rocephin day #2. Urine culture pending.    Nephrolithiasis: urology following. Pt NPO for stent placement this afternoon. Pain management with IV dilaudid.   Hypothyroidism: check TSH. Continue synthroid.    LOS: 1 day   [facility] M 01/18/2012, 8:39 AM

## 2012-01-19 ENCOUNTER — Inpatient Hospital Stay (HOSPITAL_COMMUNITY): Payer: Medicare Other

## 2012-01-19 DIAGNOSIS — N179 Acute kidney failure, unspecified: Secondary | ICD-10-CM

## 2012-01-19 DIAGNOSIS — R52 Pain, unspecified: Secondary | ICD-10-CM

## 2012-01-19 DIAGNOSIS — N200 Calculus of kidney: Secondary | ICD-10-CM

## 2012-01-19 DIAGNOSIS — N1330 Unspecified hydronephrosis: Secondary | ICD-10-CM

## 2012-01-19 LAB — CBC
Platelets: 274 10*3/uL (ref 150–400)
RBC: 3.51 MIL/uL — ABNORMAL LOW (ref 3.87–5.11)
RDW: 14.6 % (ref 11.5–15.5)
WBC: 7.3 10*3/uL (ref 4.0–10.5)

## 2012-01-19 LAB — BASIC METABOLIC PANEL
CO2: 24 mEq/L (ref 19–32)
Chloride: 104 mEq/L (ref 96–112)
GFR calc Af Amer: 12 mL/min — ABNORMAL LOW (ref >90)
Sodium: 137 mEq/L (ref 135–145)

## 2012-01-19 LAB — URINE CULTURE

## 2012-01-19 LAB — GLUCOSE, CAPILLARY: Glucose-Capillary: 131 mg/dL — ABNORMAL HIGH (ref 70–99)

## 2012-01-19 MED ORDER — SODIUM CHLORIDE 0.9 % IV SOLN: INTRAVENOUS | Status: DC

## 2012-01-19 MED ORDER — DEXTROSE-NACL 5-0.45 % IV SOLN: INTRAVENOUS | Status: DC

## 2012-01-19 MED ORDER — DEXTROSE 5 % AND 0.45 % NACL IV BOLUS: 75.0000 mL | INTRAVENOUS | Status: DC

## 2012-01-19 MED ORDER — TAMSULOSIN HCL 0.4 MG PO CAPS
0.4000 mg | ORAL_CAPSULE | Freq: Every day | ORAL | Status: DC
  Administered 2012-01-19: 0.4 mg via ORAL
  Filled 2012-01-19 (×2): qty 1

## 2012-01-19 NOTE — Progress Notes (Signed)
I have directly reviewed the clinical findings, lab, imaging studies and management of this patient in detail. I have interviewed and examined the patient and agree with the documentation,  as recorded by the Physician extender.  Likely passed a few stones, per Uro ok for diet, IVF, creat improving. Close CBG monitoring.   Leroy Sea M.D on 01/19/2012 at 12:31 PM  [facility] Office  [phone]

## 2012-01-19 NOTE — Consult Note (Signed)
Subjective: Patient reports currently having no pain. She said she did have some slight discomfort in the right flank region about midnight . She also said she might have passed another small stone although she indicates that she was not told of any further stone passage. No new voiding complaints are noted.  Objective: Vital signs in last 24 hours: Temp:  [97.4 F (36.3 C)-98.1 F (36.7 C)] 98.1 F (36.7 C) (06/06 0512) Pulse Rate:  [65-81] 67  (06/06 0512) Resp:  [16-18] 18  (06/06 0512) BP: (120-133)/(76-84) 133/79 mmHg (06/06 0512) SpO2:  [93 %-97 %] 97 % (06/06 0512)  Intake/Output from previous day: 06/05 0701 - 06/06 0700 In: 52 [IV Piggyback:52] Out: 1620 [Urine:1550; Drains:70] Intake/Output this shift:    Physical Exam:  Abdomen is obese but soft and nontender. No CVAT  Lab Results:  Basename 01/19/12 0456 01/18/12 0438 01/17/12 1858  HGB 10.5* 10.2* 11.5*  HCT 31.0* 29.7* 33.3*   BMET  Basename 01/19/12 0456 01/18/12 0438  NA 137 134*  K 4.4 3.6  CL 104 100  CO2 24 22  GLUCOSE 153* 228*  BUN 60* 62*  CREATININE 4.25* 5.35*  CALCIUM 8.9 8.6   No results found for this basename: LABPT:3,INR:3 in the last 72 hours No results found for this basename: LABURIN:1 in the last 72 hours Results for orders placed during the hospital encounter of 01/17/12  URINE CULTURE     Status: Normal   Collection Time   01/17/12  5:44 PM      Component Value Range Status Comment   Specimen Description URINE, RANDOM   Final    Special Requests NONE   Final    Culture  Setup Time [id number]   Final    Colony Count >=100,000 COLONIES/ML   Final    Culture     Final    Value: Multiple bacterial morphotypes present, none predominant. Suggest appropriate recollection if clinically indicated.   Report Status 01/19/2012 FINAL   Final   SURGICAL PCR SCREEN     Status: Normal   Collection Time   01/18/12  1:08 PM      Component Value Range Status Comment   MRSA, PCR NEGATIVE   NEGATIVE  Final    Staphylococcus aureus NEGATIVE  NEGATIVE  Final     Studies/Results: Ct Abdomen Pelvis Wo Contrast  01/17/2012  *RADIOLOGY REPORT*  Clinical Data: Right flank pain.  CT ABDOMEN AND PELVIS WITHOUT CONTRAST  Technique:  Multidetector CT imaging of the abdomen and pelvis was performed following the standard protocol without intravenous contrast.  Comparison: 04/24/2009  Findings: Stable punctate nodule at the left lung base.  No evidence for free air. Unenhanced CT was performed per clinician order.  Lack of IV contrast limits sensitivity and specificity, especially for evaluation of abdominal/pelvic solid viscera.  There is moderate to severe right hydroureteronephrosis.  There is stranding around the mid right ureter.  There are multiple stones in the distal right ureter that extend to the right ureterovesical junction.  The most distal stone measures up to 0.6 cm. There is stranding and edema around the right kidney.  There are additional small stones within the right kidney and there are multiple stones in the left kidney.  Index left kidney stone roughly measures 0.5 cm.  There is no left hydronephrosis.  No gross abnormality to the liver, gallbladder, spleen, pancreas or adrenal glands.  Again noted are small retroperitoneal lymph nodes which have not significantly changed.  No gross abnormality to  the uterus or adnexa tissue.  No acute bony abnormality.  IMPRESSION: Moderate to severe right hydroureteronephrosis due to multiple stones in the distal right ureter.  The stones extend to the right ureterovesical junction.  Bilateral renal calculi.  Original Report Authenticated By: Richarda Overlie, M.D.    Assessment/Plan: Her CT scan appeared to show the largest stone was in fact the most distal stone located at the ureteral orifice that appears to have passed and then allowed further stones to pass as well. Dr. Sherron Monday felt that she likely had passed the majority of her stones if not all  of them and elected not to proceed with stent placement last night. The patient is feeling well currently and her creatinine has decreased by >1 from its previous value yesterday. This would also be consistent with the passage of the obstructing ureteral calculi. A KUB has been ordered in order to evaluate the distal ureteral region for any remaining calculi.  KUB this morning.  Serial creatinines.  Will maintain n.p.o. until after I see her KUB this morning and then will likely restart her diet.   LOS: 2 days   Tierany Appleby C 01/19/2012, 7:11 AM

## 2012-01-19 NOTE — Progress Notes (Signed)
Subjective: Awake, alert, denies pain/discomfort. Passed several stones in urine last evening. NAD  Objective: Vital signs Filed Vitals:   01/18/12 1625 01/18/12 1927 01/18/12 2115 01/19/12 0512  BP: 130/83 129/84 120/76 133/79  Pulse: 72 81 65 67  Temp: 97.4 F (36.3 C)  97.6 F (36.4 C) 98.1 F (36.7 C)  TempSrc: Oral  Oral Oral  Resp: 16  16 18   Height:      Weight:      SpO2: 97%  93% 97%   Weight change:  Last BM Date: 01/16/12  Intake/Output from previous day: 06/05 0701 - 06/06 0700 In: 52 [IV Piggyback:52] Out: 1620 [Urine:1550; Drains:70]     Physical Exam: General: Alert, awake, oriented x3, in no acute distress. HEENT: No bruits, no goiter. PERRL, mucus membranes moist/pink.  Heart: Regular rate and rhythm, without murmurs, rubs, gallops. Lungs:Normal effort. Breath sounds clear to auscultation bilaterally. No wheeze Abdomen:  Obese, Soft, nontender, nondistended, positive bowel sounds. Extremities: No clubbing cyanosis or edema with positive pedal pulses. Neuro: Grossly intact, nonfocal. Cranial nerve II-XII intact    Lab Results: Basic Metabolic Panel:  Basename 01/19/12 0456 01/18/12 0438  NA 137 134*  K 4.4 3.6  CL 104 100  CO2 24 22  GLUCOSE 153* 228*  BUN 60* 62*  CREATININE 4.25* 5.35*  CALCIUM 8.9 8.6  MG -- --  PHOS -- --   Liver Function Tests:  Basename 01/17/12 1858  AST 10  ALT 15  ALKPHOS 99  BILITOT 0.2*  PROT 7.4  ALBUMIN 3.4*   No results found for this basename: LIPASE:2,AMYLASE:2 in the last 72 hours No results found for this basename: AMMONIA:2 in the last 72 hours CBC:  Basename 01/19/12 0456 01/18/12 0438  WBC 7.3 8.6  NEUTROABS -- --  HGB 10.5* 10.2*  HCT 31.0* 29.7*  MCV 88.3 88.7  PLT 274 230   Cardiac Enzymes: No results found for this basename: CKTOTAL:3,CKMB:3,CKMBINDEX:3,TROPONINI:3 in the last 72 hours BNP: No results found for this basename: PROBNP:3 in the last 72 hours D-Dimer: No results  found for this basename: DDIMER:2 in the last 72 hours CBG:  Basename 01/19/12 0741 01/18/12 2113 01/18/12 1619 01/18/12 1141 01/18/12 0735 01/17/12 2252  GLUCAP 131* 263* 146* 163* 184* 235*   Hemoglobin A1C:  Basename 01/18/12 0949  HGBA1C 9.2*   Fasting Lipid Panel: No results found for this basename: CHOL,HDL,LDLCALC,TRIG,CHOLHDL,LDLDIRECT in the last 72 hours Thyroid Function Tests:  Basename 01/18/12 0949  TSH 1.843  T4TOTAL --  FREET4 --  T3FREE --  THYROIDAB --   Anemia Panel: No results found for this basename: VITAMINB12,FOLATE,FERRITIN,TIBC,IRON,RETICCTPCT in the last 72 hours Coagulation: No results found for this basename: LABPROT:2,INR:2 in the last 72 hours Urine Drug Screen: Drugs of Abuse  No results found for this basename: labopia, cocainscrnur, labbenz, amphetmu, thcu, labbarb    Alcohol Level: No results found for this basename: ETH:2 in the last 72 hours Urinalysis:  Basename 01/17/12 1744  COLORURINE YELLOW  LABSPEC 1.020  PHURINE 5.5  GLUCOSEU 500*  HGBUR LARGE*  BILIRUBINUR NEGATIVE  KETONESUR NEGATIVE  PROTEINUR 30*  UROBILINOGEN 0.2  NITRITE NEGATIVE  LEUKOCYTESUR SMALL*   Misc. Labs:  Recent Results (from the past 240 hour(s))  URINE CULTURE     Status: Normal   Collection Time   01/17/12  5:44 PM      Component Value Range Status Comment   Specimen Description URINE, RANDOM   Final    Special Requests NONE   Final  Culture  Setup Time [id number]   Final    Colony Count >=100,000 COLONIES/ML   Final    Culture     Final    Value: Multiple bacterial morphotypes present, none predominant. Suggest appropriate recollection if clinically indicated.   Report Status 01/19/2012 FINAL   Final   SURGICAL PCR SCREEN     Status: Normal   Collection Time   01/18/12  1:08 PM      Component Value Range Status Comment   MRSA, PCR NEGATIVE  NEGATIVE  Final    Staphylococcus aureus NEGATIVE  NEGATIVE  Final     Studies/Results: Ct  Abdomen Pelvis Wo Contrast  01/17/2012  *RADIOLOGY REPORT*  Clinical Data: Right flank pain.  CT ABDOMEN AND PELVIS WITHOUT CONTRAST  Technique:  Multidetector CT imaging of the abdomen and pelvis was performed following the standard protocol without intravenous contrast.  Comparison: 04/24/2009  Findings: Stable punctate nodule at the left lung base.  No evidence for free air. Unenhanced CT was performed per clinician order.  Lack of IV contrast limits sensitivity and specificity, especially for evaluation of abdominal/pelvic solid viscera.  There is moderate to severe right hydroureteronephrosis.  There is stranding around the mid right ureter.  There are multiple stones in the distal right ureter that extend to the right ureterovesical junction.  The most distal stone measures up to 0.6 cm. There is stranding and edema around the right kidney.  There are additional small stones within the right kidney and there are multiple stones in the left kidney.  Index left kidney stone roughly measures 0.5 cm.  There is no left hydronephrosis.  No gross abnormality to the liver, gallbladder, spleen, pancreas or adrenal glands.  Again noted are small retroperitoneal lymph nodes which have not significantly changed.  No gross abnormality to the uterus or adnexa tissue.  No acute bony abnormality.  IMPRESSION: Moderate to severe right hydroureteronephrosis due to multiple stones in the distal right ureter.  The stones extend to the right ureterovesical junction.  Bilateral renal calculi.  Original Report Authenticated By: Richarda Overlie, M.D.    Medications: Scheduled Meds:   . cefTRIAXone (ROCEPHIN)  IV  1 g Intravenous Q24H  . DULoxetine  30 mg Oral Daily  . DULoxetine  60 mg Oral QHS  . insulin aspart  0-9 Units Subcutaneous TID WC  . insulin glargine  70 Units Subcutaneous QHS  . latanoprost  1 drop Both Eyes QHS  . levothyroxine  150 mcg Oral QAC breakfast  . metoprolol tartrate  50 mg Oral Daily  . pneumococcal  23 valent vaccine  0.5 mL Intramuscular Tomorrow-1000  . DISCONTD: hydrochlorothiazide  25 mg Oral Daily   Continuous Infusions:   . sodium chloride 100 mL/hr at 01/17/12 2316   PRN Meds:.HYDROcodone-acetaminophen, HYDROmorphone (DILAUDID) injection, ondansetron (ZOFRAN) IV, ondansetron  Assessment/Plan:  Principal Problem:  *Acute renal failure Active Problems:  DIABETES MELLITUS, TYPE II  HYPERTENSION  Hydronephrosis  Nephrolithiasis *Acute on chronic renal failure: secondary to severe right hydroureteronephrosis due to multiple stones per CT. Creatinine trending down. Pt with hx CKD. Pt passed stones last evening so procedure cancelled. Urology following. Plan to repeat KUB this am. NPO since midnight  In case surgery today. Will continue IV fluid.   Active Problems:  DIABETES MELLITUS, TYPE II. CBG range 184-235. A1c 9.2. Continue SSI and lantus.  HYPERTENSION: Fair control. SBP range 137-108. Continue lopressor. Hold HCTZ secondary to #1.  Hydronephrosis: See treatment problem #1. Rocephin day #3. Urine culture  without predominant organism.  Nephrolithiasis: urology following.  Pain management with IV dilaudid. Pt passed stones last evening.  Hypothyroidism:  TSH 1.8. Continue synthroid    LOS: 2 days   [facility] M 01/19/2012, 8:39 AM

## 2012-01-19 NOTE — Progress Notes (Signed)
Inpatient Diabetes Program Recommendations  AACE/ADA: New Consensus Statement on Inpatient Glycemic Control (2009)  Target Ranges:  Prepandial:   less than 140 mg/dL      Peak postprandial:   less than 180 mg/dL (1-2 hours)      Critically ill patients:  140 - 180 mg/dL   Reason for Visit: Note A1C=9.2%.  CBG's improved today. No recommendations at this time.  Will need to follow-up with PCP regarding elevated A1C. Results for KAMYA, WATLING (MRN [id number]) as of 01/19/2012 15:28  Ref. Range 01/18/2012 11:41 01/18/2012 16:19 01/18/2012 21:13 01/19/2012 07:41 01/19/2012 11:48  Glucose-Capillary Latest Range: 70-99 mg/dL 086 (H) 578 (H) 469 (H) 131 (H) 124 (H)

## 2012-01-20 DIAGNOSIS — N179 Acute kidney failure, unspecified: Secondary | ICD-10-CM

## 2012-01-20 DIAGNOSIS — N1330 Unspecified hydronephrosis: Secondary | ICD-10-CM

## 2012-01-20 DIAGNOSIS — N200 Calculus of kidney: Secondary | ICD-10-CM

## 2012-01-20 DIAGNOSIS — R52 Pain, unspecified: Secondary | ICD-10-CM

## 2012-01-20 LAB — BASIC METABOLIC PANEL
BUN: 49 mg/dL — ABNORMAL HIGH (ref 6–23)
Creatinine, Ser: 2.81 mg/dL — ABNORMAL HIGH (ref 0.50–1.10)
GFR calc Af Amer: 20 mL/min — ABNORMAL LOW (ref >90)
GFR calc non Af Amer: 17 mL/min — ABNORMAL LOW (ref >90)

## 2012-01-20 LAB — CBC
HCT: 31.3 % — ABNORMAL LOW (ref 36.0–46.0)
MCHC: 33.5 g/dL (ref 30.0–36.0)
MCV: 88.9 fL (ref 78.0–100.0)
RDW: 14.4 % (ref 11.5–15.5)

## 2012-01-20 MED ORDER — CIPROFLOXACIN HCL 500 MG PO TABS
500.0000 mg | ORAL_TABLET | Freq: Two times a day (BID) | ORAL | Status: DC
  Filled 2012-01-20 (×3): qty 1

## 2012-01-20 MED ORDER — HYDROCODONE-ACETAMINOPHEN 5-325 MG PO TABS: 1.0000 | ORAL_TABLET | ORAL | Status: AC | PRN

## 2012-01-20 MED ORDER — METOPROLOL TARTRATE 50 MG PO TABS: 50.0000 mg | ORAL_TABLET | Freq: Every day | ORAL | Status: DC

## 2012-01-20 MED ORDER — CIPROFLOXACIN HCL 500 MG PO TABS: 500.0000 mg | ORAL_TABLET | Freq: Two times a day (BID) | ORAL | Status: AC

## 2012-01-20 NOTE — Consult Note (Signed)
2 Days Post-Op Subjective: Patient reports having passed further stones last night and again this morning. She is not having any flank pain or irritative voiding symptoms,  Objective: Vital signs in last 24 hours: Temp:  [97.6 F (36.4 C)-98.3 F (36.8 C)] 97.6 F (36.4 C) (06/07 0549) Pulse Rate:  [66-74] 74  (06/07 0549) Resp:  [16-20] 20  (06/07 0549) BP: (132-144)/(81-91) 144/91 mmHg (06/07 0549) SpO2:  [93 %-95 %] 93 % (06/07 0549)  Intake/Output from previous day: 06/06 0701 - 06/07 0700 In: 2100 [P.O.:850; I.V.:1200; IV Piggyback:50] Out: 2000 [Urine:2000] Intake/Output this shift:    Physical Exam:  General: She is in no distress. Her abdomen is obese but soft and nontender. She has no CVAT.  Lab Results:  Basename 01/20/12 0420 01/19/12 0456 01/18/12 0438  HGB 10.5* 10.5* 10.2*  HCT 31.3* 31.0* 29.7*   BMET  Basename 01/20/12 0420 01/19/12 0456  NA 138 137  K 4.4 4.4  CL 104 104  CO2 24 24  GLUCOSE 248* 153*  BUN 49* 60*  CREATININE 2.81* 4.25*  CALCIUM 8.9 8.9   No results found for this basename: LABPT:3,INR:3 in the last 72 hours No results found for this basename: LABURIN:1 in the last 72 hours Results for orders placed during the hospital encounter of 01/17/12  URINE CULTURE     Status: Normal   Collection Time   01/17/12  5:44 PM      Component Value Range Status Comment   Specimen Description URINE, RANDOM   Final    Special Requests NONE   Final    Culture  Setup Time [id number]   Final    Colony Count >=100,000 COLONIES/ML   Final    Culture     Final    Value: Multiple bacterial morphotypes present, none predominant. Suggest appropriate recollection if clinically indicated.   Report Status 01/19/2012 FINAL   Final   SURGICAL PCR SCREEN     Status: Normal   Collection Time   01/18/12  1:08 PM      Component Value Range Status Comment   MRSA, PCR NEGATIVE  NEGATIVE  Final    Staphylococcus aureus NEGATIVE  NEGATIVE  Final      Studies/Results: Dg Abd 1 View  01/19/2012  *RADIOLOGY REPORT*  Clinical Data: 61 year old female with right-side renal stones. Reports several stones have passed since admission.  ABDOMEN - 1 VIEW  Comparison: CT abdomen and pelvis 01/17/2012.  Findings: Multiple distal right ureteral stones demonstrated on the comparison were only faintly radiopaque on the scout view of that study.  There does appear to be a residual oval or teardrop shaped stone projecting partially over the right bladder contour measuring up to 4 x 7 mm.  Nephrolithiasis seen on the comparison is not evident.  Grossly negative lung bases. Nonobstructed bowel gas pattern. No acute osseous abnormality identified.  IMPRESSION: One residual distal right ureteral stone is identified, likely just proximal to the right UVJ, and measuring up to 4 x 7 mm.  Original Report Authenticated By: Harley Hallmark, M.D.    Assessment/Plan: It would appear the single stone in her distal right ureter measuring 4 x 7 mm was in fact several stones overlying each other. She has passed multiple stones and save them. It would appear all the stones from her distal ureter had not passed and her creatinine has improved significantly. I told her at this point urologically further imaging would not be necessary as she is clearly passed all of  her distal right ureteral calculi with resulting in the improvement in her creatinine do to her right kidney being unobstructed now.  She may be discharged home today from a urologic standpoint.   LOS: 3 days   Destaney Sarkis C 01/20/2012, 7:22 AM

## 2012-01-20 NOTE — Discharge Instructions (Addendum)
Follow with Primary MD Jearld Lesch, MD, MD in 3 days   Get CBC, BMP, checked 3 days by Primary MD and again as instructed by your Primary MD.    Get Medicines reviewed and adjusted. Stop taking Mobic   Please request your Prim.MD to go over all Hospital Tests and Procedure/Radiological results at the follow up, please get all Hospital records sent to your Prim MD by signing hospital release before you go home.  Activity: As tolerated.  Diet: Heart Healthy-Low carb    Disposition Home  If you experience worsening of your admission symptoms, develop shortness of breath, life threatening emergency, suicidal or homicidal thoughts you must seek medical attention immediately by calling 911 or calling your MD immediately  if symptoms less severe.  You Must read complete instructions/literature along with all the possible adverse reactions/side effects for all the Medicines you take and that have been prescribed to you. Take any new Medicines after you have completely understood and accpet all the possible adverse reactions/side effects.    Do not drive when taking Pain medications.    Do not take more than prescribed Pain, Sleep and Anxiety Medications  Special Instructions: If you have smoked or chewed Tobacco  in the last 2 yrs please stop smoking, stop any regular Alcohol  and or any Recreational drug use.  Wear Seat belts while driving.

## 2012-01-20 NOTE — Progress Notes (Signed)
Inpatient Diabetes Program Recommendations  AACE/ADA: New Consensus Statement on Inpatient Glycemic Control (2009)  Target Ranges:  Prepandial:   less than 140 mg/dL      Peak postprandial:   less than 180 mg/dL (1-2 hours)      Critically ill patients:  140 - 180 mg/dL    Results for JAIDAH, LOMAX (MRN [id number]) as of 01/20/2012 10:15  Ref. Range 01/19/2012 07:41 01/19/2012 11:48 01/19/2012 16:23 01/19/2012 23:12  Glucose-Capillary Latest Range: 70-99 mg/dL 409 (H) 811 (H) 914 (H) 276 (H)   Patient eating 80-100% of meals.  Inpatient Diabetes Program Recommendations Insulin - Meal Coverage: Please consider adding low dose meal coverage if patient continues to have elevated postprandial CBGs- Novolog 3 units tid with meals  Note: Will follow. Ambrose Finland RN, MSN, CDE Diabetes Coordinator Inpatient Diabetes Program [phone]

## 2012-01-20 NOTE — Discharge Summary (Signed)
Physician Discharge Summary  Patient ID: Tami Elliott MRN: [id number] DOB/AGE: 60/25/1952 60 y.o.  Admit date: 01/17/2012 Discharge date: 01/20/2012  Primary Care Physician:  Jearld Lesch, MD, MD   Discharge Diagnoses:    Principal Problem:  *Acute renal failure Active Problems:  DIABETES MELLITUS, TYPE II  HYPERTENSION  Hydronephrosis  Nephrolithiasis   Medication List  As of 01/20/2012 10:26 AM   STOP taking these medications         metoprolol-hydrochlorothiazide 50-25 MG per tablet         TAKE these medications         aspirin 81 MG tablet   Take 81 mg by mouth daily.      ciprofloxacin 500 MG tablet   Commonly known as: CIPRO   Take 1 tablet (500 mg total) by mouth 2 (two) times daily.      DULoxetine 30 MG capsule   Commonly known as: CYMBALTA   Take 30 mg by mouth daily.      DULoxetine 60 MG capsule   Commonly known as: CYMBALTA   Take 60 mg by mouth every evening.      HYDROcodone-acetaminophen 5-325 MG per tablet   Commonly known as: NORCO   Take 1-2 tablets by mouth every 4 (four) hours as needed.      insulin glargine 100 UNIT/ML injection   Commonly known as: LANTUS   Inject 70 Units into the skin at bedtime.      levothyroxine 150 MCG tablet   Commonly known as: SYNTHROID, LEVOTHROID   Take 150 mcg by mouth daily.      meloxicam 15 MG tablet   Commonly known as: MOBIC   Take 15 mg by mouth daily.      metoprolol 50 MG tablet   Commonly known as: LOPRESSOR   Take 1 tablet (50 mg total) by mouth daily.      polyvinyl alcohol 1.4 % ophthalmic solution   Commonly known as: LIQUIFILM TEARS   Place 1 drop into both eyes as needed. For dryness      VICTOZA 18 MG/3ML Soln   Generic drug: Liraglutide   Inject 1.2 mg into the skin daily.      XALATAN 0.005 % ophthalmic solution   Generic drug: latanoprost   Place 1 drop into both eyes at bedtime.             Disposition and Follow-up: Pt medically stable and ready for  discharge to home.      Follow-up Information    Follow up with Jearld Lesch, MD. Schedule an appointment as soon as possible for a visit in 3 days.   Contact information:   [address]  [place] [address] [phone]       Follow up with Garnett Farm, MD. Schedule an appointment as soon as possible for a visit in 5 days.   Contact information:   [address] [place] [address] [phone]          Consults:  Urology  Physical Exam: General: Alert, awake, oriented x3, in no acute distress.  HEENT: No bruits, no goiter. PERRL, mucus membranes moist/pink.  Heart: Regular rate and rhythm, without murmurs, rubs, gallops.  Lungs:Normal effort. Breath sounds clear to auscultation bilaterally. No wheeze  Abdomen: Obese, Soft, nontender, nondistended, positive bowel sounds.  Extremities: No clubbing cyanosis or edema with positive pedal pulses.  Neuro: Grossly intact, nonfocal. Cranial nerve II-XII intact      Significant Diagnostic  Studies:    Labs Reviewed  URINALYSIS, ROUTINE W REFLEX MICROSCOPIC - Abnormal; Notable for the following:    APPearance CLOUDY (*)    Glucose, UA 500 (*)    Hgb urine dipstick LARGE (*)    Protein, ur 30 (*)    Leukocytes, UA SMALL (*)    All other components within normal limits  COMPREHENSIVE METABOLIC PANEL - Abnormal; Notable for the following:    Sodium 131 (*)    Glucose, Bld 398 (*)    BUN 61 (*)    Creatinine, Ser 4.98 (*)    Albumin 3.4 (*)    Total Bilirubin 0.2 (*)    GFR calc non Af Amer 9 (*)    GFR calc Af Amer 10 (*)    All other components within normal limits  CBC - Abnormal; Notable for the following:    WBC 11.2 (*)    RBC 3.81 (*)    Hemoglobin 11.5 (*)    HCT 33.3 (*)    All other components within normal limits  URINE MICROSCOPIC-ADD ON - Abnormal; Notable for the following:    Squamous Epithelial / LPF FEW (*)    Casts HYALINE CASTS (*)    All other  components within normal limits  BASIC METABOLIC PANEL - Abnormal; Notable for the following:    Sodium 134 (*)    Glucose, Bld 228 (*)    BUN 62 (*)    Creatinine, Ser 5.35 (*)    GFR calc non Af Amer 8 (*)    GFR calc Af Amer 9 (*)    All other components within normal limits  CBC - Abnormal; Notable for the following:    RBC 3.35 (*)    Hemoglobin 10.2 (*)    HCT 29.7 (*)    All other components within normal limits  GLUCOSE, CAPILLARY - Abnormal; Notable for the following:    Glucose-Capillary 235 (*)    All other components within normal limits  GLUCOSE, CAPILLARY - Abnormal; Notable for the following:    Glucose-Capillary 184 (*)    All other components within normal limits  HEMOGLOBIN A1C - Abnormal; Notable for the following:    Hemoglobin A1C 9.2 (*)    Mean Plasma Glucose 217 (*)    All other components within normal limits  GLUCOSE, CAPILLARY - Abnormal; Notable for the following:    Glucose-Capillary 163 (*)    All other components within normal limits  GLUCOSE, CAPILLARY - Abnormal; Notable for the following:    Glucose-Capillary 146 (*)    All other components within normal limits  CBC - Abnormal; Notable for the following:    RBC 3.51 (*)    Hemoglobin 10.5 (*)    HCT 31.0 (*)    All other components within normal limits  BASIC METABOLIC PANEL - Abnormal; Notable for the following:    Glucose, Bld 153 (*)    BUN 60 (*)    Creatinine, Ser 4.25 (*)    GFR calc non Af Amer 11 (*)    GFR calc Af Amer 12 (*)    All other components within normal limits  GLUCOSE, CAPILLARY - Abnormal; Notable for the following:    Glucose-Capillary 263 (*)    All other components within normal limits  GLUCOSE, CAPILLARY - Abnormal; Notable for the following:    Glucose-Capillary 131 (*)    All other components within normal limits  GLUCOSE, CAPILLARY - Abnormal; Notable for the following:    Glucose-Capillary 124 (*)  All other components within normal limits  GLUCOSE,  CAPILLARY - Abnormal; Notable for the following:    Glucose-Capillary 205 (*)    All other components within normal limits  CBC - Abnormal; Notable for the following:    RBC 3.52 (*)    Hemoglobin 10.5 (*)    HCT 31.3 (*)    All other components within normal limits  BASIC METABOLIC PANEL - Abnormal; Notable for the following:    Glucose, Bld 248 (*) REPEATED TO VERIFY   BUN 49 (*) REPEATED TO VERIFY   Creatinine, Ser 2.81 (*)    GFR calc non Af Amer 17 (*)    GFR calc Af Amer 20 (*)    All other components within normal limits  GLUCOSE, CAPILLARY - Abnormal; Notable for the following:    Glucose-Capillary 276 (*)    All other components within normal limits  GLUCOSE, CAPILLARY - Abnormal; Notable for the following:    Glucose-Capillary 158 (*)    All other components within normal limits  URINE CULTURE  TSH  SURGICAL PCR SCREEN  SURGICAL PCR SCREEN     Procedure(s):   Ct Abdomen Pelvis Wo Contrast  01/17/2012  *RADIOLOGY REPORT*  Clinical Data: Right flank pain.  CT ABDOMEN AND PELVIS WITHOUT CONTRAST  Technique:  Multidetector CT imaging of the abdomen and pelvis was performed following the standard protocol without intravenous contrast.  Comparison: 04/24/2009  Findings: Stable punctate nodule at the left lung base.  No evidence for free air. Unenhanced CT was performed per clinician order.  Lack of IV contrast limits sensitivity and specificity, especially for evaluation of abdominal/pelvic solid viscera.  There is moderate to severe right hydroureteronephrosis.  There is stranding around the mid right ureter.  There are multiple stones in the distal right ureter that extend to the right ureterovesical junction.  The most distal stone measures up to 0.6 cm. There is stranding and edema around the right kidney.  There are additional small stones within the right kidney and there are multiple stones in the left kidney.  Index left kidney stone roughly measures 0.5 cm.  There is no  left hydronephrosis.  No gross abnormality to the liver, gallbladder, spleen, pancreas or adrenal glands.  Again noted are small retroperitoneal lymph nodes which have not significantly changed.  No gross abnormality to the uterus or adnexa tissue.  No acute bony abnormality.  IMPRESSION: Moderate to severe right hydroureteronephrosis due to multiple stones in the distal right ureter.  The stones extend to the right ureterovesical junction.  Bilateral renal calculi.  Original Report Authenticated By: Richarda Overlie, M.D.   Dg Abd 1 View  01/19/2012  *RADIOLOGY REPORT*  Clinical Data: 61 year old female with right-side renal stones. Reports several stones have passed since admission.  ABDOMEN - 1 VIEW  Comparison: CT abdomen and pelvis 01/17/2012.  Findings: Multiple distal right ureteral stones demonstrated on the comparison were only faintly radiopaque on the scout view of that study.  There does appear to be a residual oval or teardrop shaped stone projecting partially over the right bladder contour measuring up to 4 x 7 mm.  Nephrolithiasis seen on the comparison is not evident.  Grossly negative lung bases. Nonobstructed bowel gas pattern. No acute osseous abnormality identified.  IMPRESSION: One residual distal right ureteral stone is identified, likely just proximal to the right UVJ, and measuring up to 4 x 7 mm.  Original Report Authenticated By: Harley Hallmark, M.D.       Brief H  and P: For complete details please refer to admission H and P, but in brief   61 year old female presented to [facility] ED with 2 day history right flank pain that radiates to the right lower quadrant. She noted some blood in her urine. She has a history of kidney stones in the past. She denied any fevers. She denied any nausea or vomiting. She reported decreased by mouth intake over the last day. She was found to have renal failure and nephrolithiasis with significant hydronephrosis. She was admitted to medicine service.       Hospital Course:   Principal Problem:  *Acute renal failure Active Problems:  DIABETES MELLITUS, TYPE II  HYPERTENSION  Hydronephrosis  Nephrolithiasis  *Acute on chronic renal failure: secondary to severe right hydroureteronephrosis/ obstruction due to multiple stones per CT. Pt admitted to medicine. Urology consulted and scheduled for stenting procedure. Before procedure pt passed stones. Case discussed with nephrology who agreed with plan support with IV fluids and if creatinine did not improve to call for consult. Creatinine on admission 4.98 worsening to 5.35 on 01/18/12. At discharge creatinine 2.81.  Pt instructed to stop Mobic. Pt will follow up with PCP in 3 days for BMET.  Active Problems:  DIABETES MELLITUS, TYPE II.  Managed with home lantus and SSI. CBG range 184-235. A1c 9.2. Pt will be discharged on home regimen. Seen by in patient diabetes coordinator. Will follow up with PCP for optimal glycemic control.   HYPERTENSION: Fair control. SBP range 137-108. Lopressor continued but HCTZ held secondary to #1. Will be discharged with BB only. Will follow up with PCP for optimal BP control.  Hydronephrosis:  Pt received Rocephin for 3 days and will be discharged with 3 days cipro.  Urine culture without predominant organism. Afebrile and non-toxic. See #1  Nephrolithiasis:. Pt seen by urology in ED and scheduled for stent placement the next day. Before surgery pt passed stone and creatinine improved. Follow up KUB indicated one stone remaining which patient passed as well. Urology followed and recommended no surgery and discharge with  follow up with them 5 days.     Hypothyroidism: TSH 1.8. Continue synthroid     Time spent on Discharge: 35 minutes  Signed: Gwenyth Bender 01/20/2012, 10:26 AM

## 2012-01-20 NOTE — Discharge Summary (Signed)
I have directly reviewed the clinical findings, lab, imaging studies and management of this patient in detail. I have interviewed and examined the patient and agree with the documentation,  as recorded by the Physician extender.   I have requested the patient personally to not take Mobic or any NSAID product to her creatinine function has normalized and she has been cleared to resume these medications by her primary care physician. I called and discussed this with the patient personally prior to her leaving the hospital.   Leroy Sea M.D on 01/20/2012 at 1:43 PM  [facility] Office  [phone]

## 2012-01-20 NOTE — ED Provider Notes (Signed)
Medical screening examination/treatment/procedure(s) were conducted as a shared visit with non-physician practitioner(s) and myself.  I personally evaluated the patient during the encounter  Please see my other notes for complete documentation  Lyanne Co, MD 01/20/12 [phone]

## 2012-07-03 ENCOUNTER — Emergency Department (HOSPITAL_COMMUNITY)
Admission: EM | Admit: 2012-07-03 | Discharge: 2012-07-03 | Disposition: A | Discharge status: Home / Self Care | Payer: Medicare Other | Attending: Emergency Medicine | Admitting: Emergency Medicine

## 2012-07-03 ENCOUNTER — Encounter (HOSPITAL_COMMUNITY): Payer: Self-pay | Admitting: Emergency Medicine

## 2012-07-03 DIAGNOSIS — R319 Hematuria, unspecified: Secondary | ICD-10-CM

## 2012-07-03 DIAGNOSIS — E669 Obesity, unspecified: Secondary | ICD-10-CM | POA: Insufficient documentation

## 2012-07-03 DIAGNOSIS — F3289 Other specified depressive episodes: Secondary | ICD-10-CM | POA: Insufficient documentation

## 2012-07-03 DIAGNOSIS — Z794 Long term (current) use of insulin: Secondary | ICD-10-CM | POA: Insufficient documentation

## 2012-07-03 DIAGNOSIS — H269 Unspecified cataract: Secondary | ICD-10-CM | POA: Insufficient documentation

## 2012-07-03 DIAGNOSIS — E119 Type 2 diabetes mellitus without complications: Secondary | ICD-10-CM | POA: Insufficient documentation

## 2012-07-03 DIAGNOSIS — Z79899 Other long term (current) drug therapy: Secondary | ICD-10-CM | POA: Insufficient documentation

## 2012-07-03 DIAGNOSIS — F329 Major depressive disorder, single episode, unspecified: Secondary | ICD-10-CM | POA: Insufficient documentation

## 2012-07-03 DIAGNOSIS — Z7982 Long term (current) use of aspirin: Secondary | ICD-10-CM | POA: Insufficient documentation

## 2012-07-03 DIAGNOSIS — I129 Hypertensive chronic kidney disease with stage 1 through stage 4 chronic kidney disease, or unspecified chronic kidney disease: Secondary | ICD-10-CM | POA: Insufficient documentation

## 2012-07-03 DIAGNOSIS — E039 Hypothyroidism, unspecified: Secondary | ICD-10-CM | POA: Insufficient documentation

## 2012-07-03 DIAGNOSIS — N200 Calculus of kidney: Secondary | ICD-10-CM

## 2012-07-03 DIAGNOSIS — N189 Chronic kidney disease, unspecified: Secondary | ICD-10-CM | POA: Insufficient documentation

## 2012-07-03 LAB — CBC WITH DIFFERENTIAL/PLATELET
Basophils Relative: 0 % (ref 0–1)
HCT: 32.5 % — ABNORMAL LOW (ref 36.0–46.0)
Hemoglobin: 11.4 g/dL — ABNORMAL LOW (ref 12.0–15.0)
Lymphs Abs: 2 10*3/uL (ref 0.7–4.0)
MCHC: 35.1 g/dL (ref 30.0–36.0)
Monocytes Absolute: 0.5 10*3/uL (ref 0.1–1.0)
Monocytes Relative: 6 % (ref 3–12)
Neutro Abs: 6.1 10*3/uL (ref 1.7–7.7)
RBC: 3.86 MIL/uL — ABNORMAL LOW (ref 3.87–5.11)

## 2012-07-03 LAB — BASIC METABOLIC PANEL
BUN: 44 mg/dL — ABNORMAL HIGH (ref 6–23)
CO2: 23 mEq/L (ref 19–32)
Chloride: 101 mEq/L (ref 96–112)
Creatinine, Ser: 1.66 mg/dL — ABNORMAL HIGH (ref 0.50–1.10)
GFR calc Af Amer: 38 mL/min — ABNORMAL LOW (ref >90)
Glucose, Bld: 364 mg/dL — ABNORMAL HIGH (ref 70–99)

## 2012-07-03 LAB — URINE MICROSCOPIC-ADD ON

## 2012-07-03 LAB — URINALYSIS, ROUTINE W REFLEX MICROSCOPIC
Bilirubin Urine: NEGATIVE
Glucose, UA: 100 mg/dL — AB
Ketones, ur: NEGATIVE mg/dL
pH: 5.5 (ref 5.0–8.0)

## 2012-07-03 MED ORDER — SODIUM CHLORIDE 0.9 % IV BOLUS (SEPSIS)
1000.0000 mL | Freq: Once | INTRAVENOUS | Status: AC
  Administered 2012-07-03: 1000 mL via INTRAVENOUS

## 2012-07-03 MED ORDER — ONDANSETRON HCL 4 MG/2ML IJ SOLN
4.0000 mg | Freq: Once | INTRAMUSCULAR | Status: AC
  Administered 2012-07-03: 4 mg via INTRAVENOUS
  Filled 2012-07-03: qty 2

## 2012-07-03 MED ORDER — FENTANYL CITRATE 0.05 MG/ML IJ SOLN
50.0000 ug | Freq: Once | INTRAMUSCULAR | Status: AC
  Administered 2012-07-03: 50 ug via INTRAVENOUS
  Filled 2012-07-03: qty 2

## 2012-07-03 NOTE — ED Notes (Signed)
Pt has hx of kidney stones and UTIs.  States that she had blood in her urine both last night and this morning.  States that it burns toward the end of urination.

## 2012-07-03 NOTE — ED Provider Notes (Signed)
History     CSN: [id number]  Arrival date & time 07/03/12  1051   First MD Initiated Contact with Patient 07/03/12 1154      Chief Complaint  Patient presents with  . Hematuria    (Consider location/radiation/quality/duration/timing/severity/associated sxs/prior treatment) HPI  She presents to the emergency department with a history of nephrolithiasis with complaints of hematuria that started this morning. She denies having any pain but admits it burns when she urinates. She has a long history of significant stones but denies this feeling the same. She has not had fevers, chills, nausea, vomiting  Or diarrhea. She is a diabetic, hypertension, hypthyroidism, CKD, and depression. Her Urologist is doctor Ottelin.  Past Medical History  Diagnosis Date  . Diabetes mellitus   . Hypertension   . Hypothyroidism   . Cataract     Left eye( Dr. Dione Booze, patient uninsured so she can't  get  an intervention)  . Chronic kidney disease     nephrolithiasis, s/p removal 12/05/07  . Back pain     myofascial, excacerberated in 6-7/09, 9/09- required narcotics at those times  . Elbow fracture, right   . Obesity   . Depression     better on nortriptyline  . Hyperplastic colon polyp 2001    f/u colonoscopy 11/23/06 was normal( Dr. Leone Payor), falls into normal risk screening( next colonoscopy due in 2018)  . Pap smear for cervical cancer screening     12/14/2005: normal, evidence of candida. 05/16/2007: normal, benign reperative changes.    No past surgical history on file.  No family history on file.  History  Substance Use Topics  . Smoking status: Never Smoker   . Smokeless tobacco: Not on file  . Alcohol Use: No    OB History    Grav Para Term Preterm Abortions TAB SAB Ect Mult Living                  Review of Systems  Review of Systems  Gen: no weight loss, fevers, chills, night sweats  Neck: no neck pain  Lungs:No wheezing, coughing or hemoptysis CV: no chest pain,  palpitations, dependent edema or orthopnea  Abd: no abdominal pain, nausea, vomiting  GU: + dysuria or gross hematuria  MSK:  No abnormalities  Neuro: no headache, no focal neurologic deficits  Skin: no abnormalities Psyche: negative.   Allergies  Review of patient's allergies indicates no known allergies.  Home Medications   Current Outpatient Rx  Name  Route  Sig  Dispense  Refill  . ASPIRIN 81 MG PO TABS   Oral   Take 81 mg by mouth daily.           . DULOXETINE HCL 30 MG PO CPEP   Oral   Take 30 mg by mouth every morning.          . DULOXETINE HCL 60 MG PO CPEP   Oral   Take 60 mg by mouth every evening.           . INSULIN GLARGINE 100 UNIT/ML [place] SOLN   Subcutaneous   Inject 74 Units into the skin at bedtime.          Marland Kitchen LEVOTHYROXINE SODIUM 150 MCG PO TABS   Oral   Take 150 mcg by mouth daily.           Marland Kitchen LIRAGLUTIDE 18 MG/3ML  SOLN   Subcutaneous   Inject 1.2 mg into the skin daily.          Marland Kitchen  METOPROLOL TARTRATE 50 MG PO TABS   Oral   Take 1 tablet (50 mg total) by mouth daily.   30 tablet   0     BP 143/80  Pulse 102  Temp 98.6 F (37 C) (Oral)  Resp 16  SpO2 94%  Physical Exam  Nursing note and vitals reviewed. Constitutional: She appears well-developed and well-nourished. No distress.  HENT:  Head: Normocephalic and atraumatic.  Eyes: Pupils are equal, round, and reactive to light.  Neck: Normal range of motion. Neck supple.  Cardiovascular: Normal rate and regular rhythm.   Pulmonary/Chest: Effort normal.  Abdominal: Soft. There is no tenderness. There is no rebound, no guarding and no CVA tenderness.  Neurological: She is alert.  Skin: Skin is warm and dry.    ED Course  Procedures (including critical care time)  Labs Reviewed  URINALYSIS, ROUTINE W REFLEX MICROSCOPIC - Abnormal; Notable for the following:    Color, Urine AMBER (*)  BIOCHEMICALS MAY BE AFFECTED BY COLOR   APPearance CLOUDY (*)     Glucose, UA 100  (*)     Hgb urine dipstick LARGE (*)     Protein, ur >300 (*)     Leukocytes, UA SMALL (*)     All other components within normal limits  CBC WITH DIFFERENTIAL - Abnormal; Notable for the following:    RBC 3.86 (*)     Hemoglobin 11.4 (*)     HCT 32.5 (*)     All other components within normal limits  BASIC METABOLIC PANEL - Abnormal; Notable for the following:    Glucose, Bld 364 (*)     BUN 44 (*)     Creatinine, Ser 1.66 (*)     GFR calc non Af Amer 33 (*)     GFR calc Af Amer 38 (*)     All other components within normal limits  URINE MICROSCOPIC-ADD ON  URINE CULTURE   No results found.   1. Hematuria   2. Nephrolithiasis       MDM  Labs show that she has no infection but gross hematuria. Her kidney function is stable. Labs are reassuring at this time. I spoke with Dr. Vernie Ammons guarding patient and reviewed labs and patient's symptoms and physical exam with him. He states that she needs to give him a call if you have scheduled to get her in this week for further evaluation. He'll most likely due to some form of imaging in the office. I discussed the plan with the patient who is agreeable and understanding.  Pt has been advised of the symptoms that warrant their return to the ED. Patient has voiced understanding and has agreed to follow-up with the PCP or specialist.         Dorthula Matas, PA 07/03/12 1346

## 2012-07-03 NOTE — ED Provider Notes (Signed)
Medical screening examination/treatment/procedure(s) were performed by non-physician practitioner and as supervising physician I was immediately available for consultation/collaboration.   Rolan Bucco, MD 07/03/12 1426

## 2012-07-05 LAB — URINE CULTURE: Colony Count: >=100000

## 2012-07-08 ENCOUNTER — Telehealth (HOSPITAL_COMMUNITY): Payer: Self-pay | Admitting: Emergency Medicine

## 2012-07-08 NOTE — ED Notes (Signed)
+  Urine. Chart sent to EDP office for review. Chart returned from EDP office. Prescribed Bactrim DS 1 tab PO BID x 7 days. Prescribed by Trixie Dredge PA-C.

## 2012-07-09 ENCOUNTER — Other Ambulatory Visit: Payer: Self-pay | Admitting: Specialist

## 2012-07-09 DIAGNOSIS — Z1231 Encounter for screening mammogram for malignant neoplasm of breast: Secondary | ICD-10-CM

## 2012-07-13 ENCOUNTER — Telehealth (HOSPITAL_COMMUNITY): Payer: Self-pay | Admitting: Emergency Medicine

## 2012-08-20 ENCOUNTER — Ambulatory Visit: Payer: Medicare Other

## 2013-01-06 ENCOUNTER — Emergency Department (HOSPITAL_COMMUNITY): Payer: Medicare Other

## 2013-01-06 ENCOUNTER — Encounter (HOSPITAL_COMMUNITY): Payer: Self-pay | Admitting: Nurse Practitioner

## 2013-01-06 ENCOUNTER — Emergency Department (HOSPITAL_COMMUNITY)
Admission: EM | Admit: 2013-01-06 | Discharge: 2013-01-06 | Disposition: A | Discharge status: Home / Self Care | Payer: Medicare Other | Attending: Emergency Medicine | Admitting: Emergency Medicine

## 2013-01-06 DIAGNOSIS — N289 Disorder of kidney and ureter, unspecified: Secondary | ICD-10-CM

## 2013-01-06 DIAGNOSIS — I129 Hypertensive chronic kidney disease with stage 1 through stage 4 chronic kidney disease, or unspecified chronic kidney disease: Secondary | ICD-10-CM | POA: Insufficient documentation

## 2013-01-06 DIAGNOSIS — Z794 Long term (current) use of insulin: Secondary | ICD-10-CM | POA: Insufficient documentation

## 2013-01-06 DIAGNOSIS — F3289 Other specified depressive episodes: Secondary | ICD-10-CM | POA: Insufficient documentation

## 2013-01-06 DIAGNOSIS — N189 Chronic kidney disease, unspecified: Secondary | ICD-10-CM | POA: Insufficient documentation

## 2013-01-06 DIAGNOSIS — M940 Chondrocostal junction syndrome [Tietze]: Secondary | ICD-10-CM | POA: Insufficient documentation

## 2013-01-06 DIAGNOSIS — H269 Unspecified cataract: Secondary | ICD-10-CM | POA: Insufficient documentation

## 2013-01-06 DIAGNOSIS — Z79899 Other long term (current) drug therapy: Secondary | ICD-10-CM | POA: Insufficient documentation

## 2013-01-06 DIAGNOSIS — Z7982 Long term (current) use of aspirin: Secondary | ICD-10-CM | POA: Insufficient documentation

## 2013-01-06 DIAGNOSIS — F329 Major depressive disorder, single episode, unspecified: Secondary | ICD-10-CM | POA: Insufficient documentation

## 2013-01-06 DIAGNOSIS — Z8719 Personal history of other diseases of the digestive system: Secondary | ICD-10-CM | POA: Insufficient documentation

## 2013-01-06 DIAGNOSIS — E119 Type 2 diabetes mellitus without complications: Secondary | ICD-10-CM | POA: Insufficient documentation

## 2013-01-06 LAB — CBC
HCT: 32.1 % — ABNORMAL LOW (ref 36.0–46.0)
MCV: 85.4 fL (ref 78.0–100.0)
Platelets: 269 10*3/uL (ref 150–400)
RBC: 3.76 MIL/uL — ABNORMAL LOW (ref 3.87–5.11)
RDW: 14.3 % (ref 11.5–15.5)
WBC: 10.1 10*3/uL (ref 4.0–10.5)

## 2013-01-06 LAB — BASIC METABOLIC PANEL
CO2: 25 mEq/L (ref 19–32)
Chloride: 100 mEq/L (ref 96–112)
Creatinine, Ser: 1.67 mg/dL — ABNORMAL HIGH (ref 0.50–1.10)
GFR calc Af Amer: 37 mL/min — ABNORMAL LOW (ref >90)
Potassium: 4.5 mEq/L (ref 3.5–5.1)

## 2013-01-06 LAB — POCT I-STAT TROPONIN I

## 2013-01-06 LAB — PRO B NATRIURETIC PEPTIDE: Pro B Natriuretic peptide (BNP): 109.1 pg/mL (ref 0–125)

## 2013-01-06 MED ORDER — HYDROCODONE-ACETAMINOPHEN 5-325 MG PO TABS: 2.0000 | ORAL_TABLET | ORAL | Status: DC | PRN

## 2013-01-06 NOTE — ED Provider Notes (Signed)
History     CSN: [id number]  Arrival date & time 01/06/13  1351   First MD Initiated Contact with Patient 01/06/13 1408      Chief Complaint  Patient presents with  . Chest Pain     HPI Patient presents with pain in the left costochondral area which began 3 days ago then went to her left shoulder.  Patient denies diaphoresis nausea or vomiting.  Patient has known history of diabetes and hypertension along with renal insufficiency.  She has no known coronary disease.  Patient does not smoke cigarettes.  Patient states the pain is worse with palpation.     Past Medical History  Diagnosis Date  . Diabetes mellitus   . Hypertension   . Hypothyroidism   . Cataract     Left eye( Dr. Dione Booze, patient uninsured so she can't  get  an intervention)  . Chronic kidney disease     nephrolithiasis, s/p removal 12/05/07  . Back pain     myofascial, excacerberated in 6-7/09, 9/09- required narcotics at those times  . Elbow fracture, right   . Obesity   . Depression     better on nortriptyline  . Hyperplastic colon polyp 2001    f/u colonoscopy 11/23/06 was normal( Dr. Leone Payor), falls into normal risk screening( next colonoscopy due in 2018)  . Pap smear for cervical cancer screening     12/14/2005: normal, evidence of candida. 05/16/2007: normal, benign reperative changes.    History reviewed. No pertinent past surgical history.  History reviewed. No pertinent family history.  History  Substance Use Topics  . Smoking status: Never Smoker   . Smokeless tobacco: Not on file  . Alcohol Use: No    OB History   Grav Para Term Preterm Abortions TAB SAB Ect Mult Living                  Review of Systems All other systems reviewed and are negative Allergies  Review of patient's allergies indicates no known allergies.  Home Medications   Current Outpatient Rx  Name  Route  Sig  Dispense  Refill  . aspirin 81 MG tablet   Oral   Take 81 mg by mouth daily.           . Cetirizine  HCl (ZYRTEC PO)   Oral   Take 10 mg by mouth daily as needed (allergies).         . DULoxetine (CYMBALTA) 30 MG capsule   Oral   Take 30 mg by mouth every morning.          . DULoxetine (CYMBALTA) 60 MG capsule   Oral   Take 60 mg by mouth every evening.           . insulin glargine (LANTUS) 100 UNIT/ML injection   Subcutaneous   Inject 74 Units into the skin at bedtime.          Marland Kitchen levothyroxine (SYNTHROID, LEVOTHROID) 150 MCG tablet   Oral   Take 150 mcg by mouth daily.           . Liraglutide (VICTOZA) 18 MG/3ML SOLN   Subcutaneous   Inject 1.2 mg into the skin daily.          Marland Kitchen lisinopril (PRINIVIL,ZESTRIL) 10 MG tablet   Oral   Take 10 mg by mouth daily.         . metoprolol (LOPRESSOR) 50 MG tablet   Oral   Take 1 tablet (50 mg total)  by mouth daily.   30 tablet   0   . ranitidine (ZANTAC) 300 MG tablet   Oral   Take 300 mg by mouth daily as needed for heartburn.         Marland Kitchen HYDROcodone-acetaminophen (NORCO/VICODIN) 5-325 MG per tablet   Oral   Take 2 tablets by mouth every 4 (four) hours as needed for pain.   20 tablet   0     BP 127/65  Pulse 70  Temp(Src) 98.2 F (36.8 C) (Oral)  Resp 15  SpO2 100%  Physical Exam  Nursing note and vitals reviewed. Constitutional: She is oriented to person, place, and time. She appears well-developed and well-nourished. No distress.  HENT:  Head: Normocephalic and atraumatic.  Eyes: Pupils are equal, round, and reactive to light.  Neck: Normal range of motion.  Cardiovascular: Normal rate and intact distal pulses.   Pulmonary/Chest: No respiratory distress.    Pain is reproducible to palpation  Abdominal: Normal appearance. She exhibits no distension. There is no tenderness.  Musculoskeletal: Normal range of motion.  Neurological: She is alert and oriented to person, place, and time. No cranial nerve deficit.  Skin: Skin is warm and dry. No rash noted.  Psychiatric: She has a normal mood and  affect. Her behavior is normal.      ED Course  Procedures (including critical care time) Results for orders placed during the hospital encounter of 01/06/13  CBC      Result Value Range   WBC 10.1  4.0 - 10.5 K/uL   RBC 3.76 (*) 3.87 - 5.11 MIL/uL   Hemoglobin 11.2 (*) 12.0 - 15.0 g/dL   HCT 16.1 (*) 09.6 - 04.5 %   MCV 85.4  78.0 - 100.0 fL   MCH 29.8  26.0 - 34.0 pg   MCHC 34.9  30.0 - 36.0 g/dL   RDW 40.9  81.1 - 91.4 %   Platelets 269  150 - 400 K/uL  BASIC METABOLIC PANEL      Result Value Range   Sodium 136  135 - 145 mEq/L   Potassium 4.5  3.5 - 5.1 mEq/L   Chloride 100  96 - 112 mEq/L   CO2 25  19 - 32 mEq/L   Glucose, Bld 237 (*) 70 - 99 mg/dL   BUN 34 (*) 6 - 23 mg/dL   Creatinine, Ser 7.82 (*) 0.50 - 1.10 mg/dL   Calcium 9.1  8.4 - 95.6 mg/dL   GFR calc non Af Amer 32 (*) >90 mL/min   GFR calc Af Amer 37 (*) >90 mL/min  PRO B NATRIURETIC PEPTIDE      Result Value Range   Pro B Natriuretic peptide (BNP) 109.1  0 - 125 pg/mL  POCT I-STAT TROPONIN I      Result Value Range   Troponin i, poc 0.02  0.00 - 0.08 ng/mL   Comment 3            Dg Chest 2 View  01/06/2013   *RADIOLOGY REPORT*  Clinical Data: Chest pain and shortness of breath.  CHEST - 2 VIEW  Comparison: None.  Findings: Poor inspiration.  Normal sized heart.  Clear lungs with normal vascularity.  Thoracic spine degenerative changes and mild scoliosis.  IMPRESSION: No acute abnormality.   Original Report Authenticated By: Beckie Salts, M.D.     Dg Chest 2 View  01/06/2013   *RADIOLOGY REPORT*  Clinical Data: Chest pain and shortness of breath.  CHEST - 2 VIEW  Comparison: None.  Findings: Poor inspiration.  Normal sized heart.  Clear lungs with normal vascularity.  Thoracic spine degenerative changes and mild scoliosis.  IMPRESSION: No acute abnormality.   Original Report Authenticated By: Beckie Salts, M.D.     1. Costochondritis   2. Renal insufficiency       MDM          Nelia Shi, MD 01/06/13 1623

## 2013-01-06 NOTE — ED Notes (Signed)
Pt states that 2 days ago she began having pain in left clavicle, then left shoulder pain, and then pressure in chest. Pt states that pain is worse with movement and palpation. Pt in NAD. No sob, diaphoresis, n/v.

## 2013-01-06 NOTE — ED Notes (Signed)
States L arm pain onset 3 days ago, radiating into chest yesterday. "feels like someone punched me in the chest." states pain is constant and nothing relieves. No sob or nausea. A&Ox4, resp e/u

## 2013-01-06 NOTE — ED Notes (Signed)
Patient transported to X-ray 

## 2014-01-01 ENCOUNTER — Other Ambulatory Visit: Payer: Self-pay

## 2014-01-01 DIAGNOSIS — Z1231 Encounter for screening mammogram for malignant neoplasm of breast: Secondary | ICD-10-CM

## 2014-01-15 ENCOUNTER — Encounter (INDEPENDENT_AMBULATORY_CARE_PROVIDER_SITE_OTHER): Payer: Self-pay

## 2014-01-15 ENCOUNTER — Ambulatory Visit
Admission: RE | Admit: 2014-01-15 | Discharge: 2014-01-15 | Disposition: A | Discharge status: Home / Self Care | Payer: Medicare Other | Source: Ambulatory Visit

## 2014-01-15 DIAGNOSIS — Z1231 Encounter for screening mammogram for malignant neoplasm of breast: Secondary | ICD-10-CM

## 2014-08-28 ENCOUNTER — Encounter (HOSPITAL_COMMUNITY): Payer: Self-pay | Admitting: Urology

## 2014-10-15 ENCOUNTER — Encounter: Payer: Self-pay | Admitting: Specialist

## 2015-03-18 ENCOUNTER — Encounter: Payer: Self-pay | Admitting: Podiatry

## 2015-03-18 ENCOUNTER — Ambulatory Visit (INDEPENDENT_AMBULATORY_CARE_PROVIDER_SITE_OTHER): Payer: Medicare Other | Admitting: Podiatry

## 2015-03-18 VITALS — BP 106/61 | HR 75 | Resp 17

## 2015-03-18 DIAGNOSIS — B351 Tinea unguium: Secondary | ICD-10-CM | POA: Diagnosis not present

## 2015-03-18 DIAGNOSIS — M79676 Pain in unspecified toe(s): Secondary | ICD-10-CM | POA: Diagnosis not present

## 2015-03-18 DIAGNOSIS — E1149 Type 2 diabetes mellitus with other diabetic neurological complication: Secondary | ICD-10-CM

## 2015-03-18 NOTE — Progress Notes (Signed)
Patient ID: Tami Elliott, female   DOB: [birth year], 64 y.o.   MRN: [id number] Complaint:  Visit Type: Patient returns to my office for continued preventative foot care services. Complaint: Patient states" my nails have grown long and thick and become painful to walk and wear shoes" Patient has been diagnosed with DM with neuropathy.. The patient presents for preventative foot care services. No changes to ROS  Podiatric Exam: Vascular: dorsalis pedis and posterior tibial pulses are palpable bilateral. Capillary return is immediate. Temperature gradient is WNL. Skin turgor WNL  Sensorium: Diminished  Semmes Weinstein monofilament test. Normal tactile sensation bilaterally. Nail Exam: Pt has thick disfigured discolored nails with subungual debris noted bilateral entire nail hallux through fifth toenails Ulcer Exam: There is no evidence of ulcer or pre-ulcerative changes or infection. Orthopedic Exam: Muscle tone and strength are WNL. No limitations in general ROM. No crepitus or effusions noted. Foot type and digits show no abnormalities. Bony prominences are unremarkable. Skin: No Porokeratosis. No infection or ulcers  Diagnosis:  Onychomycosis, , Pain in right toe, pain in left toes, Diabetic neuropathy  Treatment & Plan Procedures and Treatment: Consent by patient was obtained for treatment procedures. The patient understood the discussion of treatment and procedures well. All questions were answered thoroughly reviewed. Debridement of mycotic and hypertrophic toenails, 1 through 5 bilateral and clearing of subungual debris. No ulceration, no infection noted.  Return Visit-Office Procedure: Patient instructed to return to the office for a follow up visit 3 months for continued evaluation and treatment. RTC 3 mos

## 2015-03-23 ENCOUNTER — Other Ambulatory Visit: Payer: Self-pay | Admitting: *Deleted

## 2015-03-23 DIAGNOSIS — Z01810 Encounter for preprocedural cardiovascular examination: Secondary | ICD-10-CM

## 2015-03-23 DIAGNOSIS — N184 Chronic kidney disease, stage 4 (severe): Secondary | ICD-10-CM

## 2015-03-25 ENCOUNTER — Other Ambulatory Visit: Payer: Self-pay

## 2015-03-25 DIAGNOSIS — Z1231 Encounter for screening mammogram for malignant neoplasm of breast: Secondary | ICD-10-CM

## 2015-04-24 ENCOUNTER — Encounter: Payer: Self-pay | Admitting: Surgery

## 2015-04-27 ENCOUNTER — Other Ambulatory Visit: Payer: Self-pay

## 2015-04-27 ENCOUNTER — Encounter: Payer: Self-pay | Admitting: Surgery

## 2015-04-27 ENCOUNTER — Ambulatory Visit (HOSPITAL_COMMUNITY)
Admission: RE | Admit: 2015-04-27 | Discharge: 2015-04-27 | Disposition: A | Discharge status: Home / Self Care | Payer: Medicare Other | Source: Ambulatory Visit | Attending: Surgery | Admitting: Surgery

## 2015-04-27 ENCOUNTER — Ambulatory Visit (INDEPENDENT_AMBULATORY_CARE_PROVIDER_SITE_OTHER)
Admission: RE | Admit: 2015-04-27 | Discharge: 2015-04-27 | Disposition: A | Discharge status: Home / Self Care | Payer: Medicare Other | Source: Ambulatory Visit | Attending: Surgery | Admitting: Surgery

## 2015-04-27 ENCOUNTER — Ambulatory Visit (INDEPENDENT_AMBULATORY_CARE_PROVIDER_SITE_OTHER): Payer: Medicare Other | Admitting: Surgery

## 2015-04-27 VITALS — BP 151/75 | HR 72 | Temp 98.1°F | Resp 18 | Ht 64.0 in | Wt 269.9 lb

## 2015-04-27 DIAGNOSIS — I10 Essential (primary) hypertension: Secondary | ICD-10-CM | POA: Insufficient documentation

## 2015-04-27 DIAGNOSIS — I129 Hypertensive chronic kidney disease with stage 1 through stage 4 chronic kidney disease, or unspecified chronic kidney disease: Secondary | ICD-10-CM | POA: Diagnosis not present

## 2015-04-27 DIAGNOSIS — Z01810 Encounter for preprocedural cardiovascular examination: Secondary | ICD-10-CM | POA: Diagnosis not present

## 2015-04-27 DIAGNOSIS — N184 Chronic kidney disease, stage 4 (severe): Secondary | ICD-10-CM | POA: Insufficient documentation

## 2015-04-27 DIAGNOSIS — N186 End stage renal disease: Secondary | ICD-10-CM | POA: Diagnosis not present

## 2015-04-27 DIAGNOSIS — E785 Hyperlipidemia, unspecified: Secondary | ICD-10-CM | POA: Insufficient documentation

## 2015-04-27 NOTE — Progress Notes (Signed)
Patient name: Tami Elliott MRN: [id number] DOB: 22-Aug-1950 Sex: female   Referred by: Dr. Posey Pronto  Reason for referral:  Chief Complaint  Patient presents with  . New Evaluation    eval for HD access     HISTORY OF PRESENT ILLNESS: This is a 64 year old female who comes in today for evaluation of dialysis access.  Her kidney failure secondary to diabetes and hypertension.  She is right handed.  She is not yet on dialysis.  Patient suffers from diabetes, but this is been well controlled.  She states her last hemoglobin A1c was 5.9.  She is medically managed for hypertension with an ACE inhibitor.  Her blood pressures have fluctuated recently.  She has anemia of chronic disease and secondary hyperparathyroidism, secondary to her kidney disease.  She is a nonsmoker.  Past Medical History  Diagnosis Date  . Diabetes mellitus   . Hypertension   . Hypothyroidism   . Cataract     Left eye( Dr. Katy Fitch, patient uninsured so she can't  get  an intervention)  . Chronic kidney disease     nephrolithiasis, s/p removal 12/05/07  . Back pain     myofascial, excacerberated in 6-7/09, 9/09- required narcotics at those times  . Elbow fracture, right   . Obesity   . Depression     better on nortriptyline  . Hyperplastic colon polyp 2001    f/u colonoscopy 11/23/06 was normal( Dr. Carlean Purl), falls into normal risk screening( next colonoscopy due in 2018)  . Pap smear for cervical cancer screening     12/14/2005: normal, evidence of candida. 05/16/2007: normal, benign reperative changes.  . Anemia     Past Surgical History  Procedure Laterality Date  . Cataract extraction Left     Social History   Social History  . Marital Status: Married    Spouse Name: N/A  . Number of Children: N/A  . Years of Education: N/A   Occupational History  . Not on file.   Social History Main Topics  . Smoking status: Never Smoker   . Smokeless tobacco: Never Used  . Alcohol Use: No  . Drug Use:  No  . Sexual Activity: Not on file   Other Topics Concern  . Not on file   Social History Narrative   Married( 2nd marriage). Husband awaiting back surgery. BE AWARE THAT HUSBAND BROKE PAIN CONTRACT WITH [facility] SEVERAL TIMES!   Patient unemployed. Difficulty finding a jobb/c she is partially blind( but not blind enough to have disability). She used to work in [employer].. The grandson's mother basically   Son 37 y/o , unemployed and grandson lives with her. The grandson's mother basically dropped the child off when he was 16 y/o. They rarely hear from her They moved to an apartment from their house in 12/2008.    Family History  Problem Relation Age of Onset  . Diabetes Mother   . Heart disease Mother     before age 60  . Hypertension Mother     Allergies as of 04/27/2015  . (No Known Allergies)    Current Outpatient Prescriptions on File Prior to Visit  Medication Sig Dispense Refill  . aspirin 81 MG tablet Take 81 mg by mouth daily.      . DULoxetine (CYMBALTA) 30 MG capsule Take 30 mg by mouth every morning.     . DULoxetine (CYMBALTA) 60 MG capsule Take 60 mg by mouth every evening.      Marland Kitchen  insulin glargine (LANTUS) 100 UNIT/ML injection Inject 64 Units into the skin at bedtime.     Marland Kitchen levothyroxine (SYNTHROID, LEVOTHROID) 150 MCG tablet Take 150 mcg by mouth daily.      . ranitidine (ZANTAC) 300 MG tablet Take 300 mg by mouth daily as needed for heartburn.    Marland Kitchen HYDROcodone-acetaminophen (NORCO/VICODIN) 5-325 MG per tablet Take 2 tablets by mouth every 4 (four) hours as needed for pain. (Patient not taking: Reported on 04/27/2015) 20 tablet 0  . Liraglutide (VICTOZA) 18 MG/3ML SOLN Inject 1.2 mg into the skin daily.     Marland Kitchen lisinopril (PRINIVIL,ZESTRIL) 10 MG tablet Take 10 mg by mouth daily.    . metoprolol (LOPRESSOR) 50 MG tablet Take 1 tablet (50 mg total) by mouth daily. (Patient taking differently: Take 50 mg by mouth 2 (two) times daily. ) 30 tablet 0   No  current facility-administered medications on file prior to visit.     REVIEW OF SYSTEMS: Cardiovascular: No chest pain, chest pressure, palpitations, positive for shortness of breath with exertion and pain in her feet when lying flat Pulmonary: No productive cough, asthma or wheezing. Neurologic: No weakness, paresthesias, aphasia, or amaurosis. No dizziness. Hematologic: No bleeding problems or clotting disorders. Musculoskeletal: No joint pain or joint swelling. Gastrointestinal: No blood in stool or hematemesis Genitourinary: No dysuria or hematuria. Psychiatric:: No history of major depression. Integumentary: No rashes or ulcers. Constitutional: No fever or chills.  PHYSICAL EXAMINATION:  Filed Vitals:   04/27/15 1229 04/27/15 1232  BP: 150/78 151/75  Pulse: 72   Temp: 98.1 F (36.7 C)   TempSrc: Oral   Resp: 18   Height: 5\' 4"  (1.626 m)   Weight: 269 lb 14.4 oz (122.426 kg)   SpO2: 91%    Body mass index is 46.31 kg/(m^2). General: The patient appears their stated age.   HEENT:  No gross abnormalities Pulmonary: Respirations are non-labored Musculoskeletal: There are no major deformities.   Neurologic: No focal weakness or paresthesias are detected, Skin: There are no ulcer or rashes noted. Psychiatric: The patient has normal affect. Cardiovascular: There is a regular rate and rhythm without significant murmur appreciated.  Palpable radial pulse  Diagnostic Studies: I have reviewed her vein mapping which shows an adequate cephalic vein on the left.  She also has triphasic arterial waveforms   Assessment:  Chronic renal insufficiency, stage IV Plan: I discussed proceeding with a left radiocephalic fistula.  We discussed the risks and benefits of the operation including the risk of steal syndrome, and the risk of non-maturity requiring future additional interventions.  All of her questions were answered.  She is been scheduled for Friday, September 23     V.  Leia Alf, M.D. [facility] Office: [phone] Pager:  [phone]

## 2015-05-05 ENCOUNTER — Inpatient Hospital Stay: Admission: RE | Admit: 2015-05-05 | Payer: Medicare Other | Source: Ambulatory Visit

## 2015-05-07 ENCOUNTER — Encounter (HOSPITAL_COMMUNITY): Payer: Self-pay | Admitting: *Deleted

## 2015-05-07 NOTE — Progress Notes (Signed)
Pt denies SOB, chest pain, and being under the care of a cardiologist. Pt denies having a stress test, echo and cardiac cath. Pt denies having an EKG and chest x ray within the last year. Pt stated that she was instructed by the surgeons office to take half of HS insulin dose. Pt made aware to stop taking otc vitamins, herbal medications and NSAID's. Pt verbalized understanding of all pre-op instructions.

## 2015-05-08 NOTE — Progress Notes (Signed)
Pt made aware to arrive at 9:30 A.M. on Tuesday, 05/12/15. Pt verbalized understanding of all pre-op instructions.

## 2015-05-11 MED ORDER — SODIUM CHLORIDE 0.9 % IV SOLN
INTRAVENOUS | Status: DC
  Administered 2015-05-12 (×2): via INTRAVENOUS

## 2015-05-11 MED ORDER — DEXTROSE 5 % IV SOLN
1.5000 g | INTRAVENOUS | Status: AC
  Administered 2015-05-12: 1.5 g via INTRAVENOUS
  Filled 2015-05-11: qty 1.5

## 2015-05-11 MED ORDER — CHLORHEXIDINE GLUCONATE CLOTH 2 % EX PADS: 6.0000 | MEDICATED_PAD | Freq: Once | CUTANEOUS | Status: DC

## 2015-05-12 ENCOUNTER — Encounter (HOSPITAL_COMMUNITY): Payer: Self-pay | Admitting: *Deleted

## 2015-05-12 ENCOUNTER — Ambulatory Visit (HOSPITAL_COMMUNITY)
Admission: RE | Admit: 2015-05-12 | Discharge: 2015-05-12 | Disposition: A | Discharge status: Home / Self Care | Payer: Medicare Other | Source: Ambulatory Visit | Attending: Surgery | Admitting: Surgery

## 2015-05-12 ENCOUNTER — Ambulatory Visit (HOSPITAL_COMMUNITY): Payer: Medicare Other | Admitting: Anesthesiology

## 2015-05-12 ENCOUNTER — Other Ambulatory Visit: Payer: Medicare Other | Admitting: *Deleted

## 2015-05-12 ENCOUNTER — Encounter (HOSPITAL_COMMUNITY)
Admission: RE | Disposition: A | Discharge status: Home / Self Care | Payer: Self-pay | Source: Ambulatory Visit | Attending: Surgery

## 2015-05-12 DIAGNOSIS — Z79891 Long term (current) use of opiate analgesic: Secondary | ICD-10-CM | POA: Insufficient documentation

## 2015-05-12 DIAGNOSIS — G4730 Sleep apnea, unspecified: Secondary | ICD-10-CM | POA: Diagnosis not present

## 2015-05-12 DIAGNOSIS — D638 Anemia in other chronic diseases classified elsewhere: Secondary | ICD-10-CM | POA: Insufficient documentation

## 2015-05-12 DIAGNOSIS — K219 Gastro-esophageal reflux disease without esophagitis: Secondary | ICD-10-CM | POA: Diagnosis not present

## 2015-05-12 DIAGNOSIS — Z7982 Long term (current) use of aspirin: Secondary | ICD-10-CM | POA: Insufficient documentation

## 2015-05-12 DIAGNOSIS — I120 Hypertensive chronic kidney disease with stage 5 chronic kidney disease or end stage renal disease: Secondary | ICD-10-CM | POA: Diagnosis not present

## 2015-05-12 DIAGNOSIS — Z79899 Other long term (current) drug therapy: Secondary | ICD-10-CM | POA: Insufficient documentation

## 2015-05-12 DIAGNOSIS — N186 End stage renal disease: Secondary | ICD-10-CM | POA: Insufficient documentation

## 2015-05-12 DIAGNOSIS — M1990 Unspecified osteoarthritis, unspecified site: Secondary | ICD-10-CM | POA: Insufficient documentation

## 2015-05-12 DIAGNOSIS — N185 Chronic kidney disease, stage 5: Secondary | ICD-10-CM | POA: Diagnosis not present

## 2015-05-12 DIAGNOSIS — Z6841 Body Mass Index (BMI) 40.0 and over, adult: Secondary | ICD-10-CM | POA: Diagnosis not present

## 2015-05-12 DIAGNOSIS — F329 Major depressive disorder, single episode, unspecified: Secondary | ICD-10-CM | POA: Insufficient documentation

## 2015-05-12 DIAGNOSIS — Z794 Long term (current) use of insulin: Secondary | ICD-10-CM | POA: Diagnosis not present

## 2015-05-12 DIAGNOSIS — E1122 Type 2 diabetes mellitus with diabetic chronic kidney disease: Secondary | ICD-10-CM | POA: Diagnosis not present

## 2015-05-12 DIAGNOSIS — Z4931 Encounter for adequacy testing for hemodialysis: Secondary | ICD-10-CM

## 2015-05-12 DIAGNOSIS — E039 Hypothyroidism, unspecified: Secondary | ICD-10-CM | POA: Insufficient documentation

## 2015-05-12 DIAGNOSIS — E1136 Type 2 diabetes mellitus with diabetic cataract: Secondary | ICD-10-CM | POA: Diagnosis not present

## 2015-05-12 HISTORY — DX: Unspecified osteoarthritis, unspecified site: M19.90

## 2015-05-12 HISTORY — DX: Gastro-esophageal reflux disease without esophagitis: K21.9

## 2015-05-12 HISTORY — DX: Sleep apnea, unspecified: G47.30

## 2015-05-12 HISTORY — DX: Pneumonia, unspecified organism: J18.9

## 2015-05-12 HISTORY — PX: AV FISTULA PLACEMENT: SHX1204

## 2015-05-12 LAB — POCT I-STAT 4, (NA,K, GLUC, HGB,HCT)
GLUCOSE: 85 mg/dL (ref 65–99)
HEMATOCRIT: 31 % — AB (ref 36.0–46.0)
HEMOGLOBIN: 10.5 g/dL — AB (ref 12.0–15.0)
Potassium: 4.8 mmol/L (ref 3.5–5.1)
Sodium: 141 mmol/L (ref 135–145)

## 2015-05-12 LAB — GLUCOSE, CAPILLARY
GLUCOSE-CAPILLARY: 65 mg/dL (ref 65–99)
GLUCOSE-CAPILLARY: 74 mg/dL (ref 65–99)
GLUCOSE-CAPILLARY: 62 mg/dL — AB (ref 65–99)
GLUCOSE-CAPILLARY: 68 mg/dL (ref 65–99)
GLUCOSE-CAPILLARY: 93 mg/dL (ref 65–99)
Glucose-Capillary: 106 mg/dL — ABNORMAL HIGH (ref 65–99)
Glucose-Capillary: 66 mg/dL (ref 65–99)
Glucose-Capillary: 68 mg/dL (ref 65–99)

## 2015-05-12 SURGERY — ARTERIOVENOUS (AV) FISTULA CREATION
Anesthesia: Monitor Anesthesia Care | Site: Arm Lower | Laterality: Left

## 2015-05-12 MED ORDER — DEXTROSE 50 % IV SOLN
INTRAVENOUS | Status: AC
  Filled 2015-05-12: qty 50

## 2015-05-12 MED ORDER — OXYCODONE HCL 5 MG/5ML PO SOLN: 5.0000 mg | Freq: Once | ORAL | Status: DC | PRN

## 2015-05-12 MED ORDER — HEPARIN SODIUM (PORCINE) 1000 UNIT/ML IJ SOLN
INTRAMUSCULAR | Status: DC | PRN
  Administered 2015-05-12: 3000 [IU] via INTRAVENOUS

## 2015-05-12 MED ORDER — MIDAZOLAM HCL 2 MG/2ML IJ SOLN
INTRAMUSCULAR | Status: AC
  Filled 2015-05-12: qty 4

## 2015-05-12 MED ORDER — DEXTROSE 50 % IV SOLN
25.0000 mL | Freq: Once | INTRAVENOUS | Status: AC
  Administered 2015-05-12: 25 mL via INTRAVENOUS

## 2015-05-12 MED ORDER — DEXTROSE 50 % IV SOLN
12.5000 g | Freq: Once | INTRAVENOUS | Status: AC
  Administered 2015-05-12: 25 g via INTRAVENOUS

## 2015-05-12 MED ORDER — PROTAMINE SULFATE 10 MG/ML IV SOLN
INTRAVENOUS | Status: DC | PRN
  Administered 2015-05-12: 25 mg via INTRAVENOUS

## 2015-05-12 MED ORDER — LIDOCAINE-EPINEPHRINE (PF) 1 %-1:200000 IJ SOLN
INTRAMUSCULAR | Status: AC
  Filled 2015-05-12: qty 30

## 2015-05-12 MED ORDER — FENTANYL CITRATE (PF) 250 MCG/5ML IJ SOLN
INTRAMUSCULAR | Status: AC
  Filled 2015-05-12: qty 5

## 2015-05-12 MED ORDER — HEPARIN SODIUM (PORCINE) 5000 UNIT/ML IJ SOLN
INTRAMUSCULAR | Status: DC | PRN
  Administered 2015-05-12: 12:00:00

## 2015-05-12 MED ORDER — LIDOCAINE-EPINEPHRINE (PF) 1 %-1:200000 IJ SOLN
INTRAMUSCULAR | Status: DC | PRN
  Administered 2015-05-12: 30 mL

## 2015-05-12 MED ORDER — FENTANYL CITRATE (PF) 100 MCG/2ML IJ SOLN: 25.0000 ug | INTRAMUSCULAR | Status: DC | PRN

## 2015-05-12 MED ORDER — HYDROCODONE-ACETAMINOPHEN 10-325 MG PO TABS: 1.0000 | ORAL_TABLET | Freq: Four times a day (QID) | ORAL | Status: DC | PRN

## 2015-05-12 MED ORDER — ONDANSETRON HCL 4 MG/2ML IJ SOLN: 4.0000 mg | Freq: Four times a day (QID) | INTRAMUSCULAR | Status: DC | PRN

## 2015-05-12 MED ORDER — 0.9 % SODIUM CHLORIDE (POUR BTL) OPTIME
TOPICAL | Status: DC | PRN
  Administered 2015-05-12: 1000 mL

## 2015-05-12 MED ORDER — PHENYLEPHRINE HCL 10 MG/ML IJ SOLN
INTRAMUSCULAR | Status: DC | PRN
  Administered 2015-05-12 (×2): 80 ug via INTRAVENOUS

## 2015-05-12 MED ORDER — MIDAZOLAM HCL 5 MG/5ML IJ SOLN
INTRAMUSCULAR | Status: DC | PRN
  Administered 2015-05-12: 2 mg via INTRAVENOUS

## 2015-05-12 MED ORDER — ONDANSETRON HCL 4 MG/2ML IJ SOLN
INTRAMUSCULAR | Status: DC | PRN
  Administered 2015-05-12: 4 mg via INTRAVENOUS

## 2015-05-12 MED ORDER — FENTANYL CITRATE (PF) 100 MCG/2ML IJ SOLN
INTRAMUSCULAR | Status: DC | PRN
  Administered 2015-05-12: 25 ug via INTRAVENOUS
  Administered 2015-05-12: 50 ug via INTRAVENOUS

## 2015-05-12 MED ORDER — OXYCODONE HCL 5 MG PO TABS: 5.0000 mg | ORAL_TABLET | Freq: Once | ORAL | Status: DC | PRN

## 2015-05-12 MED ORDER — PROPOFOL 10 MG/ML IV BOLUS: INTRAVENOUS | Status: DC | PRN

## 2015-05-12 SURGICAL SUPPLY — 35 items
ARMBAND PINK RESTRICT EXTREMIT (MISCELLANEOUS) ×3 IMPLANT
CANISTER SUCTION 2500CC (MISCELLANEOUS) ×3 IMPLANT
CLIP TI MEDIUM 6 (CLIP) ×3 IMPLANT
CLIP TI WIDE RED SMALL 6 (CLIP) ×9 IMPLANT
COVER PROBE W GEL 5X96 (DRAPES) ×3 IMPLANT
ELECT REM PT RETURN 9FT ADLT (ELECTROSURGICAL) ×3
ELECTRODE REM PT RTRN 9FT ADLT (ELECTROSURGICAL) ×1 IMPLANT
GLOVE BIO SURGEON STRL SZ 6.5 (GLOVE) ×4 IMPLANT
GLOVE BIO SURGEON STRL SZ7.5 (GLOVE) ×3 IMPLANT
GLOVE BIO SURGEONS STRL SZ 6.5 (GLOVE) ×2
GLOVE BIOGEL PI IND STRL 6.5 (GLOVE) ×1 IMPLANT
GLOVE BIOGEL PI IND STRL 7.5 (GLOVE) ×1 IMPLANT
GLOVE BIOGEL PI IND STRL 8 (GLOVE) ×1 IMPLANT
GLOVE BIOGEL PI INDICATOR 6.5 (GLOVE) ×2
GLOVE BIOGEL PI INDICATOR 7.5 (GLOVE) ×2
GLOVE BIOGEL PI INDICATOR 8 (GLOVE) ×2
GLOVE ECLIPSE 6.5 STRL STRAW (GLOVE) ×3 IMPLANT
GLOVE SURG SS PI 7.5 STRL IVOR (GLOVE) ×3 IMPLANT
GOWN STRL REUS W/ TWL LRG LVL3 (GOWN DISPOSABLE) ×2 IMPLANT
GOWN STRL REUS W/ TWL XL LVL3 (GOWN DISPOSABLE) ×2 IMPLANT
GOWN STRL REUS W/TWL LRG LVL3 (GOWN DISPOSABLE) ×4
GOWN STRL REUS W/TWL XL LVL3 (GOWN DISPOSABLE) ×4
HEMOSTAT SNOW SURGICEL 2X4 (HEMOSTASIS) IMPLANT
KIT BASIN OR (CUSTOM PROCEDURE TRAY) ×3 IMPLANT
KIT ROOM TURNOVER OR (KITS) ×3 IMPLANT
LIQUID BAND (GAUZE/BANDAGES/DRESSINGS) ×3 IMPLANT
NS IRRIG 1000ML POUR BTL (IV SOLUTION) ×3 IMPLANT
PACK CV ACCESS (CUSTOM PROCEDURE TRAY) ×3 IMPLANT
PAD ARMBOARD 7.5X6 YLW CONV (MISCELLANEOUS) ×6 IMPLANT
SUT PROLENE 6 0 CC (SUTURE) ×3 IMPLANT
SUT VIC AB 3-0 SH 27 (SUTURE) ×2
SUT VIC AB 3-0 SH 27X BRD (SUTURE) ×1 IMPLANT
SUT VICRYL 4-0 PS2 18IN ABS (SUTURE) IMPLANT
UNDERPAD 30X30 INCONTINENT (UNDERPADS AND DIAPERS) ×3 IMPLANT
WATER STERILE IRR 1000ML POUR (IV SOLUTION) ×3 IMPLANT

## 2015-05-12 NOTE — Op Note (Signed)
    Patient name: Tami Elliott MRN: [id number] DOB: 07-03-1951 Sex: female  05/12/2015 Pre-operative Diagnosis: ESRD Post-operative diagnosis:  Same Surgeon:  [name] Assistants:  Leontine Locket Procedure:   Left Radio-cephalic AV fistula Anesthesia:  MAC converted to General Blood Loss:  See anesthesia record Specimens:  none  Findings:  59mm vein, 2.5 mm artery  Indications:  64 yo female with CKD here for AVF  Procedure:  The patient was identified in the holding area and taken to [place] [facility]  The patient was then placed supine on the table. general anesthesia was administered.  The patient was prepped and draped in the usual sterile fashion.  A time out was called and antibiotics were administered.  The cephalic vein evaluated with ultrasound and found to be adequate.  1% lidcaine was used.  A longitudinal incision was made just proximal to the wrist.  The cephalic vein was dissected out.  It measured 3 mm.  Multiple branches were ligated.  The vein was dissected within the incision.  Next, the artery was dissected free.  It was free.  It was 2.5 mm without significant atherosclerotic disease.  Next, the vein was ligated distally after marking it with an ink pen for orientation.  It distended nicely with heparinized saline.  3007 were given.  After the heparin circulated, the artery was occluded with vascular clamps.  A #11 blade was used to make an arteriotomy which was extended longitudinally with Potts scissors.  The vein was cut to the appropriate length and then spatulated to fit the size of the arteriotomy.  A running anastomosis was created with 6-0 Prolene.  Prior to completion, the appropriate flushing maneuvers were performed and the anastomosis was completed.  I then dissected out the cephalic vein going up towards the elbow making sure all branches were ligated and that there were no kinks.  There was excellent thrill within the fistula as well as adequate Doppler signal  distal to the fistula.  25 mg of protamine was given.  Once hemostasis was satisfactory, the incision was closed with 2 layers of 30 Vicryls followed by Dermabond.  There were no complications.   Disposition:  To PACU in stable condition.   Theotis Burrow, M.D. [facility] Office: [phone] Pager:  [phone]

## 2015-05-12 NOTE — Interval H&P Note (Signed)
History and Physical Interval Note:  05/12/2015 11:49 AM  Tami Elliott  has presented today for surgery, with the diagnosis of Stage IV Chronic Kidney Disease N18.4  The various methods of treatment have been discussed with the patient and family. After consideration of risks, benefits and other options for treatment, the patient has consented to  Procedure(s): ARTERIOVENOUS (AV) FISTULA CREATION (Left) as a surgical intervention .  The patient's history has been reviewed, patient examined, no change in status, stable for surgery.  I have reviewed the patient's chart and labs.  Questions were answered to the patient's satisfaction.     [name]

## 2015-05-12 NOTE — Discharge Instructions (Signed)
° ° °  05/12/2015 TWANISHA FOULK [id number] [birth year]  Surgeon(s): Serafina Mitchell, MD  Procedure(s): Radial cephalic ARTERIOVENOUS (AV) FISTULA CREATION  x Do not stick fistula for 12 weeks

## 2015-05-12 NOTE — Anesthesia Procedure Notes (Signed)
Procedure Name: LMA Insertion Date/Time: 05/12/2015 12:33 PM Performed by: Clearnce Sorrel Pre-anesthesia Checklist: Patient identified, Emergency Drugs available, Timeout performed, Suction available and Patient being monitored Patient Re-evaluated:Patient Re-evaluated prior to inductionOxygen Delivery Method: Circle system utilized Preoxygenation: Pre-oxygenation with 100% oxygen Intubation Type: IV induction LMA: LMA inserted LMA Size: 4.0 Placement Confirmation: positive ETCO2 and breath sounds checked- equal and bilateral Tube secured with: Tape Dental Injury: Teeth and Oropharynx as per pre-operative assessment

## 2015-05-12 NOTE — Anesthesia Postprocedure Evaluation (Signed)
  Anesthesia Post-op Note  Patient: Tami Elliott  Procedure(s) Performed: Procedure(s): ARTERIOVENOUS (AV) FISTULA CREATION (Left)  Patient Location: PACU  Anesthesia Type:General  Level of Consciousness: awake and alert   Airway and Oxygen Therapy: Patient Spontanous Breathing  Post-op Pain: none  Post-op Assessment: Post-op Vital signs reviewed              Post-op Vital Signs: Reviewed  Last Vitals:  Filed Vitals:   05/12/15 1500  BP: 141/75  Pulse: 73  Temp: 37.1 C  Resp: 15    Complications: No apparent anesthesia complications

## 2015-05-12 NOTE — Transfer of Care (Signed)
Immediate Anesthesia Transfer of Care Note  Patient: Tami Elliott  Procedure(s) Performed: Procedure(s): ARTERIOVENOUS (AV) FISTULA CREATION (Left)  Patient Location: PACU  Anesthesia Type:MAC and General  Level of Consciousness: awake, alert  and oriented  Airway & Oxygen Therapy: Patient Spontanous Breathing and Patient connected to face mask oxygen  Post-op Assessment: Report given to RN and Post -op Vital signs reviewed and stable  Post vital signs: Reviewed and stable  Last Vitals:  Filed Vitals:   05/12/15 0905  BP: 174/74  Pulse: 74  Temp: 36.4 C  Resp: 18    Complications: No apparent anesthesia complications

## 2015-05-12 NOTE — Anesthesia Preprocedure Evaluation (Addendum)
Anesthesia Evaluation  Patient identified by MRN, date of birth, ID band Patient awake    Reviewed: Allergy & Precautions, NPO status , Patient's Chart, lab work & pertinent test results, reviewed documented beta blocker date and time   Airway Mallampati: II   Neck ROM: full    Dental   Pulmonary sleep apnea ,    breath sounds clear to auscultation       Cardiovascular hypertension, Pt. on medications and Pt. on home beta blockers  Rhythm:regular Rate:Normal     Neuro/Psych PSYCHIATRIC DISORDERS Depression negative neurological ROS     GI/Hepatic GERD  ,  Endo/Other  diabetes, Type 2Hypothyroidism Morbid obesity  Renal/GU Renal InsufficiencyRenal disease     Musculoskeletal  (+) Arthritis ,   Abdominal   Peds  Hematology  (+) anemia ,   Anesthesia Other Findings   Reproductive/Obstetrics                            Anesthesia Physical Anesthesia Plan  ASA: III  Anesthesia Plan: MAC   Post-op Pain Management:    Induction: Intravenous  Airway Management Planned: Simple Face Mask  Additional Equipment:   Intra-op Plan:   Post-operative Plan:   Informed Consent: I have reviewed the patients History and Physical, chart, labs and discussed the procedure including the risks, benefits and alternatives for the proposed anesthesia with the patient or authorized representative who has indicated his/her understanding and acceptance.     Plan Discussed with: CRNA, Anesthesiologist and Surgeon  Anesthesia Plan Comments:         Anesthesia Quick Evaluation

## 2015-05-12 NOTE — H&P (View-Only) (Signed)
Patient name: Tami Elliott MRN: [id number] DOB: Jan 04, 1951 Sex: female   Referred by: Dr. Posey Pronto  Reason for referral:  Chief Complaint  Patient presents with  . New Evaluation    eval for HD access     HISTORY OF PRESENT ILLNESS: This is a 64 year old female who comes in today for evaluation of dialysis access.  Her kidney failure secondary to diabetes and hypertension.  She is right handed.  She is not yet on dialysis.  Patient suffers from diabetes, but this is been well controlled.  She states her last hemoglobin A1c was 5.9.  She is medically managed for hypertension with an ACE inhibitor.  Her blood pressures have fluctuated recently.  She has anemia of chronic disease and secondary hyperparathyroidism, secondary to her kidney disease.  She is a nonsmoker.  Past Medical History  Diagnosis Date  . Diabetes mellitus   . Hypertension   . Hypothyroidism   . Cataract     Left eye( Dr. Katy Fitch, patient uninsured so she can't  get  an intervention)  . Chronic kidney disease     nephrolithiasis, s/p removal 12/05/07  . Back pain     myofascial, excacerberated in 6-7/09, 9/09- required narcotics at those times  . Elbow fracture, right   . Obesity   . Depression     better on nortriptyline  . Hyperplastic colon polyp 2001    f/u colonoscopy 11/23/06 was normal( Dr. Carlean Purl), falls into normal risk screening( next colonoscopy due in 2018)  . Pap smear for cervical cancer screening     12/14/2005: normal, evidence of candida. 05/16/2007: normal, benign reperative changes.  . Anemia     Past Surgical History  Procedure Laterality Date  . Cataract extraction Left     Social History   Social History  . Marital Status: Married    Spouse Name: N/A  . Number of Children: N/A  . Years of Education: N/A   Occupational History  . Not on file.   Social History Main Topics  . Smoking status: Never Smoker   . Smokeless tobacco: Never Used  . Alcohol Use: No  . Drug Use:  No  . Sexual Activity: Not on file   Other Topics Concern  . Not on file   Social History Narrative   Married( 2nd marriage). Husband awaiting back surgery. BE AWARE THAT HUSBAND BROKE PAIN CONTRACT WITH [facility] SEVERAL TIMES!   Patient unemployed. Difficulty finding a jobb/c she is partially blind( but not blind enough to have disability). She used to work in [employer].. The grandson's mother basically   Son 71 y/o , unemployed and grandson lives with her. The grandson's mother basically dropped the child off when he was 35 y/o. They rarely hear from her They moved to an apartment from their house in 12/2008.    Family History  Problem Relation Age of Onset  . Diabetes Mother   . Heart disease Mother     before age 75  . Hypertension Mother     Allergies as of 04/27/2015  . (No Known Allergies)    Current Outpatient Prescriptions on File Prior to Visit  Medication Sig Dispense Refill  . aspirin 81 MG tablet Take 81 mg by mouth daily.      . DULoxetine (CYMBALTA) 30 MG capsule Take 30 mg by mouth every morning.     . DULoxetine (CYMBALTA) 60 MG capsule Take 60 mg by mouth every evening.      Marland Kitchen  insulin glargine (LANTUS) 100 UNIT/ML injection Inject 64 Units into the skin at bedtime.     Marland Kitchen levothyroxine (SYNTHROID, LEVOTHROID) 150 MCG tablet Take 150 mcg by mouth daily.      . ranitidine (ZANTAC) 300 MG tablet Take 300 mg by mouth daily as needed for heartburn.    Marland Kitchen HYDROcodone-acetaminophen (NORCO/VICODIN) 5-325 MG per tablet Take 2 tablets by mouth every 4 (four) hours as needed for pain. (Patient not taking: Reported on 04/27/2015) 20 tablet 0  . Liraglutide (VICTOZA) 18 MG/3ML SOLN Inject 1.2 mg into the skin daily.     Marland Kitchen lisinopril (PRINIVIL,ZESTRIL) 10 MG tablet Take 10 mg by mouth daily.    . metoprolol (LOPRESSOR) 50 MG tablet Take 1 tablet (50 mg total) by mouth daily. (Patient taking differently: Take 50 mg by mouth 2 (two) times daily. ) 30 tablet 0   No  current facility-administered medications on file prior to visit.     REVIEW OF SYSTEMS: Cardiovascular: No chest pain, chest pressure, palpitations, positive for shortness of breath with exertion and pain in her feet when lying flat Pulmonary: No productive cough, asthma or wheezing. Neurologic: No weakness, paresthesias, aphasia, or amaurosis. No dizziness. Hematologic: No bleeding problems or clotting disorders. Musculoskeletal: No joint pain or joint swelling. Gastrointestinal: No blood in stool or hematemesis Genitourinary: No dysuria or hematuria. Psychiatric:: No history of major depression. Integumentary: No rashes or ulcers. Constitutional: No fever or chills.  PHYSICAL EXAMINATION:  Filed Vitals:   04/27/15 1229 04/27/15 1232  BP: 150/78 151/75  Pulse: 72   Temp: 98.1 F (36.7 C)   TempSrc: Oral   Resp: 18   Height: 5\' 4"  (1.626 m)   Weight: 269 lb 14.4 oz (122.426 kg)   SpO2: 91%    Body mass index is 46.31 kg/(m^2). General: The patient appears their stated age.   HEENT:  No gross abnormalities Pulmonary: Respirations are non-labored Musculoskeletal: There are no major deformities.   Neurologic: No focal weakness or paresthesias are detected, Skin: There are no ulcer or rashes noted. Psychiatric: The patient has normal affect. Cardiovascular: There is a regular rate and rhythm without significant murmur appreciated.  Palpable radial pulse  Diagnostic Studies: I have reviewed her vein mapping which shows an adequate cephalic vein on the left.  She also has triphasic arterial waveforms   Assessment:  Chronic renal insufficiency, stage IV Plan: I discussed proceeding with a left radiocephalic fistula.  We discussed the risks and benefits of the operation including the risk of steal syndrome, and the risk of non-maturity requiring future additional interventions.  All of her questions were answered.  She is been scheduled for Friday, September 23     V.  Leia Alf, M.D. [facility] Office: [phone] Pager:  [phone]

## 2015-05-13 ENCOUNTER — Encounter (HOSPITAL_COMMUNITY): Payer: Self-pay | Admitting: Surgery

## 2015-05-13 ENCOUNTER — Telehealth: Payer: Self-pay | Admitting: Surgery

## 2015-05-13 NOTE — Telephone Encounter (Signed)
-----   Message from Mena Goes, RN sent at 05/12/2015  1:24 PM EDT ----- Regarding: Schedule   ----- Message -----    From: Serafina Mitchell, MD    Sent: 05/12/2015   1:19 PM      To: [facility] Charge Pool  05/12/2015:  Surgeon:  [name] Assistants:  Leontine Locket Procedure:   Left Radio-cephalic AV fistula  F/u 6 weeks with duplex of av fistula

## 2015-05-13 NOTE — Telephone Encounter (Signed)
LM fr pt re appt, dpm °

## 2015-05-15 ENCOUNTER — Telehealth: Payer: Self-pay

## 2015-05-15 NOTE — Telephone Encounter (Signed)
Phone call from pt.  Reported bruising under the chin, from ear to ear.  Stated the bruising is tender to touch, but denied swelling or firmness.  Denied any difficulty swallowing.  Denied any dyspnea.   Reported left forearm incision is intact; no drainage or redness.  Reported the tips of the thumb, index, and middle finger of (L) hand are numb.  Encouraged to do gentle ROM with fingers, and to hang arm/hand downward, if numbness or tingling worsen in the fingers.  Advised to report worsening of symptoms.  Verb. Understanding.

## 2015-06-16 ENCOUNTER — Encounter: Payer: Self-pay | Admitting: *Deleted

## 2015-06-16 ENCOUNTER — Ambulatory Visit (HOSPITAL_BASED_OUTPATIENT_CLINIC_OR_DEPARTMENT_OTHER): Payer: Medicare Other | Admitting: Hematology and Oncology

## 2015-06-16 ENCOUNTER — Encounter: Payer: Self-pay | Admitting: Hematology and Oncology

## 2015-06-16 ENCOUNTER — Telehealth: Payer: Self-pay | Admitting: Hematology and Oncology

## 2015-06-16 VITALS — BP 140/72 | HR 78 | Temp 97.6°F | Resp 20 | Ht 64.0 in | Wt 254.8 lb

## 2015-06-16 DIAGNOSIS — Z794 Long term (current) use of insulin: Secondary | ICD-10-CM | POA: Diagnosis not present

## 2015-06-16 DIAGNOSIS — C029 Malignant neoplasm of tongue, unspecified: Secondary | ICD-10-CM | POA: Diagnosis present

## 2015-06-16 DIAGNOSIS — N184 Chronic kidney disease, stage 4 (severe): Secondary | ICD-10-CM | POA: Diagnosis not present

## 2015-06-16 DIAGNOSIS — E1140 Type 2 diabetes mellitus with diabetic neuropathy, unspecified: Secondary | ICD-10-CM

## 2015-06-16 NOTE — Assessment & Plan Note (Signed)
she will continue current medical management. I recommend close follow-up with primary care doctor for medication adjustment.  

## 2015-06-16 NOTE — Telephone Encounter (Signed)
Pt aware of np appt. On 06/16/15@11 :00

## 2015-06-16 NOTE — Assessment & Plan Note (Signed)
We discussed the importance of staging. I will request the nurse Navigator to add p16 testing on recent tongue biopsy. I will get PET/CT scan for staging. We discussed the importance of multidisciplinary approach. I will refer her to see radiation oncologist, speech and language therapist for formal swallow assessment and dental evaluation. If PET scan show disease confined to the oropharynx, she may be a candidate for primary resection followed by adjuvant radiation therapy without the need for systemic treatment.

## 2015-06-16 NOTE — Assessment & Plan Note (Signed)
She had recent placement of AV fistula in preparation for possible hemodialysis in the future. Hopefully, the patient would not require chemotherapy in the near future.

## 2015-06-16 NOTE — Progress Notes (Signed)
  Oncology Nurse Navigator Documentation Referral date to RadOnc/MedOnc: 06/15/15 (06/16/15 1110) Navigator Encounter Type: Initial MedOnc (06/16/15 1110) Patient Visit Type: Medonc (06/16/15 1110)   Barriers/Navigation Needs: No barriers at this time (06/16/15 1110)   Interventions: None required (06/16/15 1110)     Met with patient during initial consult with Dr. Alvy Bimler.   Introduced myself as her Navigator, explained my role as a member of the Care Team. Provided New Patient Information packet:  Contact information for physician, this navigator, other members of the Care Team  Advance Directive information ([place] blue pamphlet with LCSW insert)  Fall Prevention Patient Safety Plan  Appointment Guideline  Financial Assistance Information sheet  ACS Referral Form  [facility] campus map with highlight of [place] She verbalized understanding of information provided. I encouraged her to call with questions/concerns, she verbalized understanding.  Gayleen Orem, RN, BSN, [place] at [place] [phone]           Time Spent with Patient: 60 (06/16/15 1110)

## 2015-06-16 NOTE — Progress Notes (Signed)
[facility] CONSULT NOTE  Patient Care Team: Harvie Junior, MD as PCP - General (Specialist) Elmarie Shiley, MD as Consulting Physician (Nephrology) Heath Lark, MD as Consulting Physician (Hematology and Oncology) Leta Baptist, MD as Consulting Physician (Otolaryngology)  CHIEF COMPLAINTS/PURPOSE OF CONSULTATION:   squamous cell carcinoma of the tongue  HISTORY OF PRESENTING ILLNESS:  Tami Elliott 64 y.o. female is here because of newly diagnosed recent diagnosis of squamous cell carcinoma of the tongue According to the patient, the first initial presentation was due to self palpated lump and discomfort on the left side of the neck. Around August 2016, she felt some pressure sensation over the left at the epitrochlear region along with a soreness on the anterior left side of the tongue and a tickle sensation on the left side of her throat. She was prescribed a course of antibiotics with no improvement. She went back to see her primary care doctor who prescribed Magic mouthwash but the patient did not try it and instead requested referral to see ENT. She underwent a tongue biopsy at the ENT office and was diagnosed squamous cell carcinoma. She was hence referred here. The patient has multiple other co-morbidities including poorly controlled diabetes complicating neuropathy and renal failure. She also has history of recurrent urinary tract infection and kidney stones in the past causing further kidney damage. Recently, she was diagnosed with chronic kidney disease stage IV and had AV fistula placed over the left arm 3 months ago in preparation for possible need for hemodialysis in the future.  she denies any hearing deficit, difficulties with chewing food, swallowing difficulties, changes in the quality of her voice or abnormal weight loss.  she does have some sensation of sore throat when she swallow.  MEDICAL HISTORY:  Past Medical History  Diagnosis Date  . Diabetes  mellitus   . Hypertension   . Hypothyroidism   . Cataract     Left eye( Dr. Katy Fitch, patient uninsured so she can't  get  an intervention)  . Chronic kidney disease     nephrolithiasis, s/p removal 12/05/07  . Back pain     myofascial, excacerberated in 6-7/09, 9/09- required narcotics at those times  . Elbow fracture, right   . Obesity   . Depression     better on nortriptyline  . Hyperplastic colon polyp 2001    f/u colonoscopy 11/23/06 was normal( Dr. Carlean Purl), falls into normal risk screening( next colonoscopy due in 2018)  . Pap smear for cervical cancer screening     12/14/2005: normal, evidence of candida. 05/16/2007: normal, benign reperative changes.  . Anemia   . Sleep apnea   . Pneumonia     as a child  . GERD (gastroesophageal reflux disease)   . Arthritis   . Cancer ([place])     Tongue ca  . Tongue cancer ([place]) 06/16/2015    SURGICAL HISTORY: Past Surgical History  Procedure Laterality Date  . Cataract extraction Left   . Lithotripsy    . Dilation and curettage of uterus    . Av fistula placement Left 05/12/2015    Procedure: ARTERIOVENOUS (AV) FISTULA CREATION;  Surgeon: Serafina Mitchell, MD;  Location: [facility];  Service: Vascular;  Laterality: Left;    SOCIAL HISTORY: Social History   Social History  . Marital Status: Married    Spouse Name: N/A  . Number of Children: N/A  . Years of Education: N/A   Occupational History  . Not on file.   Social  History Main Topics  . Smoking status: Never Smoker   . Smokeless tobacco: Never Used  . Alcohol Use: No  . Drug Use: No  . Sexual Activity: Not on file   Other Topics Concern  . Not on file   Social History Narrative   Married( 2nd marriage). Husband awaiting back surgery. BE AWARE THAT HUSBAND BROKE PAIN CONTRACT WITH [facility] SEVERAL TIMES!   Patient unemployed. Difficulty finding a jobb/c she is partially blind( but not blind enough to have disability). She used to work in [employer].. The  grandson's mother basically   Son 49 y/o , unemployed and grandson lives with her. The grandson's mother basically dropped the child off when he was 4 y/o. They rarely hear from her They moved to an apartment from their house in 12/2008.    FAMILY HISTORY: Family History  Problem Relation Age of Onset  . Diabetes Mother   . Heart disease Mother     before age 56  . Hypertension Mother     ALLERGIES:  has No Known Allergies.  MEDICATIONS:  Current Outpatient Prescriptions  Medication Sig Dispense Refill  . amLODipine (NORVASC) 10 MG tablet TAKE 1 TABLET BY MOUTH DAILY  12  . aspirin 81 MG tablet Take 81 mg by mouth daily.      . cetirizine (ZYRTEC) 10 MG tablet Take 10 mg by mouth daily as needed for allergies.   5  . Cholecalciferol (VITAMIN D3) 2000 UNITS TABS Take 1 capsule by mouth daily.    . Dulaglutide (TRULICITY) 1.5 IZ/1.2WP SOPN Inject 1.5 Units into the skin once a week.    . DULoxetine (CYMBALTA) 30 MG capsule Take 30 mg by mouth every morning.     . DULoxetine (CYMBALTA) 60 MG capsule Take 60 mg by mouth every evening.      . ferrous sulfate 325 (65 FE) MG tablet Take 325 mg by mouth daily with breakfast.    . FLUTICASONE PROPIONATE, INHAL, IN Inhale 1 spray into the lungs daily as needed (allergies).     . insulin glargine (LANTUS) 100 UNIT/ML injection Inject 64 Units into the skin at bedtime.     Marland Kitchen levothyroxine (SYNTHROID, LEVOTHROID) 150 MCG tablet Take 150 mcg by mouth daily.      . metoprolol (LOPRESSOR) 50 MG tablet Take 50 mg by mouth 2 (two) times daily.    Marland Kitchen oxyCODONE-acetaminophen (PERCOCET/ROXICET) 5-325 MG tablet TK 1 T PO Q 6 H PRN P  0  . ranitidine (ZANTAC) 300 MG tablet Take 300 mg by mouth daily as needed for heartburn.    . vitamin B-12 (CYANOCOBALAMIN) 100 MCG tablet Take 100 mcg by mouth daily.    . vitamin C (ASCORBIC ACID) 500 MG tablet Take 500 mg by mouth daily.    Marland Kitchen HYDROcodone-acetaminophen (NORCO) 10-325 MG tablet Take 1 tablet by mouth every  6 (six) hours as needed. (Patient not taking: Reported on 06/16/2015) 20 tablet 0   No current facility-administered medications for this visit.    REVIEW OF SYSTEMS:   Constitutional: Denies fevers, chills or abnormal night sweats Eyes: Denies blurriness of vision, double vision or watery eyes Respiratory: Denies cough, dyspnea or wheezes Cardiovascular: Denies palpitation, chest discomfort or lower extremity swelling Gastrointestinal:  Denies nausea, heartburn or change in bowel habits Skin: Denies abnormal skin rashes Neurological:Denies numbness, tingling or new weaknesses Behavioral/Psych: Mood is stable, no new changes  All other systems were reviewed with the patient and are negative.  PHYSICAL EXAMINATION:  ECOG PERFORMANCE STATUS: 1 - Symptomatic but completely ambulatory  Filed Vitals:   06/16/15 1112  BP: 140/72  Pulse: 78  Temp: 97.6 F (36.4 C)  Resp: 20   Filed Weights   06/16/15 1112  Weight: 254 lb 12.8 oz (115.577 kg)    GENERAL:alert, no distress and comfortable. She is morbidly obese SKIN: skin color, texture, turgor are normal, no rashes or significant lesions EYES: normal, conjunctiva are pink and non-injected, sclera clear OROPHARYNX: there is an ulcerated mass on the left lateral border of her tongue. No bleeding is noted. NECK: supple, thyroid normal size, non-tender, without nodularity LYMPH:   There is palpable fullness over the left submandibular region with palpable lymphadenopathy. LUNGS: clear to auscultation and percussion with normal breathing effort HEART: regular rate & rhythm and no murmurs and no lower extremity edema ABDOMEN:abdomen soft, non-tender and normal bowel sounds Musculoskeletal:no cyanosis of digits and no clubbing  PSYCH: alert & oriented x 3 with fluent speech NEURO: no focal motor/sensory deficits  LABORATORY DATA:  I have reviewed the data as listed Lab Results  Component Value Date   WBC 10.1 01/06/2013   HGB 10.5*  05/12/2015   HCT 31.0* 05/12/2015   MCV 85.4 01/06/2013   PLT 269 01/06/2013   Lab Results  Component Value Date   NA 141 05/12/2015   K 4.8 05/12/2015   CL 100 01/06/2013   CO2 25 01/06/2013    ASSESSMENT Newly diagnosed squamous cell carcinoma of the Head & Neck, HPV N/A  PLAN:  Tongue cancer ([place])  We discussed the importance of staging. I will request the nurse Navigator to add p16 testing on recent tongue biopsy. I will get PET/CT scan for staging. We discussed the importance of multidisciplinary approach. I will refer her to see radiation oncologist, speech and language therapist for formal swallow assessment and dental evaluation. If PET scan show disease confined to the oropharynx, she may be a candidate for primary resection followed by adjuvant radiation therapy without the need for systemic treatment.   Type 2 diabetes mellitus with diabetic neuropathy, with long-term current use of insulin ([place]) she will continue current medical management. I recommend close follow-up with primary care doctor for medication adjustment.   Chronic kidney disease, stage IV (severe) (HCC)  She had recent placement of AV fistula in preparation for possible hemodialysis in the future. Hopefully, the patient would not require chemotherapy in the near future.     Orders Placed This Encounter  Procedures  . NM PET Image Initial (PI) Skull Base To Thigh    Standing Status: Future     Number of Occurrences:      Standing Expiration Date: 06/15/2016    Order Specific Question:  Reason for Exam (SYMPTOM  OR DIAGNOSIS REQUIRED)    Answer:  staging tongue ca    Order Specific Question:  Preferred imaging location?    Answer:  [facility]  . Ambulatory Referral to Radiation Oncology    Referral Priority:  Routine    Referral Type:  Consultation    Referral Reason:  Specialty Services Required    Requested Specialty:  Radiation Oncology    Number of Visits Requested:  1  .  Ambulatory Referral to Dentistry (specifically to Dr. Enrique Sack)    Referral Priority:  Routine    Referral Type:  Consultation    Referral Reason:  Specialty Services Required    Requested Specialty:  Dental General Practice    Number of Visits Requested:  1  .  Ambulatory Referral to Speech Therapy  (specifically to Garald Balding)    Referral Priority:  Routine    Referral Type:  Speech Therapy    Referral Reason:  Specialty Services Required    Requested Specialty:  Speech Pathology    Number of Visits Requested:  1  . Amb Referral to Nutrition and Diabetic Education (specifically to Ernestene Kiel)    Referral Priority:  Routine    Referral Type:  Consultation    Referral Reason:  Specialty Services Required    Number of Visits Requested:  1  . Ambulatory Referral to Social Work    Referral Priority:  Routine    Referral Type:  Consultation    Referral Reason:  Specialty Services Required    Number of Visits Requested:  1    All questions were answered. The patient knows to call the clinic with any problems, questions or concerns. I spent 55 minutes counseling the patient face to face. The total time spent in the appointment was 60 minutes and more than 50% was on counseling.     [facility], [place], MD 06/16/2015 3:18 PM

## 2015-06-18 ENCOUNTER — Telehealth: Payer: Self-pay | Admitting: *Deleted

## 2015-06-18 ENCOUNTER — Encounter: Payer: Self-pay | Admitting: Podiatry

## 2015-06-18 ENCOUNTER — Ambulatory Visit (INDEPENDENT_AMBULATORY_CARE_PROVIDER_SITE_OTHER): Payer: Medicare Other | Admitting: Podiatry

## 2015-06-18 DIAGNOSIS — B351 Tinea unguium: Secondary | ICD-10-CM | POA: Diagnosis not present

## 2015-06-18 DIAGNOSIS — M79676 Pain in unspecified toe(s): Secondary | ICD-10-CM

## 2015-06-18 DIAGNOSIS — E1149 Type 2 diabetes mellitus with other diabetic neurological complication: Secondary | ICD-10-CM

## 2015-06-18 DIAGNOSIS — E1140 Type 2 diabetes mellitus with diabetic neuropathy, unspecified: Secondary | ICD-10-CM | POA: Diagnosis not present

## 2015-06-18 DIAGNOSIS — Z794 Long term (current) use of insulin: Secondary | ICD-10-CM

## 2015-06-18 NOTE — Telephone Encounter (Signed)
  Oncology Nurse Navigator Documentation Referral date to RadOnc/MedOnc: 06/18/15 (06/18/15 1525) Navigator Encounter Type: Telephone (06/18/15 1525)         Interventions: Other (06/18/15 1525)     Spoke with Junie Panning at [facility], per Dr. Alvy Bimler, requested p16 stain for patient's 06/08/15 pathology.  Later received call from pathologist Dr. Lanae Crumbly who questioned request, said she would speak with Dr. Alvy Bimler tomorrow before ordering.  Gayleen Orem, RN, BSN, [place] at [place] [phone]           Time Spent with Patient: 15 (06/18/15 1525)

## 2015-06-18 NOTE — Progress Notes (Addendum)
Head and Neck Cancer Location of Tumor / Histology: Tongue Cancer, squamous cell   Patient originally presented to her primary care doctor because of a  self palpated lump and discomfort on the left side of the neck. Around August 2016, she felt some pressure sensation over the left at the epitrochlear region along with a soreness on the anterior left side of the tongue and a tickle sensation on the left side of her throat. She was prescribed a course of antibiotics with no improvement. She went back to see her primary care doctor who prescribed Magic mouthwash but the patient did not try it and instead requested referral to see ENT.  She underwent a tongue biopsy at the ENT office and was diagnosed squamous cell carcinoma.06/08/15    Biopsies of  (if applicable) revealed:   Nutrition Status Yes No Comments  Weight changes? []  [x]    Swallowing concerns? []  [x]    PEG? []  [x]     Referrals Yes No Comments  Social Work? []  [x]    Dentistry? [x]  []  06/19/15 Dr. Enrique Sack  Swallowing therapy? []  [x]    Nutrition? [x]  []  Ernestene Kiel 06/22/15  Med/Onc? [x]  []  Dr. Gorsuch11/1/16   Safety Issues Yes No Comments  Prior radiation? []  [x]    Pacemaker/ICD? []  [x]    Possible current pregnancy? []  [x]    Is the patient on methotrexate? []  [x]     Tobacco/Marijuana/Snuff/ETOH use: Patient denies   Past/Anticipated interventions by otolaryngology, if any: She saw Dr. Benjamine Mola on 06/08/15 and a flexible fiberoptic Laryngoscopy was performed and a biopsy was taken.   Past/Anticipated interventions by medical oncology, if any: Dr. Alvy Bimler saw 06/16/15. The plan is to add p16 testing on recent biopsy, PET/CT scans and if scans show disease confined to the oropharynx, she may be a surgery candidate followed by adjuvant radiation therapy.      Current Complaints / other details:  Married,partially blind,Fistula placement left arm 05/12/15, Dialysis 06/29/15;   Allergies:NKA Wt Readings from Last 3 Encounters:   06/22/15 256 lb 4.8 oz (116.257 kg)  06/16/15 254 lb 12.8 oz (115.577 kg)  04/27/15 269 lb 14.4 oz (122.426 kg)    BP 123/67 mmHg  Pulse 81  Temp(Src) 97.9 F (36.6 C) (Oral)  Resp 22  Ht 5\' 4"  (1.626 m)  Wt 256 lb 4.8 oz (116.257 kg)  BMI 43.97 kg/m2  SpO2 95%

## 2015-06-18 NOTE — Telephone Encounter (Addendum)
A user error has taken place: encounter opened in error, closed for administrative reasons.

## 2015-06-18 NOTE — Progress Notes (Signed)
Patient ID: Tami Elliott, female   DOB: 11/05/1950, 63 y.o.   MRN: [id number] Complaint:  Visit Type: Patient returns to my office for continued preventative foot care services. Complaint: Patient states" my nails have grown long and thick and become painful to walk and wear shoes" Patient has been diagnosed with DM with neuropathy.. The patient presents for preventative foot care services. No changes to ROS  Podiatric Exam: Vascular: dorsalis pedis and posterior tibial pulses are palpable bilateral. Capillary return is immediate. Temperature gradient is WNL. Skin turgor WNL  Sensorium: Diminished  Semmes Weinstein monofilament test. Normal tactile sensation bilaterally. Nail Exam: Pt has thick disfigured discolored nails with subungual debris noted bilateral entire nail hallux through fifth toenails Ulcer Exam: There is no evidence of ulcer or pre-ulcerative changes or infection. Orthopedic Exam: Muscle tone and strength are WNL. No limitations in general ROM. No crepitus or effusions noted. Foot type and digits show no abnormalities. Bony prominences are unremarkable. Skin: No Porokeratosis. No infection or ulcers  Diagnosis:  Onychomycosis, , Pain in right toe, pain in left toes, Diabetic neuropathy  Treatment & Plan Procedures and Treatment: Consent by patient was obtained for treatment procedures. The patient understood the discussion of treatment and procedures well. All questions were answered thoroughly reviewed. Debridement of mycotic and hypertrophic toenails, 1 through 5 bilateral and clearing of subungual debris. No ulceration, no infection noted.  Return Visit-Office Procedure: Patient instructed to return to the office for a follow up visit 3 months for continued evaluation and treatment. RTC 3 mos 

## 2015-06-19 ENCOUNTER — Ambulatory Visit (HOSPITAL_COMMUNITY): Payer: Medicaid - Dental | Admitting: Dentistry

## 2015-06-19 ENCOUNTER — Telehealth: Payer: Self-pay | Admitting: *Deleted

## 2015-06-19 ENCOUNTER — Encounter (HOSPITAL_COMMUNITY): Payer: Self-pay | Admitting: Dentistry

## 2015-06-19 VITALS — BP 149/79 | HR 67 | Temp 97.7°F

## 2015-06-19 DIAGNOSIS — K036 Deposits [accretions] on teeth: Secondary | ICD-10-CM

## 2015-06-19 DIAGNOSIS — K029 Dental caries, unspecified: Secondary | ICD-10-CM

## 2015-06-19 DIAGNOSIS — Z01818 Encounter for other preprocedural examination: Secondary | ICD-10-CM

## 2015-06-19 DIAGNOSIS — K060 Gingival recession: Secondary | ICD-10-CM

## 2015-06-19 DIAGNOSIS — K08409 Partial loss of teeth, unspecified cause, unspecified class: Secondary | ICD-10-CM

## 2015-06-19 DIAGNOSIS — IMO0002 Reserved for concepts with insufficient information to code with codable children: Secondary | ICD-10-CM

## 2015-06-19 DIAGNOSIS — K0889 Other specified disorders of teeth and supporting structures: Secondary | ICD-10-CM

## 2015-06-19 DIAGNOSIS — K0530 Chronic periodontitis, unspecified: Secondary | ICD-10-CM

## 2015-06-19 DIAGNOSIS — C029 Malignant neoplasm of tongue, unspecified: Secondary | ICD-10-CM

## 2015-06-19 DIAGNOSIS — K083 Retained dental root: Secondary | ICD-10-CM

## 2015-06-19 DIAGNOSIS — M264 Malocclusion, unspecified: Secondary | ICD-10-CM

## 2015-06-19 DIAGNOSIS — M2632 Excessive spacing of fully erupted teeth: Secondary | ICD-10-CM

## 2015-06-19 NOTE — Telephone Encounter (Signed)
  Oncology Nurse Navigator Documentation   Navigator Encounter Type: Telephone (06/19/15 4445)         Interventions: Other (06/19/15 8483)     Per Dr. Alvy Bimler, called [facility], spoke with Junie Panning, cancelled p16 stain request on patient's recent pathology.  Gayleen Orem, RN, BSN, [place] at [place] [phone]           Time Spent with Patient: 15 (06/19/15 0925)

## 2015-06-19 NOTE — Telephone Encounter (Signed)
OK to cancel test.

## 2015-06-19 NOTE — Progress Notes (Signed)
DENTAL CONSULTATION  Date of Consultation:  06/19/2015 Patient Name:   Tami Elliott Date of Birth:   09-14-1950 Medical Record Number: [id number]  VITALS: BP 149/79 mmHg  Pulse 67  Temp(Src) 97.7 F (36.5 C) (Oral)  CHIEF COMPLAINT: Patient referred by Dr. Alvy Bimler for dental consultation.  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 The patient had the tongue biopsy on 06/08/2015 by Dr. Benjamine Mola. Patient with anticipated surgical resection followed by radiation therapy and possible chemotherapy. Patient is now seen as part of a pre-chemoradiation therapy dental protocol examination  Patient currently denies acute toothaches, swellings, or abscesses. Patient is having significant tongue pain measured at 7 or 8 out of 10 in intensity. Patient indicates that it is"constant pain". Patient indicates that it is hard to eat, chew, and talk. Patient no longer has the hydrocodone pain medication given by Dr. Trula Slade and no longer has the oxycodone pain medication given by Dr.Teoh. Patient last saw a dentist approximately 2 years ago for an exam, cleaning, and dental extraction. Patient does not seek regular dental care since she lost her dental insurance. Patient previously saw Dr. Deatra Ina for many years until approximately 2004 or 2005.  Patient denies having any partial dentures.  PROBLEM LIST: Patient Active Problem List   Diagnosis Date Noted  . Tongue cancer ([place]) 06/16/2015  . Acute renal failure ([place]) 01/17/2012  . Hydronephrosis 01/17/2012  . Nephrolithiasis 01/17/2012  . BLISTER, FOOT 06/25/2009  . Type 2 diabetes mellitus with diabetic neuropathy, with long-term current use of insulin ([place]) 12/17/2008  . Chronic kidney disease, stage IV (severe) ([place]) 12/17/2008  . ALLERGIC RHINITIS, SEASONAL 12/15/2008  . OBESITY 04/07/2008  . DEPRESSIVE DISORDER 04/07/2008  . NEPHROLITHIASIS 11/02/2007  . UNSPECIFIED  DENTAL CARIES 09/05/2007  . HEARING LOSS, TINNITUS 05/16/2007  . BACK PAIN 05/01/2007  . HYPERLIPIDEMIA 10/26/2006  . PSORIASIS 10/26/2006  . HYPOTHYROIDISM 06/16/2006  . HYPERTENSION 06/16/2006    PMH: Past Medical History  Diagnosis Date  . Diabetes mellitus   . Hypertension   . Hypothyroidism   . Cataract     Left eye( Dr. Katy Fitch, patient uninsured so she can't  get  an intervention)  . Chronic kidney disease     nephrolithiasis, s/p removal 12/05/07  . Back pain     myofascial, excacerberated in 6-7/09, 9/09- required narcotics at those times  . Elbow fracture, right   . Obesity   . Depression     better on nortriptyline  . Hyperplastic colon polyp 2001    f/u colonoscopy 11/23/06 was normal( Dr. Carlean Purl), falls into normal risk screening( next colonoscopy due in 2018)  . Pap smear for cervical cancer screening     12/14/2005: normal, evidence of candida. 05/16/2007: normal, benign reperative changes.  . Anemia   . Sleep apnea   . Pneumonia     as a child  . GERD (gastroesophageal reflux disease)   . Arthritis   . Tongue cancer ([place]) 06/16/2015    SCCa of Left Lateral Tongue  . Nephrolithiasis 11/2007, 01/2012    PSH: Past Surgical History  Procedure Laterality Date  . Cataract extraction Left   . Lithotripsy  11/2007  . Dilation and curettage of uterus    . Av fistula placement Left 05/12/2015    Procedure: ARTERIOVENOUS (AV) FISTULA CREATION;  Surgeon: Serafina Mitchell, MD;  Location: [facility];  Service: Vascular;  Laterality: Left;    ALLERGIES: No Known Allergies  MEDICATIONS: Current Outpatient Prescriptions  Medication Sig Dispense Refill  . amLODipine (NORVASC) 10 MG tablet TAKE 1 TABLET BY MOUTH DAILY  12  . aspirin 81 MG tablet Take 81 mg by mouth daily.      . cetirizine (ZYRTEC) 10 MG tablet Take 10 mg by mouth daily as needed for allergies.   5  . Cholecalciferol (VITAMIN D3) 2000 UNITS TABS Take 1 capsule by mouth daily.    . Dulaglutide (TRULICITY) 1.5  HK/7.4QV SOPN Inject 1.5 Units into the skin once a week.    . DULoxetine (CYMBALTA) 30 MG capsule Take 30 mg by mouth every morning.     . DULoxetine (CYMBALTA) 60 MG capsule Take 60 mg by mouth every evening.      . ferrous sulfate 325 (65 FE) MG tablet Take 325 mg by mouth daily with breakfast.    . FLUTICASONE PROPIONATE, INHAL, IN Inhale 1 spray into the lungs daily as needed (allergies).     . insulin glargine (LANTUS) 100 UNIT/ML injection Inject 64 Units into the skin at bedtime.     Marland Kitchen levothyroxine (SYNTHROID, LEVOTHROID) 150 MCG tablet Take 150 mcg by mouth daily.      . metoprolol (LOPRESSOR) 50 MG tablet Take 50 mg by mouth 2 (two) times daily.    . ranitidine (ZANTAC) 300 MG tablet Take 300 mg by mouth daily as needed for heartburn.    . vitamin B-12 (CYANOCOBALAMIN) 100 MCG tablet Take 100 mcg by mouth daily.    . vitamin C (ASCORBIC ACID) 500 MG tablet Take 500 mg by mouth daily.    Marland Kitchen HYDROcodone-acetaminophen (NORCO) 10-325 MG tablet Take 1 tablet by mouth every 6 (six) hours as needed. (Patient not taking: Reported on 06/19/2015) 20 tablet 0  . oxyCODONE-acetaminophen (PERCOCET/ROXICET) 5-325 MG tablet TK 1 T PO Q 6 H PRN P  0   No current facility-administered medications for this visit.    LABS: Lab Results  Component Value Date   WBC 10.1 01/06/2013   HGB 10.5* 05/12/2015   HCT 31.0* 05/12/2015   MCV 85.4 01/06/2013   PLT 269 01/06/2013      Component Value Date/Time   NA 141 05/12/2015 0856   K 4.8 05/12/2015 0856   CL 100 01/06/2013 1402   CO2 25 01/06/2013 1402   GLUCOSE 85 05/12/2015 0856   BUN 34* 01/06/2013 1402   CREATININE 1.67* 01/06/2013 1402   CALCIUM 9.1 01/06/2013 1402   GFRNONAA 32* 01/06/2013 1402   GFRAA 37* 01/06/2013 1402   No results found for: INR, PROTIME No results found for: PTT  SOCIAL HISTORY: Social History   Social History  . Marital Status: Married    Spouse Name: N/A  . Number of Children: 2  . Years of Education: N/A    Occupational History  . Not on file.   Social History Main Topics  . Smoking status: Never Smoker   . Smokeless tobacco: Never Used  . Alcohol Use: No  . Drug Use: No  . Sexual Activity: Not on file   Other Topics Concern  . Not on file   Social History Narrative   Married( 2nd marriage). Husband awaiting back surgery. BE AWARE THAT HUSBAND BROKE PAIN CONTRACT WITH [facility] SEVERAL TIMES!   Patient unemployed. Difficulty finding a jobb/c she is partially blind( but not blind enough to have disability). She used to work in [employer].. The grandson's mother basically   Son 38 y/o , unemployed and grandson lives  with her. The grandson's mother basically dropped the child off when he was 85 y/o. They rarely hear from her They moved to an apartment from their house in 12/2008.    FAMILY HISTORY: Family History  Problem Relation Age of Onset  . Diabetes Mother   . Heart disease Mother     before age 63  . Hypertension Mother     REVIEW OF SYSTEMS: Reviewed with the patient included in dental record. Constitutional: Denies fevers, chills or abnormal night sweats Eyes: Denies blurriness of vision, double vision or watery eyes. Visual deficit is noted with the left eye. Respiratory: Denies cough, dyspnea or wheezes Cardiovascular: Denies palpitation, chest discomfort or lower extremity swelling Gastrointestinal: Denies nausea, heartburn or change in bowel habits Skin: Denies abnormal skin rashes Neurological:Denies numbness, tingling or new weaknesses Behavioral/Psych: History depression; Mood is stable, no new changes  All other systems were reviewed with the patient and are negative  DENTAL HISTORY: CHIEF COMPLAINT: Patient referred by Dr. Alvy Bimler for dental consultation.  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 The patient had the tongue biopsy on 06/08/2015 by Dr. Benjamine Mola. Patient  with anticipated surgical resection followed by radiation therapy and possible chemotherapy. Patient is now seen as part of a pre-chemoradiation therapy dental protocol examination  Patient currently denies acute toothaches, swellings, or abscesses. Patient is having significant tongue pain measured at 7 or 8 out of 10 in intensity. Patient indicates that it is"constant pain". Patient indicates that it is hard to eat, chew, and talk. Patient no longer has the hydrocodone pain medication given by Dr. Trula Slade and no longer has the oxycodone pain medication given by Dr.Teoh. Patient last saw a dentist approximately 2 years ago for an exam, cleaning, and dental extraction. Patient does not seek regular dental care since she lost her dental insurance. Patient previously saw Dr. Deatra Ina for many years until approximately 2004 or 2005.  Patient denies having any partial dentures.  DENTAL EXAMINATION: GENERAL: The patient is a well-developed, well-nourished female in no acute distress. HEAD AND NECK: There appears to be some left neck lymphadenopathy although difficult to palpate secondary to patient tenderness. The patient has no right neck lymphadenopathy. Patient denies acute TMJ symptoms. INTRAORAL EXAM: The patient has normal saliva. The left lateral tongue in the area of 17/18 is ulcerated consistent with squamous cell carcinoma diagnosis. The tongue is tender to palpation. There is no evidence of oral abscess formation. Patient has very poor oral hygiene. DENTITION: Patient is missing tooth numbers 1 through 5, 13, 14, 16, 17, 18, 20,30-32. There is a retained root segment in the area of #11. PERIODONTAL: Patient has chronic periodontitis with plaque and calculus accumulations, generalized gingival recession, and tooth mobility as charted. Periodontal charting was deferred secondary to gross accumulation of plaque and calculus. DENTAL CARIES/SUBOPTIMAL RESTORATIONS: Dental caries are noted as per dental  charting form. ENDODONTIC: Patient currently denies acute pulpitis symptoms. There is no evidence of periapical pathology. CROWN AND BRIDGE: Patient has a stainless steel crown on tooth #19. PROSTHODONTIC: Patient does not have partial dentures. OCCLUSION: Patient has a poor occlusal scheme secondary to multiple missing teeth, retained root segment, multiple diastemas, and supra-eruption and drifting of the unopposed teeth into the edentulous areas. Patient also has not had teeth replaced by partial dentures.  RADIOGRAPHIC INTERPRETATION: Orthopantogram was taken and supplemented with 8 periapical radiographs. Patient would not allow periapical radiograph of tooth #19 today secondary to  pain. There are multiple missing teeth. There is a retained root segment #11. There are dental caries noted. There is moderate to severe bone loss. There is supra-eruption and drifting of the unopposed teeth into the edentulous areas. There is no evidence of periapical pathology although there may be incipient radiolucency at the apex of tooth #11.  ASSESSMENTS: 1. Squamous cell carcinoma of the left lateral tongue 2. Pre-chemoradiation therapy dental protocol examination 3. Retained root segment #11 4. Dental caries 5. Chronic periodontitis with bone loss 6. Accretions 7. Gingival recession 8. Gross accumulation of plaque and calculus 9. Multiple missing teeth 10. Supra-eruption and drifting of the unopposed teeth into the edentulous areas 11. Multiple diastemas 12. Poor occlusal scheme and malocclusion  PLAN/RECOMMENDATIONS: 1. I discussed the risks, benefits, and complications of various treatment options with the patient in relationship to her medical and dental conditions, anticipated surgical resection, and anticipated radiation therapy and possible chemotherapy. We discussed various treatment options to include no treatment, multiple extractions with alveoloplasty, pre-prosthetic surgery as indicated,  periodontal therapy, dental restorations, root canal therapy, crown and bridge therapy, implant therapy, and replacement of missing teeth as indicated. The patient currently wishes to proceed with extraction of remaining teeth with alveoloplasty in the operating room with general anesthesia if patient is anticipated to receive future radiation therapy. The patient will then follow up with the dentist of her choice for fabrication of upper lower complete dentures after adequate healing.  2. Discussion of findings with medical team and coordination of future medical and dental care as needed. Patient is currently pending consultation with Dr. Isidore Moos to determine the overall need for radiation therapy. Further discussion will then take place to coordinate anticipated tongue resection and dental treatment as indicated. Alternatively, the patient may proceed with the dental surgery followed by definitive surgical resection of tongue.  I spent in excess of  120 minutes during the conduct of this consultation and >50% of this time involved direct face-to-face encounter for counseling and/or coordination of the patient's care.    Lenn Cal, DDS

## 2015-06-19 NOTE — Patient Instructions (Signed)

## 2015-06-22 ENCOUNTER — Encounter: Payer: Self-pay | Admitting: Radiation Oncology

## 2015-06-22 ENCOUNTER — Ambulatory Visit: Payer: Medicare Other | Admitting: Nutrition

## 2015-06-22 ENCOUNTER — Ambulatory Visit
Admission: RE | Admit: 2015-06-22 | Discharge: 2015-06-22 | Disposition: A | Discharge status: Home / Self Care | Payer: Medicare Other | Source: Ambulatory Visit | Attending: Radiation Oncology | Admitting: Radiation Oncology

## 2015-06-22 ENCOUNTER — Encounter: Payer: Self-pay | Admitting: *Deleted

## 2015-06-22 ENCOUNTER — Encounter: Payer: Medicare Other | Admitting: Nutrition

## 2015-06-22 VITALS — BP 123/67 | HR 81 | Temp 97.9°F | Resp 22 | Ht 64.0 in | Wt 256.3 lb

## 2015-06-22 DIAGNOSIS — E119 Type 2 diabetes mellitus without complications: Secondary | ICD-10-CM | POA: Insufficient documentation

## 2015-06-22 DIAGNOSIS — Z87442 Personal history of urinary calculi: Secondary | ICD-10-CM | POA: Diagnosis not present

## 2015-06-22 DIAGNOSIS — K219 Gastro-esophageal reflux disease without esophagitis: Secondary | ICD-10-CM | POA: Insufficient documentation

## 2015-06-22 DIAGNOSIS — C029 Malignant neoplasm of tongue, unspecified: Secondary | ICD-10-CM

## 2015-06-22 DIAGNOSIS — K635 Polyp of colon: Secondary | ICD-10-CM | POA: Insufficient documentation

## 2015-06-22 DIAGNOSIS — I10 Essential (primary) hypertension: Secondary | ICD-10-CM | POA: Diagnosis not present

## 2015-06-22 DIAGNOSIS — E039 Hypothyroidism, unspecified: Secondary | ICD-10-CM | POA: Insufficient documentation

## 2015-06-22 DIAGNOSIS — E669 Obesity, unspecified: Secondary | ICD-10-CM | POA: Diagnosis not present

## 2015-06-22 NOTE — Progress Notes (Signed)
Please see the Nurse Progress Note in the MD Initial Consult Encounter for this patient. 

## 2015-06-22 NOTE — Addendum Note (Signed)
Encounter addended by: Eppie Gibson, MD on: 06/22/2015  3:09 PM<BR>     Documentation filed: Follow-up Section, LOS Section

## 2015-06-22 NOTE — Progress Notes (Signed)
  Oncology Nurse Navigator Documentation   Navigator Encounter Type: Initial RadOnc (06/22/15 0805)     Barriers/Navigation Needs: Education (06/22/15 0805)     Met with patient during initial consult with Dr. Isidore Moos.    Provided introductory explanation of radiation treatment including SIM planning and purpose of Aquaplast head and shoulder mask, showed them example.   She verbalized understanding of Dr. Pearlie Oyster explanation that surgery best option for cure at this time, may be followed by RT if needed. She understands I can be contacted with needs/concerns.  Gayleen Orem, RN, BSN, [facility] [phone]               Time Spent with Patient: 60 (06/22/15 0805)

## 2015-06-22 NOTE — Progress Notes (Signed)
Radiation Oncology         [phone]) [phone] ________________________________  Initial outpatient Consultation  Name: Tami Elliott MRN: [id number]  Date: 06/22/2015  DOB: 1951-04-03  MH:[id number],GXQJJH M, MD  Heath Lark, MD  [facility] TEOH  REFERRING PHYSICIAN: Heath Lark, MD  DIAGNOSIS: [id number] Oral tongue Squamous Cell Carcinoma   ICD-9-CM ICD-10-CM   1. Tongue cancer (HCC) 141.9 C02.9     HISTORY OF PRESENT ILLNESS::Tami Elliott is a 64 y.o. female who presented with a neck tenderness (left) and left sided tongue/ear soreness. She was given ABX for this and magic mouthwash by her PCP.  Subsequently, the patient saw Dr. Benjamine Mola who performed biopsy.  Biopsy of Left lateral tongue on 06-08-15 revealed: Invasive ulcerated keratinzing squamous cell carcinoma.  Patient saw Dr Alvy Bimler and has many combordities including renal failure which make her a poor candidate for systemic therapy.  Surgical resection is pending.  Pertinent imaging thus far has not been done.  PET pending for 06/30/15.  She has seen Dr Enrique Sack DDS. Per his note, "The patient currently wishes to proceed with extraction of remaining teeth with alveoloplasty in the operating room with general anesthesia if patient is anticipated to receive future radiation therapy... Patient is currently pending consultation with Dr. Isidore Moos to determine the overall need for radiation therapy."  Swallowing issues, if any: none  Weight Changes: intentional 15 lb loss   Pain status: ear pain, throat pain, neck tender, left posterior tongue sore  Other symptoms: Unrelated - she has a new fistula in her left arm but hasn't yet started dialysis for renal failure. Was on renal transplant list.  Has DM as well.  Tobacco history, if any: none  ETOH abuse, if any: rare beer, no abuse  Prior cancers, if any: none   PREVIOUS RADIATION THERAPY: No  PAST MEDICAL HISTORY:  has a past medical history of Diabetes mellitus; Hypertension;  Hypothyroidism; Cataract; Chronic kidney disease; Back pain; Elbow fracture, right; Obesity; Depression; Hyperplastic colon polyp (2001); Pap smear for cervical cancer screening; Anemia; Sleep apnea; Pneumonia; GERD (gastroesophageal reflux disease); Arthritis; Tongue cancer ([place]) (06/16/2015); Nephrolithiasis (11/2007, 01/2012); and Cancer ([place]) (06/08/15 bx).    PAST SURGICAL HISTORY: Past Surgical History  Procedure Laterality Date  . Cataract extraction Left   . Lithotripsy  11/2007  . Dilation and curettage of uterus    . Av fistula placement Left 05/12/2015    Procedure: ARTERIOVENOUS (AV) FISTULA CREATION;  Surgeon: Serafina Mitchell, MD;  Location: [facility];  Service: Vascular;  Laterality: Left;  . Av fistula placement  05/12/15    left fore arm    FAMILY HISTORY: family history includes Diabetes in her mother; Heart disease in her mother; Hypertension in her mother.  SOCIAL HISTORY:  reports that she has never smoked. She has never used smokeless tobacco. She reports that she does not drink alcohol or use illicit drugs.  ALLERGIES: Review of patient's allergies indicates no known allergies.  MEDICATIONS:  Current Outpatient Prescriptions  Medication Sig Dispense Refill  . amLODipine (NORVASC) 10 MG tablet TAKE 1 TABLET BY MOUTH DAILY  12  . aspirin 81 MG tablet Take 81 mg by mouth daily.      . cetirizine (ZYRTEC) 10 MG tablet Take 10 mg by mouth daily as needed for allergies.   5  . Cholecalciferol (VITAMIN D3) 2000 UNITS TABS Take 1 capsule by mouth daily.    . Dulaglutide (TRULICITY) 1.5 ER/7.4YC SOPN Inject 1.5 Units into the skin once a week.    Marland Kitchen  DULoxetine (CYMBALTA) 30 MG capsule Take 30 mg by mouth every morning.     . DULoxetine (CYMBALTA) 60 MG capsule Take 60 mg by mouth every evening.      . ferrous sulfate 325 (65 FE) MG tablet Take 325 mg by mouth daily with breakfast.    . insulin glargine (LANTUS) 100 UNIT/ML injection Inject 64 Units into the skin at bedtime.     Marland Kitchen  levothyroxine (SYNTHROID, LEVOTHROID) 150 MCG tablet Take 150 mcg by mouth daily.      . metoprolol (LOPRESSOR) 50 MG tablet Take 50 mg by mouth 2 (two) times daily.    . ranitidine (ZANTAC) 300 MG tablet Take 300 mg by mouth daily as needed for heartburn.    . vitamin B-12 (CYANOCOBALAMIN) 100 MCG tablet Take 100 mcg by mouth daily.    . vitamin C (ASCORBIC ACID) 500 MG tablet Take 500 mg by mouth daily.    Marland Kitchen FLUTICASONE PROPIONATE, INHAL, IN Inhale 1 spray into the lungs daily as needed (allergies).     Marland Kitchen HYDROcodone-acetaminophen (NORCO) 10-325 MG tablet Take 1 tablet by mouth every 6 (six) hours as needed. (Patient not taking: Reported on 06/19/2015) 20 tablet 0  . oxyCODONE-acetaminophen (PERCOCET/ROXICET) 5-325 MG tablet TK 1 T PO Q 6 H PRN P  0   No current facility-administered medications for this encounter.    REVIEW OF SYSTEMS:  Notable for that above.   PHYSICAL EXAM:  height is 5\' 4"  (1.626 m) and weight is 256 lb 4.8 oz (116.257 kg). Her oral temperature is 97.9 F (36.6 C). Her blood pressure is 123/67 and her pulse is 81. Her respiration is 22 and oxygen saturation is 95%.   General: Alert and oriented, in no acute distress HEENT: Head is normocephalic. Extraocular movements are intact. Oropharynx is notable for no lesions. Oral cavity shows a left posterior/lateral oral tongue tumor, exophytic without fungation, 2-3 cm (cannot appreciate  most posterior border of tumor). Neck: Neck is notable for no appreciable adenoapthy Heart: Regular in rate and rhythm with no murmurs, rubs, or gallops. Chest: Clear to auscultation bilaterally, with no rhonchi, wheezes, or rales. Abdomen: Soft,+ distended, nontender,  with no rigidity or guarding. Extremities: No cyanosis or edema. Lymphatics: see Neck Exam Skin: rough, scaly, erythematous skin of LE's. Musculoskeletal: symmetric strength and muscle tone throughout. Neurologic: Cranial nerves II through XII are grossly intact. No obvious  focalities. Speech is fluent. Coordination is intact. Psychiatric: Judgment and insight are intact. Affect is appropriate.   ECOG = 1 0 - Asymptomatic (Fully active, able to carry on all predisease activities without restriction)  1 - Symptomatic but completely ambulatory (Restricted in physically strenuous activity but ambulatory and able to carry out work of a light or sedentary nature. For example, light housework, office work)  2 - Symptomatic, <50% in bed during the day (Ambulatory and capable of all self care but unable to carry out any work activities. Up and about more than 50% of waking hours)  3 - Symptomatic, >50% in bed, but not bedbound (Capable of only limited self-care, confined to bed or chair 50% or more of waking hours)  4 - Bedbound (Completely disabled. Cannot carry on any self-care. Totally confined to bed or chair)  5 - Death   Eustace Pen MM, Creech RH, Tormey DC, et al. [phone]). "Toxicity and response criteria of the [facility]". [place] Oncol. 5 (6): 649-55   LABORATORY DATA:  Lab Results  Component Value Date  WBC 10.1 01/06/2013   HGB 10.5* 05/12/2015   HCT 31.0* 05/12/2015   MCV 85.4 01/06/2013   PLT 269 01/06/2013   CMP     Component Value Date/Time   NA 141 05/12/2015 0856   K 4.8 05/12/2015 0856   CL 100 01/06/2013 1402   CO2 25 01/06/2013 1402   GLUCOSE 85 05/12/2015 0856   BUN 34* 01/06/2013 1402   CREATININE 1.67* 01/06/2013 1402   CALCIUM 9.1 01/06/2013 1402   PROT 7.4 01/17/2012 1858   ALBUMIN 3.4* 01/17/2012 1858   AST 10 01/17/2012 1858   ALT 15 01/17/2012 1858   ALKPHOS 99 01/17/2012 1858   BILITOT 0.2* 01/17/2012 1858   GFRNONAA 32* 01/06/2013 1402   GFRAA 37* 01/06/2013 1402         RADIOGRAPHY: No results found.    IMPRESSION/PLAN:  This is a delightful patient with tongue cancer. I may recommend radiotherapy for this patient adjuvantly, but I told her I believe up front surgery will be the  cornerstone of curative treatment.  I spoke with Dr. Benjamine Mola and he is amenable to me referring her to [facility] ENT department for management. I will do this.  We discussed the potential risks, benefits, and side effects of radiotherapy. We talked in detail about acute and late effects. We discussed that some of the most bothersome acute effects may be mucositis, dysgeusia, salivary changes, skin irritation, hair loss, dehydration, weight loss and fatigue. We talked about late effects which include but are not necessarily limited to dysphagia, hypothyroidism, nerve injury, spinal cord injury, xerostomia, trismus, and neck edema. No guarantees of treatment were given. A consent form was signed and placed in the patient's medical record.  I look forward to participating in the patient's care (if warranted).    Simulation (treatment planning) will take place only if path results indicate risk factors warranting RT (she understands there is a decent possibility of this).  OF NOTE, PET is pending from next week to fully stage her cancer. I doubt she can have a contrasted CT or MRI study due to renal failure.  We also discussed that the treatment of head and neck cancer is a multidisciplinary process to maximize treatment outcomes and quality of life. For this reasons the following referrals have been or will be made:    Dentistry for dental evaluation, possible extractions in the radiation fields, and /or advice on reducing risk of cavities, osteoradionecrosis, or other oral issues.    Nutritionist for nutrition support during and after treatment.    Speech language pathology for swallowing and/or speech therapy.    Social work for social support.      __________________________________________   Eppie Gibson, MD

## 2015-06-22 NOTE — Progress Notes (Signed)
64 year old female diagnosed with Tongue cancer.  She is a patient of Dr. Isidore Moos.  Past medical history includes diabetes, hypertension, hypothyroidism, stage IV chronic kidney disease, obesity, depression, anemia, sleep apnea, GERD, and arthritis.  Medications include Cymbalta, vitamin D, ferrous sulfate, Lantus, Synthroid, Zantac, vitamin B12, and vitamin C  Labs were reviewed.  There is no recent BMP.  Height: 64 inches. Weight: 256.3 pounds. Usual body weight: 270 pounds. BMI: 43.97.  I met with patient after she saw Dr. Isidore Moos.  Patient states she is considering treatment recommendations: surgery first and then radiation therapy. Current treatment plan does not include chemotherapy. Patient has had her fistula placed however there are no hemodialysis plans at this time.  Patient reports she follows a low-sodium diet per nephrologist. She does not necessarily avoid concentrated sweets and reports history of drinking regular soft drinks every other day. Reports her mouth is sometimes dry and sometimes too moist.  Nutrition diagnosis:  Predicted suboptimal energy intake related to tongue cancer and associated treatments as evidenced by history or presence of a condition for which research shows an increased incidence of suboptimal energy intake.  Intervention: Provided basic education on low-sodium diet and the importance of controlling carbohydrates for improved glycemic control. Encouraged patient to contact her nephrologist for updated diet education needs. I have encouraged patient to request renal diet education from [place] [facility] since they have patient's lab values. Educated patient regarding softer textures of foods and encouraged small amounts of food more often. Questions were answered.  Teach back method was used.  Monitoring, evaluation, goals: Patient will tolerate healthy diet for adequate glycemic control and to avoid worsening chronic kidney disease.  Next  visit: To be scheduled as needed after treatment plan is determined.  **Disclaimer: This note was dictated with voice recognition software. Similar sounding words can inadvertently be transcribed and this note may contain transcription errors which may not have been corrected upon publication of note.**

## 2015-06-23 ENCOUNTER — Telehealth: Payer: Self-pay | Admitting: *Deleted

## 2015-06-23 ENCOUNTER — Encounter: Payer: Medicare Other | Admitting: Nutrition

## 2015-06-23 NOTE — Telephone Encounter (Signed)
  Oncology Nurse Navigator Documentation   Navigator Encounter Type: Telephone (06/23/15 1131)         Interventions: Other (06/23/15 1131)     Per Dr. Isidore Moos in follow-up to patient's consult yesterday, called Dr. Serita Grit office, [facility], requested most recent BUN and creatinine results.  Spoke with Jan, RN, who indicated most recent lab of 06/12/15 indicated BUN 46, creatinine 3.55.  She is faxing a report copy for scanning.  Dr. Isidore Moos informed.  Gayleen Orem, RN, BSN, [place] at [place] [phone]          Time Spent with Patient: 15 (06/23/15 1131)

## 2015-06-23 NOTE — Telephone Encounter (Signed)
CALLED PATIENT TO INFORM OF APPT. WITH CARL Magnolia ON 07-02-15 - ARRIVAL TIME - 3:15 PM @ [place], [place].  NO. - [phone], LVM FOR A RETURN CALL

## 2015-06-23 NOTE — Telephone Encounter (Signed)
Called patient to inform of appt. With Dr. Macky Lower on 07-03-15- arrival time - 10:45 am - address - [facility], [address] [place], [place]. No - [phone], lvm for a return call

## 2015-06-25 ENCOUNTER — Encounter: Payer: Self-pay | Admitting: Surgery

## 2015-06-29 ENCOUNTER — Ambulatory Visit (INDEPENDENT_AMBULATORY_CARE_PROVIDER_SITE_OTHER): Payer: Self-pay | Admitting: Surgery

## 2015-06-29 ENCOUNTER — Ambulatory Visit (HOSPITAL_COMMUNITY)
Admit: 2015-06-29 | Discharge: 2015-06-29 | Disposition: A | Discharge status: Home / Self Care | Payer: Medicare Other | Source: Ambulatory Visit | Attending: Surgery | Admitting: Surgery

## 2015-06-29 ENCOUNTER — Encounter: Payer: Self-pay | Admitting: Surgery

## 2015-06-29 VITALS — BP 158/88 | HR 75 | Ht 64.0 in | Wt 256.0 lb

## 2015-06-29 DIAGNOSIS — N186 End stage renal disease: Secondary | ICD-10-CM

## 2015-06-29 DIAGNOSIS — I70208 Unspecified atherosclerosis of native arteries of extremities, other extremity: Secondary | ICD-10-CM | POA: Diagnosis not present

## 2015-06-29 DIAGNOSIS — Z79899 Other long term (current) drug therapy: Secondary | ICD-10-CM | POA: Diagnosis not present

## 2015-06-29 DIAGNOSIS — J90 Pleural effusion, not elsewhere classified: Secondary | ICD-10-CM | POA: Insufficient documentation

## 2015-06-29 DIAGNOSIS — C029 Malignant neoplasm of tongue, unspecified: Secondary | ICD-10-CM | POA: Insufficient documentation

## 2015-06-29 DIAGNOSIS — Z4931 Encounter for adequacy testing for hemodialysis: Secondary | ICD-10-CM | POA: Diagnosis present

## 2015-06-29 NOTE — Addendum Note (Signed)
Addended by: Dorthula Rue L on: 06/29/2015 04:44 PM   Modules accepted: Orders

## 2015-06-29 NOTE — Progress Notes (Signed)
Patient name: Tami Elliott MRN: [id number] DOB: October 03, 1950 Sex: female     Chief Complaint  Patient presents with  . Re-evaluation    6 wk f/u s/p L RC AVF     HISTORY OF PRESENT ILLNESS: The patient is back for follow-up.  She is status post left radiocephalic fistula on [id number].  She is not yet on dialysis.  She denies any symptoms of steal.  Past Medical History  Diagnosis Date  . Diabetes mellitus   . Hypertension   . Hypothyroidism   . Cataract     Left eye( Dr. Katy Fitch, patient uninsured so she can't  get  an intervention)  . Chronic kidney disease     nephrolithiasis, s/p removal 12/05/07  . Back pain     myofascial, excacerberated in 6-7/09, 9/09- required narcotics at those times  . Elbow fracture, right   . Obesity   . Depression     better on nortriptyline  . Hyperplastic colon polyp 2001    f/u colonoscopy 11/23/06 was normal( Dr. Carlean Purl), falls into normal risk screening( next colonoscopy due in 2018)  . Pap smear for cervical cancer screening     12/14/2005: normal, evidence of candida. 05/16/2007: normal, benign reperative changes.  . Anemia   . Sleep apnea   . Pneumonia     as a child  . GERD (gastroesophageal reflux disease)   . Arthritis   . Tongue cancer ([place]) 06/16/2015    SCCa of Left Lateral Tongue  . Nephrolithiasis 11/2007, 01/2012  . Cancer ([place]) 06/08/15 bx    SCC of left lateral tongue     Past Surgical History  Procedure Laterality Date  . Cataract extraction Left   . Lithotripsy  11/2007  . Dilation and curettage of uterus    . Av fistula placement Left 05/12/2015    Procedure: ARTERIOVENOUS (AV) FISTULA CREATION;  Surgeon: Serafina Mitchell, MD;  Location: [place];  Service: Vascular;  Laterality: Left;  . Av fistula placement  05/12/15    left fore arm    Social History   Social History  . Marital Status: Married    Spouse Name: N/A  . Number of Children: 2  . Years of Education: N/A   Occupational History  . Not on file.     Social History Main Topics  . Smoking status: Never Smoker   . Smokeless tobacco: Never Used  . Alcohol Use: No  . Drug Use: No  . Sexual Activity: Not on file   Other Topics Concern  . Not on file   Social History Narrative   Married( 2nd marriage). Husband awaiting back surgery. BE AWARE THAT HUSBAND BROKE PAIN CONTRACT WITH [facility] SEVERAL TIMES!   Patient unemployed. Difficulty finding a jobb/c she is partially blind( but not blind enough to have disability). She used to work in [employer].. The grandson's mother basically   Son 63 y/o , unemployed and grandson lives with her. The grandson's mother basically dropped the child off when he was 73 y/o. They rarely hear from her They moved to an apartment from their house in 12/2008.    Family History  Problem Relation Age of Onset  . Diabetes Mother   . Heart disease Mother     before age 56  . Hypertension Mother     Allergies as of 06/29/2015  . (No Known Allergies)    Current Outpatient Prescriptions on File Prior to Visit  Medication Sig Dispense Refill  .  amLODipine (NORVASC) 10 MG tablet TAKE 1 TABLET BY MOUTH DAILY  12  . aspirin 81 MG tablet Take 81 mg by mouth daily.      . cetirizine (ZYRTEC) 10 MG tablet Take 10 mg by mouth daily as needed for allergies.   5  . Cholecalciferol (VITAMIN D3) 2000 UNITS TABS Take 1 capsule by mouth daily.    . Dulaglutide (TRULICITY) 1.5 0000000 SOPN Inject 1.5 Units into the skin once a week.    . DULoxetine (CYMBALTA) 30 MG capsule Take 30 mg by mouth every morning.     . DULoxetine (CYMBALTA) 60 MG capsule Take 60 mg by mouth every evening.      . ferrous sulfate 325 (65 FE) MG tablet Take 325 mg by mouth daily with breakfast.    . FLUTICASONE PROPIONATE, INHAL, IN Inhale 1 spray into the lungs daily as needed (allergies).     . insulin glargine (LANTUS) 100 UNIT/ML injection Inject 64 Units into the skin at bedtime.     Marland Kitchen levothyroxine (SYNTHROID, LEVOTHROID) 150  MCG tablet Take 150 mcg by mouth daily.      . metoprolol (LOPRESSOR) 50 MG tablet Take 50 mg by mouth 2 (two) times daily.    Marland Kitchen oxyCODONE-acetaminophen (PERCOCET/ROXICET) 5-325 MG tablet TK 1 T PO Q 6 H PRN P  0  . ranitidine (ZANTAC) 300 MG tablet Take 300 mg by mouth daily as needed for heartburn.    . vitamin B-12 (CYANOCOBALAMIN) 100 MCG tablet Take 100 mcg by mouth daily.    . vitamin C (ASCORBIC ACID) 500 MG tablet Take 500 mg by mouth daily.    Marland Kitchen HYDROcodone-acetaminophen (NORCO) 10-325 MG tablet Take 1 tablet by mouth every 6 (six) hours as needed. (Patient not taking: Reported on 06/19/2015) 20 tablet 0   No current facility-administered medications on file prior to visit.     REVIEW OF SYSTEMS: No changes  PHYSICAL EXAMINATION:   Vital signs are  Filed Vitals:   06/29/15 1543  BP: 158/88  Pulse: 75  Height: 5\' 4"  (1.626 m)  Weight: 256 lb (116.121 kg)  SpO2: 98%   Body mass index is 43.92 kg/(m^2). General: The patient appears their stated age. Palpable thrill within radiocephalic fistula, however it diminishes as you get to the mid forearm.  Diagnostic Studies Fistula evaluation today shows excellent diameter vein with all measures greater than 0.58 cm.  It does become a little deep in the mid to upper forearm with 2 branches.  There is also a possible retained valve at the anastomosis.  Assessment: Chronic renal insufficiency Plan: My initial plan was to proceed with elevation of the fistula, making an incision in the mid forearm to the antecubital crease, elevating the vein and ligating the branches.  At the same time I would cannulate the fistula and she did fistulogram to evaluate the arch oh venous anastomosis, and potentially perform balloon angioplasty.  Unfortunately, the patient has recently been diagnosed with cancer of the tongue and is scheduled to undergo surgery as well as radiation in the immediate future.  For that reason we will delay any intervention  on the fistula.  Should she go into renal failure requiring dialysis in the next several months, I would feel comfortable with cannulation of the fistula.  I have her scheduled for follow-up in 3 months with a repeat fistula ultrasound  V. Leia Alf, M.D. [facility] Office: [phone] Pager:  [phone]

## 2015-06-30 ENCOUNTER — Ambulatory Visit (HOSPITAL_COMMUNITY)
Admission: RE | Admit: 2015-06-30 | Discharge: 2015-06-30 | Disposition: A | Discharge status: Home / Self Care | Payer: Medicare Other | Source: Ambulatory Visit | Attending: Hematology and Oncology | Admitting: Hematology and Oncology

## 2015-06-30 DIAGNOSIS — Z4931 Encounter for adequacy testing for hemodialysis: Secondary | ICD-10-CM | POA: Diagnosis not present

## 2015-06-30 DIAGNOSIS — C029 Malignant neoplasm of tongue, unspecified: Secondary | ICD-10-CM

## 2015-06-30 LAB — GLUCOSE, CAPILLARY: Glucose-Capillary: 61 mg/dL — ABNORMAL LOW (ref 65–99)

## 2015-06-30 MED ORDER — FLUDEOXYGLUCOSE F - 18 (FDG) INJECTION
13.7000 | Freq: Once | INTRAVENOUS | Status: DC | PRN
  Administered 2015-06-30: 13.7 via INTRAVENOUS
  Filled 2015-06-30: qty 13.7

## 2015-07-02 ENCOUNTER — Ambulatory Visit: Payer: Medicare Other | Attending: Radiation Oncology

## 2015-07-02 DIAGNOSIS — R1310 Dysphagia, unspecified: Secondary | ICD-10-CM | POA: Diagnosis not present

## 2015-07-02 NOTE — Patient Instructions (Addendum)
SWALLOWING EXERCISES Do half the number of repetitions once a day until you start (possible) radiation, then Do these 6 of the 7 days per week until 6 months after your last day of radiation, then 3 times per week afterwards  1. Effortful Swallows - Squeeze hard with the muscles in your neck while you swallow your  saliva or a sip of water - Repeat 20 times, 2-3 times a day, and use whenever you eat or drink  2. Masako Swallow - swallow with your tongue sticking out - Stick tongue out past your teeth and gently bite tongue with your teeth - Swallow, while holding your tongue with your teeth - Repeat 20 times, 2-3 times a day *use a wet spoon if your mouth gets dry*  3. Mendelsohn Maneuver - "half swallow" exercise - Start to swallow, and keep your Adam's apple up by squeezing hard with the muscles of the throat - Hold the squeeze for 5-7 seconds and then relax - Repeat 20 times, 2-3 times a day *use a wet spoon if your mouth gets dry*  4. Tongue Press - Press your entire tongue as hard as you can against the roof of your mouth for 3-5 seconds, then swallow - Repeat 20 times, 2-3 times a day  5. Tongue Stretch/Teeth Clean - Move your tongue around the pocket between your gums and teeth, clockwise and then counter-clockwise - Repeat on the back side, clockwise and then counter-clockwise - Repeat 15-20 times, 2-3 times a day   * begin again when your tongue is healed*  6. Breath Hold - Say "HUH!" loudly, then hold your breath for 3 seconds at your voice box - Repeat 20 times, 2-3 times a day  7. Chin pushback - Open your mouth  - Place your fist UNDER your chin near your neck, and push back with your fist for 5 seconds - Repeat 10 times, 2-3 times a day - Feel it in your neck, not your ears

## 2015-07-02 NOTE — Therapy (Signed)
[facility] [address] [place], [place], [address] Phone: [phone]   Fax:  [phone]  Speech Language Pathology Evaluation  Patient Details  Name: Tami Elliott MRN: [id number] Date of Birth: 03-26-1951 Referring Provider: Eppie Gibson M.D.  Encounter Date: 07/02/2015      End of Session - 07/02/15 1626    Visit Number 1   Number of Visits 3   Date for SLP Re-Evaluation 09/29/15  90 days   SLP Start Time [id number]   SLP Stop Time  1617   SLP Time Calculation (min) 44 min   Activity Tolerance Patient tolerated treatment well      Past Medical History  Diagnosis Date  . Diabetes mellitus   . Hypertension   . Hypothyroidism   . Cataract     Left eye( Dr. Katy Fitch, patient uninsured so she can't  get  an intervention)  . Chronic kidney disease     nephrolithiasis, s/p removal 12/05/07  . Back pain     myofascial, excacerberated in 6-7/09, 9/09- required narcotics at those times  . Elbow fracture, right   . Obesity   . Depression     better on nortriptyline  . Hyperplastic colon polyp 2001    f/u colonoscopy 11/23/06 was normal( Dr. Carlean Purl), falls into normal risk screening( next colonoscopy due in 2018)  . Pap smear for cervical cancer screening     12/14/2005: normal, evidence of candida. 05/16/2007: normal, benign reperative changes.  . Anemia   . Sleep apnea   . Pneumonia     as a child  . GERD (gastroesophageal reflux disease)   . Arthritis   . Tongue cancer ([place]) 06/16/2015    SCCa of Left Lateral Tongue  . Nephrolithiasis 11/2007, 01/2012  . Cancer ([place]) 06/08/15 bx    SCC of left lateral tongue     Past Surgical History  Procedure Laterality Date  . Cataract extraction Left   . Lithotripsy  11/2007  . Dilation and curettage of uterus    . Av fistula placement Left 05/12/2015    Procedure: ARTERIOVENOUS (AV) FISTULA CREATION;  Surgeon: Serafina Mitchell, MD;  Location: [facility];  Service:  Vascular;  Laterality: Left;  . Av fistula placement  05/12/15    left fore arm    There were no vitals filed for this visit.  Visit Diagnosis: Dysphagia      Subjective Assessment - 07/02/15 1634    Subjective Pt denies s/s aspiration during meals.            SLP Evaluation [facility] - 07/02/15 1539    SLP Visit Information   SLP Received On 07/02/15   Referring Provider Eppie Gibson M.D.   Onset Date July 2016   Medical Diagnosis Tongue Cancer   Cognition   Overall Cognitive Status Within Functional Limits for tasks assessed   Auditory Comprehension   Overall Auditory Comprehension Appears within functional limits for tasks assessed   Verbal Expression   Overall Verbal Expression Appears within functional limits for tasks assessed   Oral Motor/Sensory Function   Overall Oral Motor/Sensory Function Impaired  primarily due to pain   Labial Coordination WFL   Lingual ROM Reduced left  due to pain   Lingual Symmetry Within Functional Limits   Lingual Strength Within Functional Limits   Lingual Coordination WFL   Velum Within Functional Limits   Overall Oral Motor/Sensory Function Pt's lingual ROM hindered slightly by pain   Motor Speech   Overall  Motor Speech Appears within functional limits for tasks assessed      Pt currently prefers softer diet and thin liquids. Pt with consult at [facility] tomorrow for possible surgery. Adjuvant radiation is possible at this time, post surgery.  Oral-motor assessment as above. POs: Pt ate cereal bar and drank water without overt s/s aspiration. Thyroid elevation appeared WFL, and swallows appeared timely. Oral residue noted as minimal with pt swallowing twice to clear oral residue. Pt's swallow deemed WFL/WNL at this time.   Because data states the risk for dysphagia during and after radiation treatment is high due to undergoing radiation tx, SLP taught pt about the possibility of reduced/limited ability for PO intake during  rad tx. SLP encouraged pt to continue swallowing POs as far into rad tx as possible, even ingesting POs and/or completing HEP shortly after administration of pain meds.   SLP educated pt re: changes to swallowing musculature after rad tx, and why adherence to dysphagia HEP provided today and PO consumption was necessary to reduce muscle fibrosis following rad tx. Pt demonstrated understanding of these things to SLP.  After eval tasks, SLP then developed a HEP for pt and pt was instructed how to perform exercises involving lingual, vocal, and pharyngeal strengthening, as radiation therapy is still being considered at this time. SLP performed each exercise and pt return demonstrated each exercise. SLP ensured pt performance was correct prior to moving on to next exercise. Pt was instructed to complete this program once with half the number of reps until her surgery, then continue after healing allows. If she begins radiation, complete HEP x2-3 times a day, 6 of the 7 days/week until 60 days after their last rad tx, then x3 a week after that. SLP shared with pt that if adjuvant radiation therapy is not deemed necessary she should discontinue this HEP, and contact her surgeon should she have incr'd frequency of coughing or choking during meals.                     SLP Education - 07/02/15 1625    Education provided Yes   Education Details HEP, muscle fibrosis, swallowing during rad tx   Person(s) Educated Patient   Methods Explanation;Demonstration;Verbal cues;Handout   Comprehension Verbalized understanding;Returned demonstration;Verbal cues required          SLP Short Term Goals - 07/02/15 1628    SLP SHORT TERM GOAL #1   Title pt will tell SLP why she is completing HEP with modified independence   Time 1   Period --  visit   Status New   SLP SHORT TERM GOAL #2   Title pt will complete HEP with occasional min A   Time 1   Period --  visit   Status New          SLP  Long Term Goals - 07/02/15 1629    SLP LONG TERM GOAL #1   Title pt will demo HEP with modified independence   Time 3   Period --  visits   Status New   SLP LONG TERM GOAL #2   Title pt will tell SLP three s/s aspiration PNA with modified independence   Time 3   Period --  visits   Status New          Plan - 07/02/15 1626    Clinical Impression Statement Pt presents with swallowing WFL/WNL however pt's swallowing ability will likely decr with possible radiation therapy TBD at a  later date. Skilled ST remains needed if pt undergoes radiation tx to assess success with HEP and safety with POs.   Speech Therapy Frequency --  approx every 4 weeks.   Duration --  for 90 days   Treatment/Interventions Aspiration precaution training;Pharyngeal strengthening exercises;Oral motor exercises;Compensatory strategies;SLP instruction and feedback;Patient/family education   Potential to Achieve Goals Good   SLP Home Exercise Plan provided today   Consulted and Agree with Plan of Care Patient          G-Codes - 07-18-2015 1630    Functional Assessment Tool Used noms   Functional Limitations Swallowing   Swallow Current Status [id number]) At least 1 percent but less than 20 percent impaired, limited or restricted   Swallow Goal Status [id number]) At least 1 percent but less than 20 percent impaired, limited or restricted      Problem List Patient Active Problem List   Diagnosis Date Noted  . Tongue cancer ([place]) 06/16/2015  . Acute renal failure ([place]) 01/17/2012  . Hydronephrosis 01/17/2012  . Nephrolithiasis 01/17/2012  . BLISTER, FOOT 06/25/2009  . Type 2 diabetes mellitus with diabetic neuropathy, with long-term current use of insulin ([place]) 12/17/2008  . Chronic kidney disease, stage IV (severe) ([place]) 12/17/2008  . ALLERGIC RHINITIS, SEASONAL 12/15/2008  . OBESITY 04/07/2008  . DEPRESSIVE DISORDER 04/07/2008  . NEPHROLITHIASIS 11/02/2007  . UNSPECIFIED DENTAL CARIES 09/05/2007  .  HEARING LOSS, TINNITUS 05/16/2007  . BACK PAIN 05/01/2007  . HYPERLIPIDEMIA 10/26/2006  . PSORIASIS 10/26/2006  . HYPOTHYROIDISM 06/16/2006  . HYPERTENSION 06/16/2006    [place] , Emma, CCC-SLP  Jul 18, 2015, 4:34 PM  [facility] [address] [place], [place], [address] Phone: [phone]   Fax:  [phone]  Name: Tami Elliott MRN: [id number] Date of Birth: [birth year]

## 2015-07-06 ENCOUNTER — Telehealth: Payer: Self-pay | Admitting: *Deleted

## 2015-07-06 NOTE — Telephone Encounter (Signed)
  Oncology Nurse Navigator Documentation   Navigator Encounter Type: Telephone (07/06/15 1305) Patient Visit Type: Follow-up (07/06/15 1305)     Called patient to follow-up on last Friday's consult appt with Dr. Vicie Mutters.  LVMM asking her to give me a call.  Gayleen Orem, RN, BSN, [place] at [place] [phone]                   Time Spent with Patient: 15 (07/06/15 1305)

## 2015-07-07 ENCOUNTER — Telehealth: Payer: Self-pay | Admitting: *Deleted

## 2015-07-07 NOTE — Telephone Encounter (Signed)
  Oncology Nurse Navigator Documentation   Navigator Encounter Type: Telephone (07/07/15 1316) Patient Visit Type: Follow-up (07/07/15 1316)     Patient returned my VMM, reported that she met with Dr. Vicie Mutters last week Friday.  She indicated she is moving forward with surgery, has an 11/25 pre-op appt for surgery scheduled on 07/23/15.  She had no information re dental extractions that may be conducted in conjunction with TORS, plans to ask during pre-op visit.  Dr. Isidore Moos provided update.  Gayleen Orem, RN, BSN, [place] at [place] [phone]                   Time Spent with Patient: 15 (07/07/15 1316)

## 2015-07-15 ENCOUNTER — Ambulatory Visit: Payer: Medicare Other

## 2015-07-21 ENCOUNTER — Encounter: Payer: Self-pay | Admitting: Nephrology

## 2015-07-30 ENCOUNTER — Telehealth: Payer: Self-pay | Admitting: *Deleted

## 2015-07-30 NOTE — Telephone Encounter (Signed)
  Oncology Nurse Navigator Documentation   Navigator Encounter Type: Telephone (07/30/15 1147) Patient Visit Type: Follow-up (07/30/15 1147)     Returned patient's VMM.  She informed:  During her pre-surgical workup on 12/8, Dr. Vicie Mutters expressed concern for her SOB and "crackles in my lungs".    She was [place] home with order for oxygen.    She reports she is feeling better though still SOB.  Surgery has been rescheduled for 08/18/15. Dr. Isidore Moos informed.  Gayleen Orem, RN, BSN, [place] at [place] [phone]                   Time Spent with Patient: 15 (07/30/15 1147)

## 2015-08-03 ENCOUNTER — Inpatient Hospital Stay (HOSPITAL_COMMUNITY)
Admission: EM | Admit: 2015-08-03 | Discharge: 2015-08-08 | DRG: 291 | Disposition: A | Discharge status: Home Health | Payer: Medicare Other | Attending: Internal Medicine | Admitting: Internal Medicine

## 2015-08-03 ENCOUNTER — Emergency Department (HOSPITAL_COMMUNITY): Payer: Medicare Other

## 2015-08-03 ENCOUNTER — Encounter (HOSPITAL_COMMUNITY): Payer: Self-pay | Admitting: Emergency Medicine

## 2015-08-03 DIAGNOSIS — E1122 Type 2 diabetes mellitus with diabetic chronic kidney disease: Secondary | ICD-10-CM | POA: Diagnosis present

## 2015-08-03 DIAGNOSIS — E872 Acidosis, unspecified: Secondary | ICD-10-CM

## 2015-08-03 DIAGNOSIS — Z9842 Cataract extraction status, left eye: Secondary | ICD-10-CM

## 2015-08-03 DIAGNOSIS — R072 Precordial pain: Secondary | ICD-10-CM | POA: Diagnosis present

## 2015-08-03 DIAGNOSIS — G4733 Obstructive sleep apnea (adult) (pediatric): Secondary | ICD-10-CM | POA: Diagnosis present

## 2015-08-03 DIAGNOSIS — I132 Hypertensive heart and chronic kidney disease with heart failure and with stage 5 chronic kidney disease, or end stage renal disease: Secondary | ICD-10-CM | POA: Diagnosis not present

## 2015-08-03 DIAGNOSIS — M1990 Unspecified osteoarthritis, unspecified site: Secondary | ICD-10-CM | POA: Diagnosis present

## 2015-08-03 DIAGNOSIS — E875 Hyperkalemia: Secondary | ICD-10-CM | POA: Diagnosis present

## 2015-08-03 DIAGNOSIS — F329 Major depressive disorder, single episode, unspecified: Secondary | ICD-10-CM | POA: Diagnosis present

## 2015-08-03 DIAGNOSIS — R0602 Shortness of breath: Secondary | ICD-10-CM | POA: Diagnosis not present

## 2015-08-03 DIAGNOSIS — T501X5A Adverse effect of loop [high-ceiling] diuretics, initial encounter: Secondary | ICD-10-CM | POA: Diagnosis present

## 2015-08-03 DIAGNOSIS — E039 Hypothyroidism, unspecified: Secondary | ICD-10-CM | POA: Diagnosis present

## 2015-08-03 DIAGNOSIS — C029 Malignant neoplasm of tongue, unspecified: Secondary | ICD-10-CM | POA: Diagnosis present

## 2015-08-03 DIAGNOSIS — N179 Acute kidney failure, unspecified: Secondary | ICD-10-CM | POA: Diagnosis present

## 2015-08-03 DIAGNOSIS — Z85810 Personal history of malignant neoplasm of tongue: Secondary | ICD-10-CM

## 2015-08-03 DIAGNOSIS — Z794 Long term (current) use of insulin: Secondary | ICD-10-CM

## 2015-08-03 DIAGNOSIS — I272 Other secondary pulmonary hypertension: Secondary | ICD-10-CM | POA: Diagnosis present

## 2015-08-03 DIAGNOSIS — J810 Acute pulmonary edema: Secondary | ICD-10-CM

## 2015-08-03 DIAGNOSIS — Z9981 Dependence on supplemental oxygen: Secondary | ICD-10-CM

## 2015-08-03 DIAGNOSIS — J811 Chronic pulmonary edema: Secondary | ICD-10-CM

## 2015-08-03 DIAGNOSIS — J9602 Acute respiratory failure with hypercapnia: Secondary | ICD-10-CM | POA: Diagnosis present

## 2015-08-03 DIAGNOSIS — D638 Anemia in other chronic diseases classified elsewhere: Secondary | ICD-10-CM | POA: Diagnosis present

## 2015-08-03 DIAGNOSIS — R918 Other nonspecific abnormal finding of lung field: Secondary | ICD-10-CM | POA: Diagnosis present

## 2015-08-03 DIAGNOSIS — Z7982 Long term (current) use of aspirin: Secondary | ICD-10-CM

## 2015-08-03 DIAGNOSIS — Z6841 Body Mass Index (BMI) 40.0 and over, adult: Secondary | ICD-10-CM

## 2015-08-03 DIAGNOSIS — N186 End stage renal disease: Secondary | ICD-10-CM | POA: Diagnosis present

## 2015-08-03 DIAGNOSIS — I5033 Acute on chronic diastolic (congestive) heart failure: Secondary | ICD-10-CM | POA: Diagnosis present

## 2015-08-03 DIAGNOSIS — E669 Obesity, unspecified: Secondary | ICD-10-CM | POA: Diagnosis present

## 2015-08-03 DIAGNOSIS — T502X5A Adverse effect of carbonic-anhydrase inhibitors, benzothiadiazides and other diuretics, initial encounter: Secondary | ICD-10-CM | POA: Diagnosis not present

## 2015-08-03 DIAGNOSIS — N2581 Secondary hyperparathyroidism of renal origin: Secondary | ICD-10-CM | POA: Diagnosis present

## 2015-08-03 DIAGNOSIS — Z79899 Other long term (current) drug therapy: Secondary | ICD-10-CM

## 2015-08-03 DIAGNOSIS — J9601 Acute respiratory failure with hypoxia: Secondary | ICD-10-CM | POA: Diagnosis present

## 2015-08-03 DIAGNOSIS — E876 Hypokalemia: Secondary | ICD-10-CM | POA: Diagnosis not present

## 2015-08-03 DIAGNOSIS — K219 Gastro-esophageal reflux disease without esophagitis: Secondary | ICD-10-CM | POA: Diagnosis present

## 2015-08-03 HISTORY — DX: Acute pulmonary edema: J81.0

## 2015-08-03 LAB — I-STAT ARTERIAL BLOOD GAS, ED
ACID-BASE DEFICIT: 3 mmol/L — AB (ref 0.0–2.0)
Acid-base deficit: 4 mmol/L — ABNORMAL HIGH (ref 0.0–2.0)
BICARBONATE: 23.4 meq/L (ref 20.0–24.0)
BICARBONATE: 23.9 meq/L (ref 20.0–24.0)
O2 SAT: 94 %
O2 Saturation: 85 %
PCO2 ART: 50.3 mmHg — AB (ref 35.0–45.0)
PH ART: 7.279 — AB (ref 7.350–7.450)
PO2 ART: 75 mmHg — AB (ref 80.0–100.0)
Patient temperature: 97
TCO2: 25 mmol/L (ref 0–100)
TCO2: 25 mmol/L (ref 0–100)
pCO2 arterial: 51.3 mmHg — ABNORMAL HIGH (ref 35.0–45.0)
pH, Arterial: 7.268 — ABNORMAL LOW (ref 7.350–7.450)
pO2, Arterial: 58 mmHg — ABNORMAL LOW (ref 80.0–100.0)

## 2015-08-03 LAB — COMPREHENSIVE METABOLIC PANEL
ALBUMIN: 3.3 g/dL — AB (ref 3.5–5.0)
ALK PHOS: 119 U/L (ref 38–126)
ALT: 21 U/L (ref 14–54)
ANION GAP: 12 (ref 5–15)
AST: 26 U/L (ref 15–41)
BUN: 77 mg/dL — AB (ref 6–20)
CALCIUM: 8.6 mg/dL — AB (ref 8.9–10.3)
CO2: 23 mmol/L (ref 22–32)
CREATININE: 5.54 mg/dL — AB (ref 0.44–1.00)
Chloride: 101 mmol/L (ref 101–111)
GFR calc Af Amer: 9 mL/min — ABNORMAL LOW (ref >60)
GFR calc non Af Amer: 7 mL/min — ABNORMAL LOW (ref >60)
GLUCOSE: 224 mg/dL — AB (ref 65–99)
Potassium: 5.9 mmol/L — ABNORMAL HIGH (ref 3.5–5.1)
Sodium: 136 mmol/L (ref 135–145)
Total Bilirubin: 0.9 mg/dL (ref 0.3–1.2)
Total Protein: 7.2 g/dL (ref 6.5–8.1)

## 2015-08-03 LAB — I-STAT CG4 LACTIC ACID, ED: LACTIC ACID, VENOUS: 1.27 mmol/L (ref 0.5–2.0)

## 2015-08-03 LAB — PROTIME-INR
INR: 1.15 (ref 0.00–1.49)
PROTHROMBIN TIME: 14.9 s (ref 11.6–15.2)

## 2015-08-03 LAB — I-STAT CHEM 8, ED
BUN: 89 mg/dL — AB (ref 6–20)
CHLORIDE: 103 mmol/L (ref 101–111)
Calcium, Ion: 1.11 mmol/L — ABNORMAL LOW (ref 1.13–1.30)
Creatinine, Ser: 5.1 mg/dL — ABNORMAL HIGH (ref 0.44–1.00)
Glucose, Bld: 220 mg/dL — ABNORMAL HIGH (ref 65–99)
HCT: 29 % — ABNORMAL LOW (ref 36.0–46.0)
Hemoglobin: 9.9 g/dL — ABNORMAL LOW (ref 12.0–15.0)
POTASSIUM: 5.6 mmol/L — AB (ref 3.5–5.1)
SODIUM: 135 mmol/L (ref 135–145)
TCO2: 25 mmol/L (ref 0–100)

## 2015-08-03 LAB — I-STAT TROPONIN, ED: TROPONIN I, POC: 0.07 ng/mL (ref 0.00–0.08)

## 2015-08-03 LAB — TROPONIN I: Troponin I: 0.06 ng/mL — ABNORMAL HIGH (ref <0.031)

## 2015-08-03 LAB — BRAIN NATRIURETIC PEPTIDE: B Natriuretic Peptide: 540.5 pg/mL — ABNORMAL HIGH (ref 0.0–100.0)

## 2015-08-03 MED ORDER — SODIUM CHLORIDE 0.9 % IV SOLN
1.0000 g | Freq: Once | INTRAVENOUS | Status: AC
  Administered 2015-08-03: 1 g via INTRAVENOUS
  Filled 2015-08-03: qty 10

## 2015-08-03 MED ORDER — FUROSEMIDE 10 MG/ML IJ SOLN
160.0000 mg | Freq: Four times a day (QID) | INTRAVENOUS | Status: DC
  Administered 2015-08-04 – 2015-08-07 (×14): 160 mg via INTRAVENOUS
  Filled 2015-08-03 (×19): qty 16

## 2015-08-03 MED ORDER — FUROSEMIDE 10 MG/ML IJ SOLN
40.0000 mg | Freq: Once | INTRAMUSCULAR | Status: AC
  Administered 2015-08-03: 40 mg via INTRAVENOUS
  Filled 2015-08-03: qty 4

## 2015-08-03 MED ORDER — FUROSEMIDE 10 MG/ML IJ SOLN
120.0000 mg | Freq: Once | INTRAVENOUS | Status: DC
  Administered 2015-08-04: 120 mg via INTRAVENOUS
  Filled 2015-08-03: qty 12

## 2015-08-03 NOTE — ED Notes (Signed)
Patient her via EMS from home with complaint of shortness of breath for 2-3 days. Family member has been attempting to get patient to come to hospital, but patient refused. Upon EMS arrival patient appeared diaphoretic and pale, became listless, and became unresponsive for about 1 minute. CPR was never started, patient maintained pulse, and then regained consciousness without intervention. Non-rebreather was applied, and then CPAP. EMS administered Solu-medrol 125mg  and a Duo-Neb. Upon arrival patient still with obvious increased work of breathing on CPAP and conscious. Patient having difficultly speaking in complete sentences when removed from CPAP for initial assessment.

## 2015-08-03 NOTE — Consult Note (Signed)
Patient is a 64 year old femaled with CKD followed by Dr. Posey Pronto s/p AVF of LUE by Dr. Trula Slade on 05/12/15 and recent diagnosis of SCC of the left tongue with plans for surgery and radiation therapy.  She was seen recently at [facility] in early December for tongue surgery but it was cancelled due to dyspnea. She presents tonight to [facility] ED with increase SOB and CXR with evidence of volume overload.  Labs were remarkable for a BUN of 77 and creatinine of 5.28m/dl and potassium of 5.9.  She admits her appetite has been poor recently.  Past Medical History  Diagnosis Date  . Diabetes mellitus   . Hypertension   . Hypothyroidism   . Cataract     Left eye( Dr. GKaty Fitch patient uninsured so she can't  get  an intervention)  . Chronic kidney disease     nephrolithiasis, s/p removal 12/05/07  . Back pain     myofascial, excacerberated in 6-7/09, 9/09- required narcotics at those times  . Elbow fracture, right   . Obesity   . Depression     better on nortriptyline  . Hyperplastic colon polyp 2001    f/u colonoscopy 11/23/06 was normal( Dr. GCarlean Purl, falls into normal risk screening( next colonoscopy due in 2018)  . Pap smear for cervical cancer screening     12/14/2005: normal, evidence of candida. 05/16/2007: normal, benign reperative changes.  . Anemia   . Sleep apnea   . Pneumonia     as a child  . GERD (gastroesophageal reflux disease)   . Arthritis   . [place]    SCCa of Left Lateral Tongue  . Nephrolithiasis 11/2007, 01/2012  . Cancer [place] 06/[place]/15 bx    SCC of left lateral tongue    Past Surgical History  Procedure Laterality Date  . Cataract extraction Left   . Lithotripsy  11/2007  . Dilation and curettage of uterus    . Av fistula placement Left 05/12/2015    Procedure: ARTERIOVENOUS (AV) FISTULA CREATION;  Surgeon: VSerafina Mitchell MD;  [place]  Service: Vascular;  Laterality: Left;  . Av fistula placement  05/12/15    left fore arm   Social History:   reports that she has never smoked. She has never used smokeless tobacco. She reports that she does not drink alcohol or use illicit drugs. Allergies: No Known Allergies Family History  Problem Relation Age of Onset  . Diabetes Mother   . Heart disease Mother     before age 63 . Hypertension Mother     Medications:  Prior to Admission:  (Not in a hospital admission) Scheduled: . furosemide  40 mg Intravenous Once   ROS: difficult to ascertain as pt on BiPAP  Blood pressure 151/81, pulse 63, temperature 97 F (36.1 C), temperature source Temporal, resp. rate 17, SpO2 93 %.  General appearance: alert and cooperative Head: Normocephalic, without obvious abnormality, atraumatic, BiPAP in use  Mouth could not be examined on BiPAP BiPAP in place Resp: clear to auscultation bilaterally Chest wall: no tenderness Cardio: regular rate and rhythm, S1, S2 normal, no murmur, click, rub or gallop GI: protuberant and firm Extremities: edema 1+ left arm Cimino fistula that is small Skin: Skin color, texture, turgor normal. No rashes or lesions Neurologic: Grossly normal Results for orders placed or performed during the hospital encounter of 08/03/15 (from the past 48 hour(s))  I-Stat Arterial Blood Gas, ED - ([facility])  Status: Abnormal   Collection Time: 08/03/15  9:50 PM  Result Value Ref Range   pH, Arterial 7.279 (L) 7.350 - 7.450   pCO2 arterial 50.3 (H) 35.0 - 45.0 mmHg   pO2, Arterial 75.0 (L) 80.0 - 100.0 mmHg   Bicarbonate 23.9 20.0 - 24.0 mEq/L   TCO2 25 0 - 100 mmol/L   O2 Saturation 94.0 %   Acid-base deficit 3.0 (H) 0.0 - 2.0 mmol/L   Patient temperature 97.0 F    Collection site RADIAL, ALLEN'S TEST ACCEPTABLE    Drawn by RT    Sample type ARTERIAL   Comprehensive metabolic panel     Status: Abnormal   Collection Time: 08/03/15 10:01 PM  Result Value Ref Range   Sodium 136 135 - 145 mmol/L   Potassium 5.9 (H) 3.5 - 5.1 mmol/L   Chloride 101 101 -  111 mmol/L   CO2 23 22 - 32 mmol/L   Glucose, Bld 224 (H) 65 - 99 mg/dL   BUN 77 (H) 6 - 20 mg/dL   Creatinine, Ser 5.54 (H) 0.44 - 1.00 mg/dL   Calcium 8.6 (L) 8.9 - 10.3 mg/dL   Total Protein 7.2 6.5 - 8.1 g/dL   Albumin 3.3 (L) 3.5 - 5.0 g/dL   AST 26 15 - 41 U/L   ALT 21 14 - 54 U/L   Alkaline Phosphatase 119 38 - 126 U/L   Total Bilirubin 0.9 0.3 - 1.2 mg/dL   GFR calc non Af Amer 7 (L) >60 mL/min   GFR calc Af Amer 9 (L) >60 mL/min    Comment: (NOTE) The eGFR has been calculated using the CKD EPI equation. This calculation has not been validated in all clinical situations. eGFR's persistently <60 mL/min signify possible Chronic Kidney Disease.    Anion gap 12 5 - 15  Troponin I     Status: Abnormal   Collection Time: 08/03/15 10:01 PM  Result Value Ref Range   Troponin I 0.06 (H) <0.031 ng/mL    Comment:        PERSISTENTLY INCREASED TROPONIN VALUES IN THE RANGE OF 0.04-0.49 ng/mL CAN BE SEEN IN:       -UNSTABLE ANGINA       -CONGESTIVE HEART FAILURE       -MYOCARDITIS       -CHEST TRAUMA       -ARRYHTHMIAS       -LATE PRESENTING MYOCARDIAL INFARCTION       -COPD   CLINICAL FOLLOW-UP RECOMMENDED.   Protime-INR     Status: None   Collection Time: 08/03/15 10:01 PM  Result Value Ref Range   Prothrombin Time 14.9 11.6 - 15.2 seconds   INR 1.15 0.00 - 1.49  I-stat troponin, ED     Status: None   Collection Time: 08/03/15 10:08 PM  Result Value Ref Range   Troponin i, poc 0.07 0.00 - 0.08 ng/mL   Comment 3            Comment: Due to the release kinetics of cTnI, a negative result within the first hours of the onset of symptoms does not rule out myocardial infarction with certainty. If myocardial infarction is still suspected, repeat the test at appropriate intervals.   I-Stat CG4 Lactic Acid, ED     Status: None   Collection Time: 08/03/15 10:10 PM  Result Value Ref Range   Lactic Acid, Venous 1.27 0.5 - 2.0 mmol/L  I-stat chem 8, ed     Status: Abnormal  Collection Time: 08/03/15 10:10 PM  Result Value Ref Range   Sodium 135 135 - 145 mmol/L   Potassium 5.6 (H) 3.5 - 5.1 mmol/L   Chloride 103 101 - 111 mmol/L   BUN 89 (H) 6 - 20 mg/dL   Creatinine, Ser 5.10 (H) 0.44 - 1.00 mg/dL   Glucose, Bld 220 (H) 65 - 99 mg/dL   Calcium, Ion 1.11 (L) 1.13 - 1.30 mmol/L   TCO2 25 0 - 100 mmol/L   Hemoglobin 9.9 (L) 12.0 - 15.0 g/dL   HCT 29.0 (L) 36.0 - 46.0 %   Dg Chest Portable 1 View  08/03/2015  CLINICAL DATA:  64 year old female with shortness of breath EXAM: PORTABLE CHEST 1 VIEW COMPARISON:  Chest radiograph dated 01/06/2013 FINDINGS: Single portable view of the chest demonstrate cardiomegaly with bilateral interstitial and central vascular prominence most compatible with congestive changes. Superimposed pneumonia is not excluded. Small bilateral pleural effusions may be present. There is no pneumothorax. The osseous structures are grossly unremarkable. IMPRESSION: Cardiomegaly with congestive changes. Clinical correlation and follow-up recommended. Electronically Signed   By: Anner Crete M.D.   On: 08/03/2015 21:58    Assessment:  1 Uremic syndrome with advanced CKD, volume overload, hyperkalemia, and anorexia 2 Volume overload/CHF 3 Hyperkalemia 4 Respiratory insufficiency 5 Obesity 6 Tongue cancer(SCC), left lateral tongue 7 AVF ? Currently usable/immature  Plan: 1 Attempt to diurese with high dose furosemide--144m q 6hrs 2 May need a tunelled catheter given small size of AVF 3 BiPAP 4 Anticipate need for HD this hospitalization 5 Obtain baseline renal fct data in AM from office  Kelcey Korus C 08/03/2015, 11:17 PM

## 2015-08-03 NOTE — H&P (Signed)
PULMONARY / CRITICAL CARE MEDICINE   Name: Tami Elliott MRN: [id number] DOB: Jan 03, 1951    ADMISSION DATE:  08/03/2015 CONSULTATION DATE:  08/03/2015  REFERRING MD:  Dr. Wyvonnia Dusky EDP  CHIEF COMPLAINT:  SOB  HISTORY OF PRESENT ILLNESS:   64 year old female with a past medical history as below, which includes diabetes, chronic kidney disease, tongue cancer, hypothyroidism, and OSA. She is on home O2. She was recently admitted to [facility] for tongue surgery in the setting of cancer which was supposed be followed up with radiation therapy. The surgery was canceled, however, due to dyspnea. She was hospitalized there for several days was discharged 12/10. She returned home, however, she never felt as though her breathing had returned to baseline. She denies fever, cough, wheezing during that time. She also complained of substernal chest pain "tightness" exacerbated by activity. Dyspnea became progressively worse until 12/19 when she contacted EMS. Upon their arrival she was pale and diaphoretic with her nasal cannula not in place. She reportedly became unresponsive for about 1 minute, however, she never lost pulses and regained consciousness spontaneously. She was placed on a nonrebreather and eventually started on CPAP. Initially in the emergency department she was treated with nebulizers and IV steroids. Chest x-ray was significant for diffuse pulmonary infiltrates concerning for pulmonary edema. She was transitioned onto BiPAP with significant improvement of her symptoms including chest pain which is now resolved. Lab work in the emergency department was significant for potassium 5.9, serum creatinine 5.54, BUN 77, glucose 224, and BNP 540. She was evaluated by the nephrology team in the emergency department who recommend aggressive diuresis at this time and potential progression to hemodialysis. PCCM asked to evaluate further.   PAST MEDICAL HISTORY :  She  has a past medical history  of Diabetes mellitus; Hypertension; Hypothyroidism; Cataract; Chronic kidney disease; Back pain; Elbow fracture, right; Obesity; Depression; Hyperplastic colon polyp (2001); Pap smear for cervical cancer screening; Anemia; Sleep apnea; Pneumonia; GERD (gastroesophageal reflux disease); Arthritis; Tongue cancer ([place]) (06/16/2015); Nephrolithiasis (11/2007, 01/2012); and Cancer ([place]) (06/08/15 bx).  PAST SURGICAL HISTORY: She  has past surgical history that includes Cataract extraction (Left); Lithotripsy (11/2007); Dilation and curettage of uterus; AV fistula placement (Left, 05/12/2015); and AV fistula placement (05/12/15).  No Known Allergies  No current facility-administered medications on file prior to encounter.   Current Outpatient Prescriptions on File Prior to Encounter  Medication Sig  . amLODipine (NORVASC) 10 MG tablet TAKE 1 TABLET BY MOUTH DAILY  . aspirin 81 MG tablet Take 81 mg by mouth daily.    . cetirizine (ZYRTEC) 10 MG tablet Take 10 mg by mouth daily as needed for allergies.   . Cholecalciferol (VITAMIN D3) 2000 UNITS TABS Take 1 capsule by mouth daily.  . Dulaglutide (TRULICITY) 1.5 0000000 SOPN Inject 1.5 Units into the skin once a week.  . DULoxetine (CYMBALTA) 30 MG capsule Take 30 mg by mouth every morning.   . DULoxetine (CYMBALTA) 60 MG capsule Take 60 mg by mouth every evening.    . ferrous sulfate 325 (65 FE) MG tablet Take 325 mg by mouth daily with breakfast.  . FLUTICASONE PROPIONATE, INHAL, IN Inhale 1 spray into the lungs daily as needed (allergies).   Marland Kitchen HYDROcodone-acetaminophen (NORCO) 10-325 MG tablet Take 1 tablet by mouth every 6 (six) hours as needed. (Patient not taking: Reported on 06/19/2015)  . insulin glargine (LANTUS) 100 UNIT/ML injection Inject 64 Units into the skin at bedtime.   Marland Kitchen levothyroxine (SYNTHROID, LEVOTHROID) 150  MCG tablet Take 150 mcg by mouth daily.    . metoprolol (LOPRESSOR) 50 MG tablet Take 50 mg by mouth 2 (two) times daily.  Marland Kitchen  oxyCODONE-acetaminophen (PERCOCET/ROXICET) 5-325 MG tablet TK 1 T PO Q 6 H PRN P  . ranitidine (ZANTAC) 300 MG tablet Take 300 mg by mouth daily as needed for heartburn.  . vitamin B-12 (CYANOCOBALAMIN) 100 MCG tablet Take 100 mcg by mouth daily.  . vitamin C (ASCORBIC ACID) 500 MG tablet Take 500 mg by mouth daily.    FAMILY HISTORY:  Her indicated that her mother is deceased. She indicated that her father is deceased. She indicated that her brother is deceased.   SOCIAL HISTORY: She  reports that she has never smoked. She has never used smokeless tobacco. She reports that she does not drink alcohol or use illicit drugs.  REVIEW OF SYSTEMS:   Bolds are positive  Constitutional: weight loss, gain, night sweats, Fevers, chills, fatigue .  HEENT: headaches, Sore throat, sneezing, nasal congestion, post nasal drip, Difficulty swallowing, Tooth/dental problems, visual complaints visual changes, ear ache CV:  chest pain, radiates: ,Orthopnea, PND, swelling in lower extremities, dizziness, palpitations, syncope.  GI  heartburn, indigestion, abdominal pain, nausea, vomiting, diarrhea, change in bowel habits, loss of appetite, bloody stools.  Resp: cough, productive: , hemoptysis, dyspnea, chest pain, pleuritic.  Skin: rash or itching or icterus GU: dysuria, change in color of urine, urgency or frequency. flank pain, hematuria  MS: joint pain or swelling. decreased range of motion  Psych: change in mood or affect. depression or anxiety.  Neuro: difficulty with speech, weakness, numbness, ataxia    SUBJECTIVE:    VITAL SIGNS: BP 154/76 mmHg  Pulse 67  Temp(Src) 97 F (36.1 C) (Temporal)  Resp 23  SpO2 93%  HEMODYNAMICS:    VENTILATOR SETTINGS: Vent Mode:  [-] BIPAP;PCV FiO2 (%):  [70 %-100 %] 70 % Set Rate:  [15 bmp] 15 bmp PEEP:  [5 cmH20] 5 cmH20  INTAKE / OUTPUT:    PHYSICAL EXAMINATION: General:  Obese female in NAD on BiPAP Neuro:  Alert, oriented, non-focal HEENT:   [place]/AT, mild JVP elevation, PERRL. Tongue mass Cardiovascular:  RRR, no MRG. No peripheral edema Lungs:  Coarse crackles bilaterally Abdomen:  Obese, soft, non-tender, non-distended Musculoskeletal:  No acute deformity or ROM limitation. Skin:  Grossly intact. PVD changes to BLE  LABS:  BMET  Recent Labs Lab 08/03/15 2201 08/03/15 2210  NA 136 135  K 5.9* 5.6*  CL 101 103  CO2 23  --   BUN 77* 89*  CREATININE 5.54* 5.10*  GLUCOSE 224* 220*    Electrolytes  Recent Labs Lab 08/03/15 2201  CALCIUM 8.6*    CBC  Recent Labs Lab 08/03/15 2210  HGB 9.9*  HCT 29.0*    Coag's  Recent Labs Lab 08/03/15 2201  INR 1.15    Sepsis Markers  Recent Labs Lab 08/03/15 2210  LATICACIDVEN 1.27    ABG  Recent Labs Lab 08/03/15 2150  PHART 7.279*  PCO2ART 50.3*  PO2ART 75.0*    Liver Enzymes  Recent Labs Lab 08/03/15 2201  AST 26  ALT 21  ALKPHOS 119  BILITOT 0.9  ALBUMIN 3.3*    Cardiac Enzymes  Recent Labs Lab 08/03/15 2201  TROPONINI 0.06*    Glucose No results for input(s): GLUCAP in the last 168 hours.  Imaging Dg Chest Portable 1 View  08/03/2015  CLINICAL DATA:  64 year old female with shortness of breath EXAM: PORTABLE CHEST 1  VIEW COMPARISON:  Chest radiograph dated 01/06/2013 FINDINGS: Single portable view of the chest demonstrate cardiomegaly with bilateral interstitial and central vascular prominence most compatible with congestive changes. Superimposed pneumonia is not excluded. Small bilateral pleural effusions may be present. There is no pneumothorax. The osseous structures are grossly unremarkable. IMPRESSION: Cardiomegaly with congestive changes. Clinical correlation and follow-up recommended. Electronically Signed   By: Anner Crete M.D.   On: 08/03/2015 21:58     STUDIES:  12/9 echocardiogram > LV EF 0000000, no diastolic dysfunction  CULTURES:   ANTIBIOTICS:   SIGNIFICANT EVENTS: 12/8 admit to [facility] for tongue  surgery, which was cancelled due to dypsnea 12/10 discharged from [facility] 12/19 admit to [facility] for AonCKD, volume overlaod  LINES/TUBES:   DISCUSSION: 64 year old female with past medical history of chronic kidney disease, squamous cell carcinoma of the tongue, and hypertension admitted to [facility] 12/19 for pulmonary edema secondary to volume overload in the setting of chronic kidney disease. Plan is to continue BiPAP for ventilatory support oxygenation. Appreciate nephrology input and will continue to diurese aggressively.  Suspect this will escalate to requiring hemodialysis. Will monitor serial Bmet and correct electrolytes as indicated.  ASSESSMENT / PLAN:  PULMONARY A: Acute hypoxemic/hypercarbic respiratory failure Pulmonary edema secondary to volume overload OSA  P:   Continue BiPAP, goal 4 hours on 1 hour off as tolerates. Repeat ABG Portable chest x-ray in the morning Titrate FiO2 for goal SPO2 greater than 92%.  CARDIOVASCULAR A:  Hypertension Chest pain  P:  Telemetry monitoring Holding by mouth antihypertensive medications while on BiPAP (amlodipine, metoprolol) Will schedule IV metoprolol Continue to trend troponin Repeat EKG in the morning Recent echo from [facility] 12/9  RENAL A:   Acute on chronic kidney disease - baseline = SCr 3 (LUE AVF placed 05/12/15, was being considered for transplant until recent Cancer diagnosis) Volume overload  Hyperkalemia   P:   Nephrology following  Recommend trial of high-dose furosemide Anticipate that she will need hemodialysis some point during this hospitalization Calcium given in the emergency department Will follow serum creatinine every 4 hours Strict I&O, follow urine output  GASTROINTESTINAL A:   GERD   P:  Nothing by mouth while on BiPAP  Continue home ranitidine  HEMATOLOGIC A:   Anemia of chronic illness Tongue Cancer, squamous cell   P:  CBC pending Heparin for VTE ppx Oncology plan  for resection of mass followed by radiation  INFECTIOUS A:   No acute issues  P:   Monitor off ABX Trend WBC and fever curve  ENDOCRINE A:   DM Hypothyroid    P:   Holding home lantus, dulaglutide while NPO CBG monitoring and SSI q 4 hours Assess TSH  NEUROLOGIC A:   No acute issues  P:   Monitor   FAMILY  - Updates: patient updated in ED by [facility], [facility]  - Inter-disciplinary family meet or Palliative Care meeting due by:  12/26  APP critical care time 40 mins.  Georgann Housekeeper, AGACNP-BC [facility] Pulmonology/Critical Care Pager [phone] or [phone]  08/04/2015 12:15 AM

## 2015-08-03 NOTE — ED Provider Notes (Signed)
CSN: [id number]     Arrival date & time 08/03/15  2129 History   First MD Initiated Contact with Patient 08/03/15 2138     Chief Complaint  Patient presents with  . Respiratory Distress     (Consider location/radiation/quality/duration/timing/severity/associated sxs/prior Treatment) HPI Comments:  Patient arrives by EMS from home with shortness of breath for the past 2-3 days. She is found to be diaphoretic and pale on EMS arrival with her oxygen cannula not in place. She became unresponsive for about 1 minute for EMS but never lost pulses regained consciousness without intervention. Patient is placed on nonrebreather and then C Pap. She is given steroids and Nebulizers. Patient denies any history of COPD or asthma. She reportedly has CK D not yet on dialysis because she was recently diagnosed with tongue cancer. She denies any chest pain, cough or fever. She states she's neck and any treatment for tongue cancer at this time. There is a question will history of heart failure. Level 5 caveat due to respiratory distress.  The history is provided by the patient and the EMS personnel. The history is limited by the condition of the patient.    Past Medical History  Diagnosis Date  . Diabetes mellitus   . Hypertension   . Hypothyroidism   . Cataract     Left eye( Dr. Katy Fitch, patient uninsured so she can't  get  an intervention)  . Chronic kidney disease     nephrolithiasis, s/p removal 12/05/07  . Back pain     myofascial, excacerberated in 6-7/09, 9/09- required narcotics at those times  . Elbow fracture, right   . Obesity   . Depression     better on nortriptyline  . Hyperplastic colon polyp 2001    f/u colonoscopy 11/23/06 was normal( Dr. Carlean Purl), falls into normal risk screening( next colonoscopy due in 2018)  . Pap smear for cervical cancer screening     12/14/2005: normal, evidence of candida. 05/16/2007: normal, benign reperative changes.  . Anemia   . Sleep apnea   . Pneumonia      as a child  . GERD (gastroesophageal reflux disease)   . Arthritis   . 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 Nephrolithiasis 11/2007, 01/2012  . Cancer ([place]) 06/08/15 bx    SCC of left lateral tongue    Past Surgical History  Procedure Laterality Date  . Cataract extraction Left   . Lithotripsy  11/2007  . Dilation and curettage of uterus    . Av fistula placement Left 05/12/2015    Procedure: ARTERIOVENOUS (AV) FISTULA CREATION;  Surgeon: Serafina Mitchell, MD;  Location: [facility];  Service: Vascular;  Laterality: Left;  . Av fistula placement  05/12/15    left fore arm   Family History  Problem Relation Age of Onset  . Diabetes Mother   . Heart disease Mother     before age 58  . Hypertension Mother    Social History  Substance Use Topics  . Smoking status: Never Smoker   . Smokeless tobacco: Never Used  . Alcohol Use: No   OB History    No data available     Review of Systems  Unable to perform ROS: Acuity of condition      Allergies  Review of patient's allergies indicates no known allergies.  Home Medications   Prior to Admission medications   Medication Sig Start Date End Date Taking? Authorizing Provider  amLODipine (NORVASC) 10  MG tablet TAKE 1 TABLET BY MOUTH DAILY 03/11/15   Historical Provider, MD  aspirin 81 MG tablet Take 81 mg by mouth daily.      Historical Provider, MD  cetirizine (ZYRTEC) 10 MG tablet Take 10 mg by mouth daily as needed for allergies.  04/02/15   Historical Provider, MD  Cholecalciferol (VITAMIN D3) 2000 UNITS TABS Take 1 capsule by mouth daily.    Historical Provider, MD  Dulaglutide (TRULICITY) 1.5 0000000 SOPN Inject 1.5 Units into the skin once a week.    Historical Provider, MD  DULoxetine (CYMBALTA) 30 MG capsule Take 30 mg by mouth every morning.     Historical Provider, MD  DULoxetine (CYMBALTA) 60 MG capsule Take 60 mg by mouth every evening.      Historical Provider, MD  ferrous sulfate 325 (65 FE) MG  tablet Take 325 mg by mouth daily with breakfast.    Historical Provider, MD  FLUTICASONE PROPIONATE, INHAL, IN Inhale 1 spray into the lungs daily as needed (allergies).     Historical Provider, MD  HYDROcodone-acetaminophen (NORCO) 10-325 MG tablet Take 1 tablet by mouth every 6 (six) hours as needed. Patient not taking: Reported on 06/19/2015 05/12/15   Aldona Bar J Rhyne, PA-C  insulin glargine (LANTUS) 100 UNIT/ML injection Inject 64 Units into the skin at bedtime.     Historical Provider, MD  levothyroxine (SYNTHROID, LEVOTHROID) 150 MCG tablet Take 150 mcg by mouth daily.      Historical Provider, MD  metoprolol (LOPRESSOR) 50 MG tablet Take 50 mg by mouth 2 (two) times daily.    Historical Provider, MD  oxyCODONE-acetaminophen (PERCOCET/ROXICET) 5-325 MG tablet TK 1 T PO Q 6 H PRN P 06/08/15   Historical Provider, MD  ranitidine (ZANTAC) 300 MG tablet Take 300 mg by mouth daily as needed for heartburn.    Historical Provider, MD  vitamin B-12 (CYANOCOBALAMIN) 100 MCG tablet Take 100 mcg by mouth daily.    Historical Provider, MD  vitamin C (ASCORBIC ACID) 500 MG tablet Take 500 mg by mouth daily.    Historical Provider, MD   BP 172/99 mmHg  Pulse 69  Temp(Src) 97 F (36.1 C) (Temporal)  Resp 20  SpO2 100% Physical Exam  Constitutional: She is oriented to person, place, and time. She appears well-developed and well-nourished. She appears distressed.  obese  HENT:  Head: Normocephalic and atraumatic.  Nose: Nose normal.  Mouth/Throat: Oropharynx is clear and moist. No oropharyngeal exudate.  L sided tongue mass  Eyes: Conjunctivae and EOM are normal. Pupils are equal, round, and reactive to light.  Neck: Normal range of motion. Neck supple.  Unable to appreciate JVD  Cardiovascular: Normal rate, regular rhythm and normal heart sounds.   No murmur heard. Pulmonary/Chest: She is in respiratory distress. She has rales. She exhibits no tenderness.  Abdominal: Soft. There is no  tenderness. There is no rebound and no guarding.  Musculoskeletal: Normal range of motion. She exhibits edema.  LUE fistula with thrill  Neurological: She is alert and oriented to person, place, and time. No cranial nerve deficit. She exhibits normal muscle tone. Coordination normal.  Moving all extremities  Skin: Skin is warm.    ED Course  Procedures (including critical care time) Labs Review Labs Reviewed  COMPREHENSIVE METABOLIC PANEL - Abnormal; Notable for the following:    Potassium 5.9 (*)    Glucose, Bld 224 (*)    BUN 77 (*)    Creatinine, Ser 5.54 (*)    Calcium 8.6 (*)  Albumin 3.3 (*)    GFR calc non Af Amer 7 (*)    GFR calc Af Amer 9 (*)    All other components within normal limits  BRAIN NATRIURETIC PEPTIDE - Abnormal; Notable for the following:    B Natriuretic Peptide 540.5 (*)    All other components within normal limits  TROPONIN I - Abnormal; Notable for the following:    Troponin I 0.06 (*)    All other components within normal limits  URINALYSIS, ROUTINE W REFLEX MICROSCOPIC (NOT AT [facility]) - Abnormal; Notable for the following:    APPearance TURBID (*)    Glucose, UA 250 (*)    Hgb urine dipstick MODERATE (*)    Protein, ur >300 (*)    Leukocytes, UA TRACE (*)    All other components within normal limits  URINE MICROSCOPIC-ADD ON - Abnormal; Notable for the following:    Squamous Epithelial / LPF 0-5 (*)    Bacteria, UA RARE (*)    All other components within normal limits  I-STAT ARTERIAL BLOOD GAS, ED - Abnormal; Notable for the following:    pH, Arterial 7.279 (*)    pCO2 arterial 50.3 (*)    pO2, Arterial 75.0 (*)    Acid-base deficit 3.0 (*)    All other components within normal limits  I-STAT CHEM 8, ED - Abnormal; Notable for the following:    Potassium 5.6 (*)    BUN 89 (*)    Creatinine, Ser 5.10 (*)    Glucose, Bld 220 (*)    Calcium, Ion 1.11 (*)    Hemoglobin 9.9 (*)    HCT 29.0 (*)    All other components within normal limits   I-STAT ARTERIAL BLOOD GAS, ED - Abnormal; Notable for the following:    pH, Arterial 7.268 (*)    pCO2 arterial 51.3 (*)    pO2, Arterial 58.0 (*)    Acid-base deficit 4.0 (*)    All other components within normal limits  PROTIME-INR  CBC WITH DIFFERENTIAL/PLATELET  BASIC METABOLIC PANEL  BASIC METABOLIC PANEL  BASIC METABOLIC PANEL  BASIC METABOLIC PANEL  BASIC METABOLIC PANEL  MAGNESIUM  PHOSPHORUS  TROPONIN I  TROPONIN I  TROPONIN I  PROCALCITONIN  CBC  MAGNESIUM  PHOSPHORUS  TSH  I-STAT CG4 LACTIC ACID, ED  I-STAT TROPOININ, ED  I-STAT CG4 LACTIC ACID, ED    Imaging Review Dg Chest Portable 1 View  08/03/2015  CLINICAL DATA:  64 year old female with shortness of breath EXAM: PORTABLE CHEST 1 VIEW COMPARISON:  Chest radiograph dated 01/06/2013 FINDINGS: Single portable view of the chest demonstrate cardiomegaly with bilateral interstitial and central vascular prominence most compatible with congestive changes. Superimposed pneumonia is not excluded. Small bilateral pleural effusions may be present. There is no pneumothorax. The osseous structures are grossly unremarkable. IMPRESSION: Cardiomegaly with congestive changes. Clinical correlation and follow-up recommended. Electronically Signed   By: Anner Crete M.D.   On: 08/03/2015 21:58   I have personally reviewed and evaluated these images and lab results as part of my medical decision-making.   EKG Interpretation   Date/Time:  Monday August 03 2015 21:35:52 EST Ventricular Rate:  74 PR Interval:  121 QRS Duration: 153 QT Interval:  430 QTC Calculation: 477 R Axis:   -36 Text Interpretation:  Sinus rhythm Right bundle branch block No  significant change was found Confirmed by Wyvonnia Dusky  MD, Odis Wickey [phone]) on  08/03/2015 10:34:13 PM      MDM   Final diagnoses:  Acute pulmonary edema (HCC)  Metabolic acidosis  ESRD (end stage renal disease) (HCC)  Hyperkalemia   patient from home with respiratory  distress and shortness of breath on BiPAP. She has rhonchi throughout and is hypoxic as soon as the BiPAP was removed. Denies chest pain. History of ESRD but not yet on dialysis. Recent diagnosis of tongue cancer as well, surgery cancelled due to poor respiratory status.   Continue BiPAP on arrival. EKG is unchanged. Concern for volume overload and will given IV Lasix.   labs show hyperkalemia, worsening renal failure , metabolic acidosis.   Discussed with Dr. Florene Glen. He recommends 160 mg of Lasix, and we'll try to avoid dialysis at this time.  ABG with metabolic acidosis.  Appears comfortable on bipap. EKG unchanged. Treat hyperkalemia. Attempt diuresis per nephrology recommendations. PCCM to admit given tenuous respiratory status.  Stable on Bipap at this time. D/w Dr. Oletta Darter.  CRITICAL CARE Performed by: Ezequiel Essex Total critical care time: 60 minutes Critical care time was exclusive of separately billable procedures and treating other patients. Critical care was necessary to treat or prevent imminent or life-threatening deterioration. Critical care was time spent personally by me on the following activities: development of treatment plan with patient and/or surrogate as well as nursing, discussions with consultants, evaluation of patient's response to treatment, examination of patient, obtaining history from patient or surrogate, ordering and performing treatments and interventions, ordering and review of laboratory studies, ordering and review of radiographic studies, pulse oximetry and re-evaluation of patient's condition.    Ezequiel Essex, MD 08/04/15 [phone]

## 2015-08-04 DIAGNOSIS — Z6841 Body Mass Index (BMI) 40.0 and over, adult: Secondary | ICD-10-CM | POA: Diagnosis not present

## 2015-08-04 DIAGNOSIS — T501X5A Adverse effect of loop [high-ceiling] diuretics, initial encounter: Secondary | ICD-10-CM | POA: Diagnosis present

## 2015-08-04 DIAGNOSIS — E872 Acidosis: Secondary | ICD-10-CM | POA: Insufficient documentation

## 2015-08-04 DIAGNOSIS — E1122 Type 2 diabetes mellitus with diabetic chronic kidney disease: Secondary | ICD-10-CM | POA: Diagnosis present

## 2015-08-04 DIAGNOSIS — K219 Gastro-esophageal reflux disease without esophagitis: Secondary | ICD-10-CM | POA: Diagnosis present

## 2015-08-04 DIAGNOSIS — E876 Hypokalemia: Secondary | ICD-10-CM | POA: Diagnosis not present

## 2015-08-04 DIAGNOSIS — J9602 Acute respiratory failure with hypercapnia: Secondary | ICD-10-CM | POA: Diagnosis present

## 2015-08-04 DIAGNOSIS — N186 End stage renal disease: Secondary | ICD-10-CM | POA: Diagnosis present

## 2015-08-04 DIAGNOSIS — R072 Precordial pain: Secondary | ICD-10-CM | POA: Diagnosis present

## 2015-08-04 DIAGNOSIS — R918 Other nonspecific abnormal finding of lung field: Secondary | ICD-10-CM | POA: Diagnosis present

## 2015-08-04 DIAGNOSIS — I5033 Acute on chronic diastolic (congestive) heart failure: Secondary | ICD-10-CM | POA: Diagnosis present

## 2015-08-04 DIAGNOSIS — F329 Major depressive disorder, single episode, unspecified: Secondary | ICD-10-CM | POA: Diagnosis present

## 2015-08-04 DIAGNOSIS — J810 Acute pulmonary edema: Secondary | ICD-10-CM | POA: Diagnosis not present

## 2015-08-04 DIAGNOSIS — R0602 Shortness of breath: Secondary | ICD-10-CM | POA: Diagnosis present

## 2015-08-04 DIAGNOSIS — J9601 Acute respiratory failure with hypoxia: Secondary | ICD-10-CM | POA: Diagnosis present

## 2015-08-04 DIAGNOSIS — E039 Hypothyroidism, unspecified: Secondary | ICD-10-CM | POA: Diagnosis present

## 2015-08-04 DIAGNOSIS — D638 Anemia in other chronic diseases classified elsewhere: Secondary | ICD-10-CM | POA: Diagnosis present

## 2015-08-04 DIAGNOSIS — N179 Acute kidney failure, unspecified: Secondary | ICD-10-CM | POA: Diagnosis present

## 2015-08-04 DIAGNOSIS — N189 Chronic kidney disease, unspecified: Secondary | ICD-10-CM

## 2015-08-04 DIAGNOSIS — T502X5A Adverse effect of carbonic-anhydrase inhibitors, benzothiadiazides and other diuretics, initial encounter: Secondary | ICD-10-CM | POA: Diagnosis not present

## 2015-08-04 DIAGNOSIS — I5031 Acute diastolic (congestive) heart failure: Secondary | ICD-10-CM | POA: Diagnosis not present

## 2015-08-04 DIAGNOSIS — E669 Obesity, unspecified: Secondary | ICD-10-CM | POA: Diagnosis present

## 2015-08-04 DIAGNOSIS — I509 Heart failure, unspecified: Secondary | ICD-10-CM | POA: Diagnosis not present

## 2015-08-04 DIAGNOSIS — Z794 Long term (current) use of insulin: Secondary | ICD-10-CM | POA: Diagnosis not present

## 2015-08-04 DIAGNOSIS — E875 Hyperkalemia: Secondary | ICD-10-CM | POA: Insufficient documentation

## 2015-08-04 DIAGNOSIS — I132 Hypertensive heart and chronic kidney disease with heart failure and with stage 5 chronic kidney disease, or end stage renal disease: Secondary | ICD-10-CM | POA: Diagnosis present

## 2015-08-04 DIAGNOSIS — N2581 Secondary hyperparathyroidism of renal origin: Secondary | ICD-10-CM | POA: Diagnosis present

## 2015-08-04 DIAGNOSIS — I272 Other secondary pulmonary hypertension: Secondary | ICD-10-CM | POA: Diagnosis present

## 2015-08-04 DIAGNOSIS — C029 Malignant neoplasm of tongue, unspecified: Secondary | ICD-10-CM | POA: Diagnosis present

## 2015-08-04 DIAGNOSIS — E8729 Other acidosis: Secondary | ICD-10-CM | POA: Insufficient documentation

## 2015-08-04 DIAGNOSIS — Z79899 Other long term (current) drug therapy: Secondary | ICD-10-CM | POA: Diagnosis not present

## 2015-08-04 DIAGNOSIS — G4733 Obstructive sleep apnea (adult) (pediatric): Secondary | ICD-10-CM | POA: Diagnosis present

## 2015-08-04 DIAGNOSIS — Z85810 Personal history of malignant neoplasm of tongue: Secondary | ICD-10-CM | POA: Diagnosis not present

## 2015-08-04 DIAGNOSIS — Z9981 Dependence on supplemental oxygen: Secondary | ICD-10-CM | POA: Diagnosis not present

## 2015-08-04 DIAGNOSIS — M1990 Unspecified osteoarthritis, unspecified site: Secondary | ICD-10-CM | POA: Diagnosis present

## 2015-08-04 DIAGNOSIS — Z7982 Long term (current) use of aspirin: Secondary | ICD-10-CM | POA: Diagnosis not present

## 2015-08-04 DIAGNOSIS — Z9842 Cataract extraction status, left eye: Secondary | ICD-10-CM | POA: Diagnosis not present

## 2015-08-04 DIAGNOSIS — N185 Chronic kidney disease, stage 5: Secondary | ICD-10-CM

## 2015-08-04 LAB — BASIC METABOLIC PANEL
ANION GAP: 16 — AB (ref 5–15)
Anion gap: 13 (ref 5–15)
Anion gap: 15 (ref 5–15)
BUN: 82 mg/dL — AB (ref 6–20)
BUN: 82 mg/dL — AB (ref 6–20)
BUN: 85 mg/dL — ABNORMAL HIGH (ref 6–20)
CHLORIDE: 102 mmol/L (ref 101–111)
CHLORIDE: 99 mmol/L — AB (ref 101–111)
CHLORIDE: 101 mmol/L (ref 101–111)
CO2: 21 mmol/L — ABNORMAL LOW (ref 22–32)
CO2: 21 mmol/L — ABNORMAL LOW (ref 22–32)
CO2: 23 mmol/L (ref 22–32)
Calcium: 9 mg/dL (ref 8.9–10.3)
Calcium: 8.8 mg/dL — ABNORMAL LOW (ref 8.9–10.3)
Calcium: 8.7 mg/dL — ABNORMAL LOW (ref 8.9–10.3)
Creatinine, Ser: 5.51 mg/dL — ABNORMAL HIGH (ref 0.44–1.00)
Creatinine, Ser: 5.43 mg/dL — ABNORMAL HIGH (ref 0.44–1.00)
Creatinine, Ser: 5.58 mg/dL — ABNORMAL HIGH (ref 0.44–1.00)
GFR calc Af Amer: 9 mL/min — ABNORMAL LOW (ref >60)
GFR calc Af Amer: 9 mL/min — ABNORMAL LOW (ref >60)
GFR calc Af Amer: 9 mL/min — ABNORMAL LOW (ref >60)
GFR calc non Af Amer: 8 mL/min — ABNORMAL LOW (ref >60)
GFR calc non Af Amer: 7 mL/min — ABNORMAL LOW (ref >60)
GFR, EST NON AFRICAN AMERICAN: 7 mL/min — AB (ref >60)
GLUCOSE: 222 mg/dL — AB (ref 65–99)
GLUCOSE: 279 mg/dL — AB (ref 65–99)
GLUCOSE: 218 mg/dL — AB (ref 65–99)
POTASSIUM: 5.8 mmol/L — AB (ref 3.5–5.1)
POTASSIUM: 5 mmol/L (ref 3.5–5.1)
POTASSIUM: 4.8 mmol/L (ref 3.5–5.1)
Sodium: 136 mmol/L (ref 135–145)
Sodium: 135 mmol/L (ref 135–145)
Sodium: 140 mmol/L (ref 135–145)

## 2015-08-04 LAB — CBC
HCT: 27.4 % — ABNORMAL LOW (ref 36.0–46.0)
HEMOGLOBIN: 8.7 g/dL — AB (ref 12.0–15.0)
MCH: 27.4 pg (ref 26.0–34.0)
MCHC: 31.8 g/dL (ref 30.0–36.0)
MCV: 86.4 fL (ref 78.0–100.0)
Platelets: 313 10*3/uL (ref 150–400)
RBC: 3.17 MIL/uL — AB (ref 3.87–5.11)
RDW: 15.2 % (ref 11.5–15.5)
WBC: 11.8 10*3/uL — ABNORMAL HIGH (ref 4.0–10.5)

## 2015-08-04 LAB — URINALYSIS, ROUTINE W REFLEX MICROSCOPIC
Bilirubin Urine: NEGATIVE
GLUCOSE, UA: 250 mg/dL — AB
Ketones, ur: NEGATIVE mg/dL
Nitrite: NEGATIVE
PH: 6 (ref 5.0–8.0)
Protein, ur: >300 mg/dL — AB
Specific Gravity, Urine: 1.017 (ref 1.005–1.030)

## 2015-08-04 LAB — TSH: TSH: 3.47 u[IU]/mL (ref 0.350–4.500)

## 2015-08-04 LAB — TROPONIN I
TROPONIN I: 0.06 ng/mL — AB (ref <0.031)
Troponin I: 0.07 ng/mL — ABNORMAL HIGH (ref <0.031)

## 2015-08-04 LAB — IRON AND TIBC
IRON: 52 ug/dL (ref 28–170)
SATURATION RATIOS: 21 % (ref 10.4–31.8)
TIBC: 253 ug/dL (ref 250–450)
UIBC: 201 ug/dL

## 2015-08-04 LAB — PHOSPHORUS: Phosphorus: 7 mg/dL — ABNORMAL HIGH (ref 2.5–4.6)

## 2015-08-04 LAB — URINE MICROSCOPIC-ADD ON

## 2015-08-04 LAB — I-STAT CG4 LACTIC ACID, ED: LACTIC ACID, VENOUS: 1.24 mmol/L (ref 0.5–2.0)

## 2015-08-04 LAB — GLUCOSE, CAPILLARY
GLUCOSE-CAPILLARY: 177 mg/dL — AB (ref 65–99)
GLUCOSE-CAPILLARY: 183 mg/dL — AB (ref 65–99)
Glucose-Capillary: 205 mg/dL — ABNORMAL HIGH (ref 65–99)
Glucose-Capillary: 211 mg/dL — ABNORMAL HIGH (ref 65–99)
Glucose-Capillary: 168 mg/dL — ABNORMAL HIGH (ref 65–99)

## 2015-08-04 LAB — MAGNESIUM: Magnesium: 2.4 mg/dL (ref 1.7–2.4)

## 2015-08-04 LAB — PROCALCITONIN
PROCALCITONIN: 1.09 ng/mL
Procalcitonin: 1.13 ng/mL

## 2015-08-04 LAB — FERRITIN: FERRITIN: 325 ng/mL — AB (ref 11–307)

## 2015-08-04 LAB — MRSA PCR SCREENING: MRSA by PCR: NEGATIVE

## 2015-08-04 MED ORDER — FAMOTIDINE IN NACL 20-0.9 MG/50ML-% IV SOLN: 20.0000 mg | Freq: Two times a day (BID) | INTRAVENOUS | Status: DC

## 2015-08-04 MED ORDER — INSULIN ASPART 100 UNIT/ML ~~LOC~~ SOLN
2.0000 [IU] | SUBCUTANEOUS | Status: DC
  Administered 2015-08-04 (×2): 4 [IU] via SUBCUTANEOUS
  Administered 2015-08-04: 6 [IU] via SUBCUTANEOUS
  Administered 2015-08-04 (×2): 4 [IU] via SUBCUTANEOUS
  Administered 2015-08-05: 2 [IU] via SUBCUTANEOUS
  Administered 2015-08-05 (×2): 4 [IU] via SUBCUTANEOUS
  Administered 2015-08-06 (×2): 2 [IU] via SUBCUTANEOUS

## 2015-08-04 MED ORDER — AMLODIPINE 1 MG/ML ORAL SUSPENSION
10.0000 mg | Freq: Every day | ORAL | Status: DC
  Administered 2015-08-04 – 2015-08-06 (×3): 10 mg via ORAL
  Filled 2015-08-04 (×5): qty 10

## 2015-08-04 MED ORDER — PANTOPRAZOLE SODIUM 40 MG IV SOLR
40.0000 mg | INTRAVENOUS | Status: DC
  Administered 2015-08-04 – 2015-08-06 (×3): 40 mg via INTRAVENOUS
  Filled 2015-08-04 (×4): qty 40

## 2015-08-04 MED ORDER — HEPARIN SODIUM (PORCINE) 5000 UNIT/ML IJ SOLN
5000.0000 [IU] | Freq: Three times a day (TID) | INTRAMUSCULAR | Status: DC
  Administered 2015-08-04 – 2015-08-08 (×13): 5000 [IU] via SUBCUTANEOUS
  Filled 2015-08-04 (×15): qty 1

## 2015-08-04 MED ORDER — METOPROLOL TARTRATE 25 MG/10 ML ORAL SUSPENSION
50.0000 mg | Freq: Two times a day (BID) | ORAL | Status: DC
  Administered 2015-08-04 – 2015-08-06 (×5): 50 mg via ORAL
  Filled 2015-08-04 (×6): qty 20

## 2015-08-04 MED ORDER — SODIUM CHLORIDE 0.9 % IV SOLN
INTRAVENOUS | Status: DC
  Administered 2015-08-04 – 2015-08-08 (×2): via INTRAVENOUS

## 2015-08-04 MED ORDER — LEVOTHYROXINE SODIUM 100 MCG IV SOLR
75.0000 ug | Freq: Every day | INTRAVENOUS | Status: DC
  Administered 2015-08-04 – 2015-08-06 (×3): 75 ug via INTRAVENOUS
  Filled 2015-08-04 (×3): qty 5

## 2015-08-04 MED ORDER — METOPROLOL TARTRATE 1 MG/ML IV SOLN: 2.5000 mg | INTRAVENOUS | Status: DC | PRN

## 2015-08-04 MED ORDER — SODIUM CHLORIDE 0.9 % IV SOLN: 250.0000 mL | INTRAVENOUS | Status: DC | PRN

## 2015-08-04 MED ORDER — NITROGLYCERIN IN D5W 200-5 MCG/ML-% IV SOLN
0.0000 ug/min | INTRAVENOUS | Status: DC
  Administered 2015-08-04: 5 ug/min via INTRAVENOUS
  Filled 2015-08-04: qty 250

## 2015-08-04 MED ORDER — SODIUM POLYSTYRENE SULFONATE 15 GM/60ML PO SUSP
30.0000 g | Freq: Once | ORAL | Status: AC
  Administered 2015-08-04: 30 g via ORAL
  Filled 2015-08-04: qty 120

## 2015-08-04 MED ORDER — ASPIRIN 81 MG PO CHEW
81.0000 mg | CHEWABLE_TABLET | Freq: Every day | ORAL | Status: DC
  Administered 2015-08-04 – 2015-08-08 (×5): 81 mg via ORAL
  Filled 2015-08-04 (×5): qty 1

## 2015-08-04 NOTE — Progress Notes (Signed)
S: Breathing a little better O:BP 170/87 mmHg  Pulse 77  Temp(Src) 98.3 F (36.8 C) (Axillary)  Resp 19  Ht 5\' 4"  (1.626 m)  Wt 106.641 kg (235 lb 1.6 oz)  BMI 40.34 kg/m2  SpO2 99%  Intake/Output Summary (Last 24 hours) at 08/04/15 1143 Last data filed at 08/04/15 0700  Gross per 24 hour  Intake    116 ml  Output    550 ml  Net   -434 ml   Weight change:  [id number] and alert.  Bipap mask in place CVS:RRR Resp: Bilateral crackles Abd:+ BS NTND Ext: 0-tr edema   Lt AVF small + bruit NEURO: CNI Ox3 no asterixis   . amLODipine  10 mg Oral Daily  . aspirin  81 mg Oral Daily  . furosemide  160 mg Intravenous Q6H  . heparin  5,000 Units Subcutaneous 3 times per day  . insulin aspart  2-6 Units Subcutaneous 6 times per day  . levothyroxine  75 mcg Intravenous Daily  . metoprolol tartrate  50 mg Oral BID  . pantoprazole (PROTONIX) IV  40 mg Intravenous Q24H  . sodium polystyrene  30 g Oral Once   Dg Chest Portable 1 View  08/03/2015  CLINICAL DATA:  64 year old female with shortness of breath EXAM: PORTABLE CHEST 1 VIEW COMPARISON:  Chest radiograph dated 01/06/2013 FINDINGS: Single portable view of the chest demonstrate cardiomegaly with bilateral interstitial and central vascular prominence most compatible with congestive changes. Superimposed pneumonia is not excluded. Small bilateral pleural effusions may be present. There is no pneumothorax. The osseous structures are grossly unremarkable. IMPRESSION: Cardiomegaly with congestive changes. Clinical correlation and follow-up recommended. Electronically Signed   By: Anner Crete M.D.   On: 08/03/2015 21:58   BMET    Component Value Date/Time   NA 135 08/04/2015 0908   K 5.0 08/04/2015 0908   CL 99* 08/04/2015 0908   CO2 21* 08/04/2015 0908   GLUCOSE 279* 08/04/2015 0908   BUN 82* 08/04/2015 0908   CREATININE 5.43* 08/04/2015 0908   CALCIUM 8.8* 08/04/2015 0908   GFRNONAA 8* 08/04/2015 0908   GFRAA 9* 08/04/2015 0908    CBC    Component Value Date/Time   WBC 11.8* 08/04/2015 0307   RBC 3.17* 08/04/2015 0307   HGB 8.7* 08/04/2015 0307   HCT 27.4* 08/04/2015 0307   PLT 313 08/04/2015 0307   MCV 86.4 08/04/2015 0307   MCH 27.4 08/04/2015 0307   MCHC 31.8 08/04/2015 0307   RDW 15.2 08/04/2015 0307   LYMPHSABS 2.0 07/03/2012 1230   MONOABS 0.5 07/03/2012 1230   EOSABS 0.2 07/03/2012 1230   BASOSABS 0.0 07/03/2012 1230     Assessment: 1. Acute on CKD 4 Scr was 3.55 in Oct '16 2. Pulm edema 3. Hyperkalemia, improved 4. Anemia 5. Ca of tongue  Plan: 1.  UO is starting to pick up so will see how this goes before jumping into HD 2. Start aranesp 3. Check PTH 4. Daily Scr 5. Check iron studies   Neila Teem T

## 2015-08-04 NOTE — Progress Notes (Signed)
Removed patient from BiPAP for 30 minutes due to patient being uncomfortable on mask. Placed her on 5L nasal cannula and she is tolerating it well. SpO2 of 96% HR and RR within normal limits. RT will place patient back on BiPAP at 2030 and will continue to monitor.

## 2015-08-04 NOTE — Progress Notes (Signed)
Placed pt. Back on BIPAP due to frequent periods of apnea with desaturations. Pt. States that she wears CPAP at home.

## 2015-08-04 NOTE — Progress Notes (Signed)
Utilization review completed.  

## 2015-08-04 NOTE — Progress Notes (Signed)
Inpatient Diabetes Program Recommendations  AACE/ADA: New Consensus Statement on Inpatient Glycemic Control (2015)  Target Ranges:  Prepandial:   less than 140 mg/dL      Peak postprandial:   less than 180 mg/dL (1-2 hours)      Critically ill patients:  140 - 180 mg/dL   Review of Glycemic Control  Inpatient Diabetes Program Recommendations:  Insulin - Basal: add a portion of patient's home dose Lantus  Thank you  Raoul Pitch BSN, RN,CDE Inpatient Diabetes Coordinator [phone] (team pager)

## 2015-08-04 NOTE — Progress Notes (Signed)
Placed patient on BiPAP after thirty min break. Patient is tolerating well and RT will continue to monitor.

## 2015-08-04 NOTE — Progress Notes (Signed)
PULMONARY / CRITICAL CARE MEDICINE   Name: Tami Elliott MRN: [id number] DOB: [birth year]    ADMISSION DATE:  08/03/2015 CONSULTATION DATE:  08/03/2015  REFERRING MD:  Dr. Wyvonnia Dusky EDP  CHIEF COMPLAINT:  SOB  HISTORY OF PRESENT ILLNESS:   64 year old female with a past medical history as below, which includes diabetes, chronic kidney disease, tongue cancer, hypothyroidism, and OSA. She is on home O2. She was recently admitted to [facility] for tongue surgery in the setting of cancer which was supposed be followed up with radiation therapy. The surgery was canceled, however, due to dyspnea. Admitted for resp failure, BIPAP. lasix   SUBJECTIVE:  Lasix given, BIPAP still required  VITAL SIGNS: BP 167/83 mmHg  Pulse 76  Temp(Src) 98.3 F (36.8 C) (Axillary)  Resp 23  Ht 5\' 4"  (1.626 m)  Wt 235 lb 1.6 oz (106.641 kg)  BMI 40.34 kg/m2  SpO2 95%  HEMODYNAMICS:    VENTILATOR SETTINGS: Vent Mode:  [-] BIPAP;PCV FiO2 (%):  [45 %-100 %] 45 % Set Rate:  [15 bmp-18 bmp] 18 bmp PEEP:  [5 cmH20-8 cmH20] 8 cmH20  INTAKE / OUTPUT: I/O last 3 completed shifts: In: 116 [I.V.:50; IV Piggyback:66] Out: 550 [Urine:550]  PHYSICAL EXAMINATION: General:  Obese woman in NAD on BiPAP Neuro:  Alert, oriented, non-focal HEENT:  PERRL. Tongue mass CV:  RRR, no m/g/r Pulm: Coarse improved Abdomen: BS+, soft, obese, non-tender, non-distended Ext: warm, no peripheral edema  Skin:  Grossly intact. Chronic venous changes to BLE  LABS:  BMET  Recent Labs Lab 08/03/15 2201 08/03/15 2210 08/04/15 0307  NA 136 135 136  K 5.9* 5.6* 5.8*  CL 101 103 102  CO2 23  --  21*  BUN 77* 89* 82*  CREATININE 5.54* 5.10* 5.51*  GLUCOSE 224* 220* 222*    Electrolytes  Recent Labs Lab 08/03/15 2201 08/04/15 0307  CALCIUM 8.6* 9.0  MG  --  2.4  PHOS  --  7.0*    CBC  Recent Labs Lab 08/03/15 2210 08/04/15 0307  WBC  --  11.8*  HGB 9.9* 8.7*  HCT 29.0* 27.4*  PLT  --   313    Coag's  Recent Labs Lab 08/03/15 2201  INR 1.15    Sepsis Markers  Recent Labs Lab 08/03/15 2210 08/04/15 0025 08/04/15 0307  LATICACIDVEN 1.27 1.24  --   PROCALCITON  --   --  1.09    ABG  Recent Labs Lab 08/03/15 2150 08/03/15 2348  PHART 7.279* 7.268*  PCO2ART 50.3* 51.3*  PO2ART 75.0* 58.0*    Liver Enzymes  Recent Labs Lab 08/03/15 2201  AST 26  ALT 21  ALKPHOS 119  BILITOT 0.9  ALBUMIN 3.3*    Cardiac Enzymes  Recent Labs Lab 08/03/15 2201 08/04/15 0307 08/04/15 0624  TROPONINI 0.06* 0.07* 0.06*    Glucose  Recent Labs Lab 08/04/15 0241 08/04/15 0758  GLUCAP 177* 205*    Imaging Dg Chest Portable 1 View  08/03/2015  CLINICAL DATA:  64 year old female with shortness of breath EXAM: PORTABLE CHEST 1 VIEW COMPARISON:  Chest radiograph dated 01/06/2013 FINDINGS: Single portable view of the chest demonstrate cardiomegaly with bilateral interstitial and central vascular prominence most compatible with congestive changes. Superimposed pneumonia is not excluded. Small bilateral pleural effusions may be present. There is no pneumothorax. The osseous structures are grossly unremarkable. IMPRESSION: Cardiomegaly with congestive changes. Clinical correlation and follow-up recommended. Electronically Signed   By: Anner Crete M.D.   On: 08/03/2015  21:58     STUDIES:  12/9 echocardiogram > LV EF 0000000, no diastolic dysfunction  CULTURES: None  ANTIBIOTICS: None  SIGNIFICANT EVENTS: 12/8 admit to [facility] for tongue surgery, which was cancelled due to dypsnea 12/10 discharged from [facility] 12/19 admit to [facility] for AonCKD, volume overload 12/20 - remains on bipap, neg 444 cc  LINES/TUBES:   DISCUSSION: 64 year old female with past medical history of chronic kidney disease, squamous cell carcinoma of the tongue, and hypertension admitted to [facility] 12/19 for pulmonary edema secondary to volume overload in the setting of chronic  kidney disease. Plan is to continue BiPAP for ventilatory support oxygenation. Appreciate nephrology input and will continue to diurese aggressively.  Suspect this will escalate to requiring hemodialysis. Will monitor serial Bmet and correct electrolytes as indicated.  ASSESSMENT / PLAN:  PULMONARY A: Acute hypoxemic/hypercarbic respiratory failure Pulmonary edema secondary to volume overload OSA  P:   Continue BiPAP, requires 16/8, volume sar eokay ABG reviewed, continue to use scheduled NIMV Awaiting AM pCXR Neg balance goals remain May need to [place] chest, effusion layering? pcxr in am  Control BP  CARDIOVASCULAR A:  Hypertension Chest pain Echo at [facility] 12/9 w/ normal EF 55-60%, mild pulm HTN (43 mmHg)  P:  Tele Restart home amlodipine, metoprolol to control afterload Metoprolol IV PRN Goal sys 140 May benefit from nitro drip   RENAL A:   Acute on chronic kidney disease - baseline = SCr 3 (LUE AVF placed 05/12/15, was being considered for transplant until recent cancer diagnosis) Volume overload  Hyperkalemia  Metabolic Acidosis  P:   Nephrology following  May need HD Cr mildly bumped likely secondary to lasix  Strict I&O, follow urine output Chem in afternoon Per renal May need bicarb for K  Dose kayxlate  bmet to q8h  GASTROINTESTINAL A:   GERD   P:  NPO while on BiPAP  Continue home ranitidine Get lft ina m   HEMATOLOGIC A:   Anemia of chronic illness Tongue Cancer, squamous cell  Leukocytosis- reactive  P:  CBC in AM Heparin for VTE ppx Oncology plan for resection of mass followed by radiation  INFECTIOUS A:   No acute issues  P:   PCT may help further  ENDOCRINE A:   DM Hypothyroid    P:   SSI TSH normal  NEUROLOGIC A:   NIMV  P:   Monitor , may need versed   FAMILY  - Updates: pt updated 12/20, no family   - Inter-disciplinary family meet or Palliative Care meeting due by:  12/26  Albin Felling, MD, MPH Internal  Medicine Resident, PGY-II Pager: [phone] 08/04/2015 8:30 AM     STAFF NOTE: Linwood Dibbles, MD FACP have personally reviewed patient's available data, including medical history, events of note, physical examination and test results as part of my evaluation. I have discussed with resident/NP and other care providers such as pharmacist, RN and RRT. In addition, I personally evaluated patient and elicited key findings of: remains overloaded, PH reviewed, keep on NIMV and schedule it 4 hr on 30 min off mandatory, if declines requires ett, lasix to neg balance per renal, bmets to q8h, pcxr in am, may [place] chest assess for effusions layering, k concerning treat kay, may need bicarb drip, likley can reduce epap to increase TV as ph noted, no feeding with resp status, control afterload better, bb, add NTG drip to reduce preload and afterload, remain in icu The patient is critically ill with multiple  organ systems failure and requires high complexity decision making for assessment and support, frequent evaluation and titration of therapies, application of advanced monitoring technologies and extensive interpretation of multiple databases.   Critical Care Time devoted to patient care services described in this note is 30 Minutes. This time reflects time of care of this signee: Merrie Roof, MD FACP. This critical care time does not reflect procedure time, or teaching time or supervisory time of PA/NP/Med student/Med Resident etc but could involve care discussion time. Rest per NP/medical resident whose note is outlined above and that I agree with   Lavon Paganini. Titus Mould, MD, [place] Pgr: [facility] 08/04/2015 9:59 AM

## 2015-08-05 ENCOUNTER — Inpatient Hospital Stay (HOSPITAL_COMMUNITY): Payer: Medicare Other

## 2015-08-05 ENCOUNTER — Other Ambulatory Visit (HOSPITAL_COMMUNITY): Payer: Self-pay

## 2015-08-05 DIAGNOSIS — N186 End stage renal disease: Secondary | ICD-10-CM

## 2015-08-05 DIAGNOSIS — J810 Acute pulmonary edema: Secondary | ICD-10-CM

## 2015-08-05 LAB — BASIC METABOLIC PANEL
Anion gap: 14 (ref 5–15)
BUN: 96 mg/dL — AB (ref 6–20)
CHLORIDE: 101 mmol/L (ref 101–111)
CO2: 24 mmol/L (ref 22–32)
CREATININE: 5.66 mg/dL — AB (ref 0.44–1.00)
Calcium: 8.7 mg/dL — ABNORMAL LOW (ref 8.9–10.3)
GFR calc Af Amer: 8 mL/min — ABNORMAL LOW (ref >60)
GFR calc non Af Amer: 7 mL/min — ABNORMAL LOW (ref >60)
Glucose, Bld: 143 mg/dL — ABNORMAL HIGH (ref 65–99)
Potassium: 3.6 mmol/L (ref 3.5–5.1)
SODIUM: 139 mmol/L (ref 135–145)

## 2015-08-05 LAB — CBC WITH DIFFERENTIAL/PLATELET
BASOS ABS: 0 10*3/uL (ref 0.0–0.1)
Basophils Relative: 0 %
EOS PCT: 0 %
Eosinophils Absolute: 0 10*3/uL (ref 0.0–0.7)
HCT: 25.2 % — ABNORMAL LOW (ref 36.0–46.0)
Hemoglobin: 8.5 g/dL — ABNORMAL LOW (ref 12.0–15.0)
LYMPHS PCT: 7 %
Lymphs Abs: 0.7 10*3/uL (ref 0.7–4.0)
MCH: 28.9 pg (ref 26.0–34.0)
MCHC: 33.7 g/dL (ref 30.0–36.0)
MCV: 85.7 fL (ref 78.0–100.0)
MONO ABS: 0.9 10*3/uL (ref 0.1–1.0)
MONOS PCT: 8 %
Neutro Abs: 9.7 10*3/uL — ABNORMAL HIGH (ref 1.7–7.7)
Neutrophils Relative %: 85 %
PLATELETS: 336 10*3/uL (ref 150–400)
RBC: 2.94 MIL/uL — ABNORMAL LOW (ref 3.87–5.11)
RDW: 15.5 % (ref 11.5–15.5)
WBC: 11.3 10*3/uL — ABNORMAL HIGH (ref 4.0–10.5)

## 2015-08-05 LAB — BLOOD GAS, ARTERIAL
ACID-BASE EXCESS: 0.4 mmol/L (ref 0.0–2.0)
Bicarbonate: 24.8 mEq/L — ABNORMAL HIGH (ref 20.0–24.0)
DELIVERY SYSTEMS: POSITIVE
Drawn by: 252031
EXPIRATORY PAP: 6
FIO2: 0.4
INSPIRATORY PAP: 12
O2 SAT: 93.3 %
PCO2 ART: 42.3 mmHg (ref 35.0–45.0)
PO2 ART: 67.8 mmHg — AB (ref 80.0–100.0)
Patient temperature: 98.6
TCO2: 26.1 mmol/L (ref 0–100)
pH, Arterial: 7.386 (ref 7.350–7.450)

## 2015-08-05 LAB — GLUCOSE, CAPILLARY
GLUCOSE-CAPILLARY: 119 mg/dL — AB (ref 65–99)
GLUCOSE-CAPILLARY: 121 mg/dL — AB (ref 65–99)
GLUCOSE-CAPILLARY: 160 mg/dL — AB (ref 65–99)
GLUCOSE-CAPILLARY: 162 mg/dL — AB (ref 65–99)
GLUCOSE-CAPILLARY: 170 mg/dL — AB (ref 65–99)
Glucose-Capillary: 117 mg/dL — ABNORMAL HIGH (ref 65–99)
Glucose-Capillary: 159 mg/dL — ABNORMAL HIGH (ref 65–99)

## 2015-08-05 LAB — PHOSPHORUS: Phosphorus: 6.8 mg/dL — ABNORMAL HIGH (ref 2.5–4.6)

## 2015-08-05 LAB — PROCALCITONIN: Procalcitonin: 0.95 ng/mL

## 2015-08-05 MED ORDER — CHLORHEXIDINE GLUCONATE 0.12 % MT SOLN
15.0000 mL | Freq: Two times a day (BID) | OROMUCOSAL | Status: DC
  Administered 2015-08-05 – 2015-08-08 (×6): 15 mL via OROMUCOSAL
  Filled 2015-08-05 (×6): qty 15

## 2015-08-05 MED ORDER — DARBEPOETIN ALFA 100 MCG/0.5ML IJ SOSY
100.0000 ug | PREFILLED_SYRINGE | INTRAMUSCULAR | Status: DC
  Administered 2015-08-05: 100 ug via SUBCUTANEOUS
  Filled 2015-08-05: qty 0.5

## 2015-08-05 MED ORDER — CETYLPYRIDINIUM CHLORIDE 0.05 % MT LIQD
7.0000 mL | Freq: Two times a day (BID) | OROMUCOSAL | Status: DC
Start: 2015-08-05 — End: 2015-08-08
  Administered 2015-08-05 – 2015-08-08 (×5): 7 mL via OROMUCOSAL

## 2015-08-05 MED ORDER — DULOXETINE HCL 30 MG PO CPEP
30.0000 mg | ORAL_CAPSULE | Freq: Every day | ORAL | Status: DC
  Administered 2015-08-05 – 2015-08-08 (×4): 30 mg via ORAL
  Filled 2015-08-05 (×4): qty 1

## 2015-08-05 MED ORDER — SODIUM CHLORIDE 0.9 % IV SOLN
510.0000 mg | Freq: Once | INTRAVENOUS | Status: AC
  Administered 2015-08-05: 510 mg via INTRAVENOUS
  Filled 2015-08-05: qty 17

## 2015-08-05 MED ORDER — DULOXETINE HCL 60 MG PO CPEP
60.0000 mg | ORAL_CAPSULE | Freq: Every day | ORAL | Status: DC
  Administered 2015-08-05 – 2015-08-07 (×3): 60 mg via ORAL
  Filled 2015-08-05 (×4): qty 1

## 2015-08-05 NOTE — Progress Notes (Signed)
S: Breathing better, off BiPap O:BP 159/89 mmHg  Pulse 67  Temp(Src) 97.8 F (36.6 C) (Oral)  Resp 23  Ht 5\' 4"  (1.626 m)  Wt 116.3 kg (256 lb 6.3 oz)  BMI 43.99 kg/m2  SpO2 98%  Intake/Output Summary (Last 24 hours) at 08/05/15 0744 Last data filed at 08/05/15 0600  Gross per 24 hour  Intake 415.83 ml  Output   2720 ml  Net -2304.17 ml   Weight change: 9.659 kg (21 lb 4.7 oz) [id number] and alert.   Mouth:  Mass on Lt side of tongue CVS:RRR Resp: Bilateral crackles Abd:+ BS NTND Ext: 0-tr edema   Lt AVF small + bruit NEURO: CNI Ox3 no asterixis   . amLODipine  10 mg Oral Daily  . antiseptic oral rinse  7 mL Mouth Rinse q12n4p  . aspirin  81 mg Oral Daily  . chlorhexidine  15 mL Mouth Rinse BID  . furosemide  160 mg Intravenous Q6H  . heparin  5,000 Units Subcutaneous 3 times per day  . insulin aspart  2-6 Units Subcutaneous 6 times per day  . levothyroxine  75 mcg Intravenous Daily  . metoprolol tartrate  50 mg Oral BID  . pantoprazole (PROTONIX) IV  40 mg Intravenous Q24H   Dg Chest Port 1 View  08/05/2015  CLINICAL DATA:  Pulmonary edema.  Shortness of breath. EXAM: PORTABLE CHEST 1 VIEW COMPARISON:  08/03/2015. FINDINGS: Cardiomegaly with bilateral pulmonary infiltrates consistent with pulmonary edema. Bilateral pleural effusions. No pneumothorax. No acute osseous abnormality . IMPRESSION: Congestive heart failure with prominent bilateral pulmonary edema and moderate bilateral effusions. Similar findings noted on prior exam. Electronically Signed   By: Marcello Moores  Register   On: 08/05/2015 07:25   Dg Chest Portable 1 View  08/03/2015  CLINICAL DATA:  64 year old female with shortness of breath EXAM: PORTABLE CHEST 1 VIEW COMPARISON:  Chest radiograph dated 01/06/2013 FINDINGS: Single portable view of the chest demonstrate cardiomegaly with bilateral interstitial and central vascular prominence most compatible with congestive changes. Superimposed pneumonia is not excluded.  Small bilateral pleural effusions may be present. There is no pneumothorax. The osseous structures are grossly unremarkable. IMPRESSION: Cardiomegaly with congestive changes. Clinical correlation and follow-up recommended. Electronically Signed   By: Anner Crete M.D.   On: 08/03/2015 21:58   BMET    Component Value Date/Time   NA 139 08/05/2015 0338   K 3.6 08/05/2015 0338   CL 101 08/05/2015 0338   CO2 24 08/05/2015 0338   GLUCOSE 143* 08/05/2015 0338   BUN 96* 08/05/2015 0338   CREATININE 5.66* 08/05/2015 0338   CALCIUM 8.7* 08/05/2015 0338   GFRNONAA 7* 08/05/2015 0338   GFRAA 8* 08/05/2015 0338   CBC    Component Value Date/Time   WBC 11.3* 08/05/2015 0338   RBC 2.94* 08/05/2015 0338   HGB 8.5* 08/05/2015 0338   HCT 25.2* 08/05/2015 0338   PLT 336 08/05/2015 0338   MCV 85.7 08/05/2015 0338   MCH 28.9 08/05/2015 0338   MCHC 33.7 08/05/2015 0338   RDW 15.5 08/05/2015 0338   LYMPHSABS 0.7 08/05/2015 0338   MONOABS 0.9 08/05/2015 0338   EOSABS 0.0 08/05/2015 0338   BASOSABS 0.0 08/05/2015 0338     Assessment: 1. Acute on CKD 4 Scr was 3.55 in Oct '16.  UO good, Scr appears to be leveling off 2. Pulm edema 3. Hyperkalemia, resolved 4. Anemia 5. Ca of tongue  Plan: 1.  UO improved and breathing better.  Will see if renal  fx improves.  Discussed the role of HD if renal fx continues to worsen 2.  Change labs to once a day 3. Cont with lasix 4. Start aranespm and give 1 dose feraheme   Sueo Cullen T

## 2015-08-05 NOTE — Progress Notes (Signed)
PULMONARY / CRITICAL CARE MEDICINE   Name: Tami Elliott MRN: [id number] DOB: 11-01-1950    ADMISSION DATE:  08/03/2015 CONSULTATION DATE:  08/03/2015  REFERRING MD:  Dr. Wyvonnia Dusky EDP  CHIEF COMPLAINT:  SOB  HISTORY OF PRESENT ILLNESS:   64 year old female with a past medical history as below, which includes diabetes, chronic kidney disease, tongue cancer, hypothyroidism, and OSA. She is on home O2. She was recently admitted to [facility] for tongue surgery in the setting of cancer which was supposed be followed up with radiation therapy. The surgery was canceled, however, due to dyspnea. Admitted for resp failure, BIPAP. lasix   SUBJECTIVE:  Good output, off nimv  VITAL SIGNS: BP 133/66 mmHg  Pulse 63  Temp(Src) 97.7 F (36.5 C) (Oral)  Resp 24  Ht 5\' 4"  (1.626 m)  Wt 116.3 kg (256 lb 6.3 oz)  BMI 43.99 kg/m2  SpO2 96%  HEMODYNAMICS:    VENTILATOR SETTINGS: Vent Mode:  [-] BIPAP FiO2 (%):  [5 %-45 %] 40 % Set Rate:  [18 bmp] 18 bmp PEEP:  [6 cmH20-8 cmH20] 6 cmH20 Pressure Support:  [6 cmH20] 6 cmH20  INTAKE / OUTPUT: I/O last 3 completed shifts: In: 531.8 [I.V.:333.8; IV Piggyback:198] Out: [id number] [Urine:3270]  PHYSICAL EXAMINATION: General:  Obese woman in No distress Neuro:  Alert, oriented, non-focal HEENT:  PERRL. Tongue mass CV:  RRR, no m/g/r Pulm: Coarse resolved Abdomen: BS+, soft, obese, non-tender, non-distended Ext: warm, no peripheral edema  Skin:  Grossly intact. Chronic venous changes to BLE  LABS:  BMET  Recent Labs Lab 08/04/15 0908 08/04/15 1252 08/05/15 0338  NA 135 140 139  K 5.0 4.8 3.6  CL 99* 101 101  CO2 21* 23 24  BUN 82* 85* 96*  CREATININE 5.43* 5.58* 5.66*  GLUCOSE 279* 218* 143*    Electrolytes  Recent Labs Lab 08/04/15 0307 08/04/15 0908 08/04/15 1252 08/05/15 0338  CALCIUM 9.0 8.8* 8.7* 8.7*  MG 2.4  --   --   --   PHOS 7.0*  --   --  6.8*    CBC  Recent Labs Lab 08/03/15 2210  08/04/15 0307 08/05/15 0338  WBC  --  11.8* 11.3*  HGB 9.9* 8.7* 8.5*  HCT 29.0* 27.4* 25.2*  PLT  --  313 336    Coag's  Recent Labs Lab 08/03/15 2201  INR 1.15    Sepsis Markers  Recent Labs Lab 08/03/15 2210 08/04/15 0025 08/04/15 0307 08/04/15 0908 08/05/15 0338  LATICACIDVEN 1.27 1.24  --   --   --   PROCALCITON  --   --  1.09 1.13 0.95    ABG  Recent Labs Lab 08/03/15 2150 08/03/15 2348 08/05/15 0401  PHART 7.279* 7.268* 7.386  PCO2ART 50.3* 51.3* 42.3  PO2ART 75.0* 58.0* 67.8*    Liver Enzymes  Recent Labs Lab 08/03/15 2201  AST 26  ALT 21  ALKPHOS 119  BILITOT 0.9  ALBUMIN 3.3*    Cardiac Enzymes  Recent Labs Lab 08/03/15 2201 08/04/15 0307 08/04/15 0624  TROPONINI 0.06* 0.07* 0.06*    Glucose  Recent Labs Lab 08/04/15 1129 08/04/15 1544 08/04/15 1951 08/04/15 2346 08/05/15 0408 08/05/15 0800  GLUCAP 183* 211* 168* 162* 121* 119*    Imaging Dg Chest Port 1 View  08/05/2015  CLINICAL DATA:  Pulmonary edema.  Shortness of breath. EXAM: PORTABLE CHEST 1 VIEW COMPARISON:  08/03/2015. FINDINGS: Cardiomegaly with bilateral pulmonary infiltrates consistent with pulmonary edema. Bilateral pleural effusions. No pneumothorax. No  acute osseous abnormality . IMPRESSION: Congestive heart failure with prominent bilateral pulmonary edema and moderate bilateral effusions. Similar findings noted on prior exam. Electronically Signed   By: [place]   On: 08/05/2015 07:25     STUDIES:  12/9 echocardiogram > LV EF 0000000, no diastolic dysfunction  CULTURES: None  ANTIBIOTICS: None  SIGNIFICANT EVENTS: 12/8 admit to [facility] for tongue surgery, which was cancelled due to dypsnea 12/10 discharged from [facility] 12/19 admit to [facility] for AonCKD, volume overload 12/20 - remains on bipap, neg 444 cc 12/21- neg 2.3 liters, off bipap  LINES/TUBES:   DISCUSSION: 64 year old female with past medical history of chronic kidney disease, squamous  cell carcinoma of the tongue, and hypertension admitted to [facility] 12/19 for pulmonary edema secondary to volume overload in the setting of chronic kidney disease. Plan is to continue BiPAP for ventilatory support oxygenation. Appreciate nephrology input and will continue to diurese aggressively.  Suspect this will escalate to requiring hemodialysis. Will monitor serial Bmet and correct electrolytes as indicated.  ASSESSMENT / PLAN:  PULMONARY A: Acute hypoxemic/hypercarbic respiratory failure Pulmonary edema secondary to volume overload OSA Concern effusions  P:   Dc bipap O2 as needed Neg balance goals remain, had success last 24 hrs will [place] chest today pcxr in am  Control BP, successful abg wnl  CARDIOVASCULAR A:  Hypertension Chest pain Echo at [facility] 12/9 w/ normal EF 55-60%, mild pulm HTN (43 mmHg)  P:  Tele amlodipine, metoprolol to control afterload Metoprolol IV PRN Goal sys 140 Dc NTG drip as clinically improved  RENAL A:   Acute on chronic kidney disease - baseline = SCr 3 (LUE AVF placed 05/12/15, was being considered for transplant until recent cancer diagnosis) Volume overload  Hyperkalemia  Metabolic Acidosis  P:   Nephrology following  May still need hd but MAJOR  Improved bmet to am  Lasix per renal  GASTROINTESTINAL A:   GERD   P:  Advance diet Continue home ranitidine  HEMATOLOGIC A:   Anemia of chronic illness Tongue Cancer, squamous cell  Leukocytosis- reactive  P:  Heparin for VTE ppx Oncology plan for resection of mass followed by radiation  INFECTIOUS A:   No acute issues  P:   Monitor temp  ENDOCRINE A:   DM Hypothyroid    P:   SSI TSH normal  NEUROLOGIC A:   NIMV off  P:   Start home cybalta     FAMILY  - Updates: pt updated 12/20, no family   - Inter-disciplinary family meet or Palliative Care meeting due by:  12/26  To triad, out of icu  Lavon Paganini. Titus Mould, MD, [place] Pgr:  [place]

## 2015-08-05 NOTE — Progress Notes (Signed)
NURSING PROGRESS NOTE  [facility] [place] [id number] Transfer Data: 08/05/2015 6:20 PM Attending Provider: Javier Glazier, MD [id number] Carmelia Roller, MD Code Status: FUll TINSLEIGH LARANCE is a 64 y.o. female patient transferred from 69M  -No acute distress noted.  -No complaints of shortness of breath.  -No complaints of chest pain.   Blood pressure 132/60, pulse 70, temperature 98.4 F (36.9 C), temperature source Oral, resp. rate 27, height 5\' 4"  (1.626 m), weight 116.3 kg (256 lb 6.3 oz), SpO2 94 %.   IV Fluids:  IV in place, may refer to [facility].  Allergies:  Review of patient's allergies indicates no known allergies.  Past Medical History:   has a past medical history of Diabetes mellitus; Hypertension; Hypothyroidism; Cataract; Chronic kidney disease; Back pain; Elbow fracture, right; Obesity; Depression; Hyperplastic colon polyp (2001); Pap smear for cervical cancer screening; Anemia; Sleep apnea; Pneumonia; GERD (gastroesophageal reflux disease); Arthritis; Tongue cancer ([place]) (06/16/2015); Nephrolithiasis (11/2007, 01/2012); and Cancer ([place]) (06/08/15 bx).  Past Surgical History:   has past surgical history that includes Cataract extraction (Left); Lithotripsy (11/2007); Dilation and curettage of uterus; AV fistula placement (Left, 05/12/2015); and AV fistula placement (05/12/15).  Social History:   reports that she has never smoked. She has never used smokeless tobacco. She reports that she does not drink alcohol or use illicit drugs.  Skin: Intact  Patient/Family orientated to room. Information packet given to patient/family. Admission inpatient armband information verified with patient/family to include name and date of birth and placed on patient arm. Side rails up x 2, fall assessment and education completed with patient/family. Patient/family able to verbalize understanding of risk associated with falls and verbalized understanding to call for assistance before getting out of bed.  Call light within reach. Patient/family able to voice and demonstrate understanding of unit orientation instructions.    Will continue to evaluate and treat per MD orders.

## 2015-08-05 NOTE — Procedures (Signed)
[place] chest  1. Left small no no effusion 2. Rt , small to moderate pleaural effusion, noct loculated, mild atx 3. Poor images on left  No role thora Lavon Paganini. Titus Mould, MD, [place] Pgr: [place] [facility]

## 2015-08-06 ENCOUNTER — Inpatient Hospital Stay (HOSPITAL_COMMUNITY): Payer: Medicare Other

## 2015-08-06 ENCOUNTER — Encounter (HOSPITAL_COMMUNITY): Payer: Self-pay | Admitting: General Practice

## 2015-08-06 DIAGNOSIS — I5031 Acute diastolic (congestive) heart failure: Secondary | ICD-10-CM

## 2015-08-06 DIAGNOSIS — I509 Heart failure, unspecified: Secondary | ICD-10-CM

## 2015-08-06 LAB — BASIC METABOLIC PANEL
Anion gap: 13 (ref 5–15)
BUN: 106 mg/dL — AB (ref 6–20)
CHLORIDE: 100 mmol/L — AB (ref 101–111)
CO2: 26 mmol/L (ref 22–32)
CREATININE: 5.89 mg/dL — AB (ref 0.44–1.00)
Calcium: 8.3 mg/dL — ABNORMAL LOW (ref 8.9–10.3)
GFR calc Af Amer: 8 mL/min — ABNORMAL LOW (ref >60)
GFR calc non Af Amer: 7 mL/min — ABNORMAL LOW (ref >60)
GLUCOSE: 116 mg/dL — AB (ref 65–99)
Potassium: 3.3 mmol/L — ABNORMAL LOW (ref 3.5–5.1)
Sodium: 139 mmol/L (ref 135–145)

## 2015-08-06 LAB — RENAL FUNCTION PANEL
Albumin: 3 g/dL — ABNORMAL LOW (ref 3.5–5.0)
Anion gap: 15 (ref 5–15)
BUN: 106 mg/dL — AB (ref 6–20)
CHLORIDE: 100 mmol/L — AB (ref 101–111)
CO2: 24 mmol/L (ref 22–32)
CREATININE: 5.86 mg/dL — AB (ref 0.44–1.00)
Calcium: 8.3 mg/dL — ABNORMAL LOW (ref 8.9–10.3)
GFR calc Af Amer: 8 mL/min — ABNORMAL LOW (ref >60)
GFR calc non Af Amer: 7 mL/min — ABNORMAL LOW (ref >60)
Glucose, Bld: 117 mg/dL — ABNORMAL HIGH (ref 65–99)
Phosphorus: 6.8 mg/dL — ABNORMAL HIGH (ref 2.5–4.6)
Potassium: 3.3 mmol/L — ABNORMAL LOW (ref 3.5–5.1)
Sodium: 139 mmol/L (ref 135–145)

## 2015-08-06 LAB — PROCALCITONIN: PROCALCITONIN: 0.63 ng/mL

## 2015-08-06 LAB — PARATHYROID HORMONE, INTACT (NO CA): PTH: 326 pg/mL — AB (ref 15–65)

## 2015-08-06 LAB — GLUCOSE, CAPILLARY
GLUCOSE-CAPILLARY: 140 mg/dL — AB (ref 65–99)
GLUCOSE-CAPILLARY: 149 mg/dL — AB (ref 65–99)
GLUCOSE-CAPILLARY: 150 mg/dL — AB (ref 65–99)
Glucose-Capillary: 105 mg/dL — ABNORMAL HIGH (ref 65–99)
Glucose-Capillary: 122 mg/dL — ABNORMAL HIGH (ref 65–99)
Glucose-Capillary: 143 mg/dL — ABNORMAL HIGH (ref 65–99)

## 2015-08-06 MED ORDER — CALCIUM ACETATE (PHOS BINDER) 667 MG PO CAPS
667.0000 mg | ORAL_CAPSULE | Freq: Three times a day (TID) | ORAL | Status: DC
  Administered 2015-08-06 – 2015-08-08 (×7): 667 mg via ORAL
  Filled 2015-08-06 (×7): qty 1

## 2015-08-06 MED ORDER — INSULIN ASPART 100 UNIT/ML ~~LOC~~ SOLN
0.0000 [IU] | Freq: Three times a day (TID) | SUBCUTANEOUS | Status: DC
  Administered 2015-08-06 – 2015-08-07 (×3): 1 [IU] via SUBCUTANEOUS
  Administered 2015-08-07: 2 [IU] via SUBCUTANEOUS
  Administered 2015-08-08: 1 [IU] via SUBCUTANEOUS

## 2015-08-06 MED ORDER — INSULIN GLARGINE 100 UNIT/ML ~~LOC~~ SOLN
15.0000 [IU] | Freq: Every day | SUBCUTANEOUS | Status: DC
  Administered 2015-08-06 – 2015-08-07 (×2): 15 [IU] via SUBCUTANEOUS
  Filled 2015-08-06 (×3): qty 0.15

## 2015-08-06 MED ORDER — INSULIN ASPART 100 UNIT/ML ~~LOC~~ SOLN: 2.0000 [IU] | Freq: Three times a day (TID) | SUBCUTANEOUS | Status: DC

## 2015-08-06 MED ORDER — LEVOTHYROXINE SODIUM 75 MCG PO TABS
150.0000 ug | ORAL_TABLET | Freq: Every day | ORAL | Status: DC
  Administered 2015-08-07 – 2015-08-08 (×2): 150 ug via ORAL
  Filled 2015-08-06: qty 2
  Filled 2015-08-06 (×3): qty 1

## 2015-08-06 MED ORDER — METOPROLOL TARTRATE 50 MG PO TABS
50.0000 mg | ORAL_TABLET | Freq: Two times a day (BID) | ORAL | Status: DC
  Administered 2015-08-06 – 2015-08-08 (×4): 50 mg via ORAL
  Filled 2015-08-06 (×4): qty 1

## 2015-08-06 MED ORDER — ALBUTEROL SULFATE (2.5 MG/3ML) 0.083% IN NEBU: 2.5000 mg | INHALATION_SOLUTION | RESPIRATORY_TRACT | Status: DC | PRN

## 2015-08-06 MED ORDER — PANTOPRAZOLE SODIUM 40 MG PO TBEC
40.0000 mg | DELAYED_RELEASE_TABLET | Freq: Every day | ORAL | Status: DC
  Administered 2015-08-06 – 2015-08-07 (×2): 40 mg via ORAL
  Filled 2015-08-06 (×2): qty 1

## 2015-08-06 MED ORDER — POTASSIUM CHLORIDE CRYS ER 20 MEQ PO TBCR
20.0000 meq | EXTENDED_RELEASE_TABLET | Freq: Two times a day (BID) | ORAL | Status: AC
  Administered 2015-08-06 (×2): 20 meq via ORAL
  Filled 2015-08-06 (×2): qty 1

## 2015-08-06 MED ORDER — AMLODIPINE BESYLATE 10 MG PO TABS
10.0000 mg | ORAL_TABLET | Freq: Every day | ORAL | Status: DC
  Administered 2015-08-07 – 2015-08-08 (×2): 10 mg via ORAL
  Filled 2015-08-06 (×2): qty 1

## 2015-08-06 MED ORDER — INSULIN ASPART 100 UNIT/ML ~~LOC~~ SOLN: 0.0000 [IU] | Freq: Every day | SUBCUTANEOUS | Status: DC

## 2015-08-06 NOTE — Progress Notes (Signed)
PT Cancellation Note  Patient Details Name: Tami Elliott MRN: [id number] DOB: 21-Mar-1951   Cancelled Treatment:    Reason Eval/Treat Not Completed: Patient not medically ready Pt with low potassium 3.3. Will attempt PT eval as appropriate   Melford Tullier 08/06/2015, 10:50 AM

## 2015-08-06 NOTE — Progress Notes (Signed)
  Echocardiogram 2D Echocardiogram has been performed.  Tami Elliott 08/06/2015, 10:52 AM

## 2015-08-06 NOTE — Evaluation (Signed)
Physical Therapy Evaluation Patient Details Name: Tami Elliott MRN: [id number] DOB: May 30, 1951 Today's Date: 08/06/2015   History of Present Illness  65 year old female with a past medical history which includes diabetes, chronic kidney disease, tongue cancer, hypothyroidism, and OSA. She is on home O2. She was recently admitted to [facility] for tongue surgery in the setting of cancer which was supposed be followed up with radiation therapy. The surgery was canceled, however, due to dyspnea. Admitted 12/19 for resp failure, BIPAP. lasix for CHF    Clinical Impression  Pt admitted with above diagnosis. Patient's mobility primarily limited by poor cardiopulmonary dynamics. Patient on 4L O2 (as she uses at home) and desaturated to steady 82% (shortened O2 tubing, attempted pursed lip breathing, no talking). After 5 minutes of slow incr pt up to 90-91%. Ambulated around bed to chair slowly and decr to 87%. Pt currently with functional limitations due to the deficits listed below (see PT Problem List).  Pt will benefit from skilled PT to improve her respiratory techniques to allow her to mobilize at home.     Follow Up Recommendations No PT follow up    Equipment Recommendations  None recommended by PT    Recommendations for Other Services OT consult     Precautions / Restrictions Precautions Precautions: Other (comment) (desaturates with activity)      Mobility  Bed Mobility Overal bed mobility: Modified Independent             General bed mobility comments: HOB elevated, no rail; incr time  Transfers Overall transfer level: Independent Equipment used: None             General transfer comment: x 2  Ambulation/Gait Ambulation/Gait assistance: Supervision Ambulation Distance (Feet): 15 Feet Assistive device: None Gait Pattern/deviations: Step-through pattern;Decreased stride length Gait velocity: slow Gait velocity interpretation: Below normal speed  for age/gender General Gait Details: limited in crowded room and SaO2 decr  Stairs            Wheelchair Mobility    Modified Rankin (Stroke Patients Only)       Balance Overall balance assessment: No apparent balance deficits (not formally assessed)                                           Pertinent Vitals/Pain Pain Assessment: No/denies pain    Home Living Family/patient expects to be discharged to:: Private residence Living Arrangements: Spouse/significant other;Children;Other relatives (53 yo grandson) Available Help at Discharge: Family Type of Home: House Home Access: Stairs to enter Entrance Stairs-Rails: None Technical brewer of Steps: 1 Home Layout: One level Home Equipment: None      Prior Function Level of Independence: Independent               Hand Dominance        Extremity/Trunk Assessment   Upper Extremity Assessment: Overall WFL for tasks assessed           Lower Extremity Assessment: Overall WFL for tasks assessed      Cervical / Trunk Assessment: Normal  Communication   Communication: No difficulties  Cognition Arousal/Alertness: Awake/alert Behavior During Therapy: WFL for tasks assessed/performed Overall Cognitive Status: Within Functional Limits for tasks assessed                      General Comments      Exercises Other  Exercises Other Exercises: Educated in pursed lip breathing with pt good return demonstration. Pt is talkative and educated to limit talking when performing activity      Assessment/Plan    PT Assessment Patient needs continued PT services  PT Diagnosis Difficulty walking   PT Problem List Decreased activity tolerance;Decreased mobility;Decreased knowledge of precautions;Cardiopulmonary status limiting activity;Obesity  PT Treatment Interventions Gait training;Functional mobility training;Therapeutic activities;Therapeutic exercise;Patient/family education    PT Goals (Current goals can be found in the Care Plan section) Acute Rehab PT Goals Patient Stated Goal: be able to feel stronger PT Goal Formulation: With patient Time For Goal Achievement: 08/13/15 Potential to Achieve Goals: Good    Frequency Min 3X/week   Barriers to discharge        Co-evaluation               End of Session Equipment Utilized During Treatment: Oxygen Activity Tolerance: Treatment limited secondary to medical complications (Comment) (desaturation) Patient left: in chair;with call bell/phone within reach;with chair alarm set;with nursing/sitter in room Nurse Communication: Mobility status (desaturation; RN came to assess)         Time: [id number] PT Time Calculation (min) (ACUTE ONLY): 35 min   Charges:   PT Evaluation $Initial PT Evaluation Tier I: 1 Procedure PT Treatments $Self Care/Home Management: 8-22   PT G Codes:        Tami Elliott August 21, 2015, 4:27 PM Pager [phone]

## 2015-08-06 NOTE — Progress Notes (Signed)
PATIENT DETAILS Name: Tami Elliott Age: 64 y.o. Sex: female Date of Birth: [birth year] Admit Date: 08/03/2015 Admitting Physician Javier Glazier, MD [id number] Carmelia Roller, MD   Brief narrative:  64 year old female with past medical history of chronic kidney disease, squamous cell carcinoma of the tongue, and hypertension admitted to [facility] 12/19 for pulmonary edema secondary to volume overload in the setting of chronic kidney disease. Admitted to the intensive care unit, managed with BiPAP and IV Lasix. Upon stability transferred to the [facility] on 12/22. Nephrology continues to follow.   Subjective: Doing well, no major complaints.  Assessment/Plan: Active Problems: Acute hypoxemic respiratory failure: Secondary to acute pulmonary edema/Acute diastolic heart failure in the setting of stage IV chronic kidney,. Await echocardiogram,continue diuresis, taper off oxygen as tolerated.  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   Suspected acute diastolic heart failure: in the setting of chronic kidney disease stage IV. Continue diuretics, await echocardiogram.negative balance of 3.9 L, weight down to 250 pounds   Hypokalemia: Secondary to diuretics, repeat and recheck.  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 Follow an adjust accordingly  Hypothyroidism: Continue Synthroid   Type 2 diabetes: CBGs stable with SSI-restart Lantus-start at 15 units qhs (65 units qhs-home dose) and follow  History of squamous cell carcinoma of the tongue: Deferred to the outpatient setting   Disposition: Remain inpatient  Antimicrobial agents  See below  Anti-infectives    None      DVT  Prophylaxis: Prophylactic Heparin   Code Status: Full code  Family Communication None at bedside  Procedures: None  CONSULTS:  pulmonary/intensive care and nephrology  Time spent 30 minutes-Greater than 50% of this time was spent in counseling, explanation of diagnosis, planning of further management, and coordination of care.  MEDICATIONS: Scheduled Meds: . [START ON 08/07/2015] amLODipine  10 mg Oral Daily  . antiseptic oral rinse  7 mL Mouth Rinse q12n4p  . aspirin  81 mg Oral Daily  . calcium acetate  667 mg Oral TID WC  . chlorhexidine  15 mL Mouth Rinse BID  . darbepoetin (ARANESP) injection - NON-DIALYSIS  100 mcg Subcutaneous Q Wed-1800  . DULoxetine  30 mg Oral Daily  . DULoxetine  60 mg Oral QHS  . furosemide  160 mg Intravenous Q6H  . heparin  5,000 Units Subcutaneous 3 times per day  . insulin aspart  0-5 Units Subcutaneous QHS  . insulin aspart  0-9 Units Subcutaneous TID WC  . levothyroxine  75 mcg Intravenous Daily  . metoprolol tartrate  50 mg Oral BID  . pantoprazole  40 mg Oral QHS  . potassium chloride  20 mEq Oral BID   Continuous Infusions: . sodium chloride 10 mL/hr at 08/04/15 1700   PRN Meds:.sodium chloride, metoprolol    PHYSICAL EXAM: Vital signs in last 24 hours: Filed Vitals:   08/05/15 2125 08/05/15 2132 08/06/15 0524 08/06/15 1254  BP: 130/65 130/65 131/65 129/60  Pulse: 66 71 54 62  Temp:  97.5 F (36.4 C) 98.4 F (36.9 C) 98.2 F (36.8 C)  TempSrc:  Oral Oral Oral  Resp:  18 18 20   Height:      Weight:   113.671 kg (250  lb 9.6 oz)   SpO2:  94% 97% 93%    Weight change: -2.629 kg (-5 lb 12.7 oz) Filed Weights   08/04/15 0235 08/05/15 0423 08/06/15 0524  Weight: 106.641 kg (235 lb 1.6 oz) 116.3 kg (256 lb 6.3 oz) 113.671 kg (250 lb 9.6 oz)   Body mass index is 42.99 kg/(m^2).   Gen Exam: Awake and alert with clear speech.   Neck: Supple, No JVD.  Chest: Few bibasilar rales CVS: S1 S2 Regular, no murmurs.  Abdomen:  soft, BS +, non tender, non distended.  Extremities: no edema, lower extremities warm to touch. Neurologic: Non Focal.   Skin: No Rash.   Wounds: N/A.    Intake/Output from previous day:  Intake/Output Summary (Last 24 hours) at 08/06/15 1310 Last data filed at 08/06/15 1130  Gross per 24 hour  Intake   1038 ml  Output   1875 ml  Net   -837 ml     LAB RESULTS: CBC  Recent Labs Lab 08/03/15 2210 08/04/15 0307 08/05/15 0338  WBC  --  11.8* 11.3*  HGB 9.9* 8.7* 8.5*  HCT 29.0* 27.4* 25.2*  PLT  --  313 336  MCV  --  86.4 85.7  MCH  --  27.4 28.9  MCHC  --  31.8 33.7  RDW  --  15.2 15.5  LYMPHSABS  --   --  0.7  MONOABS  --   --  0.9  EOSABS  --   --  0.0  BASOSABS  --   --  0.0    Chemistries   Recent Labs Lab 08/04/15 0307 08/04/15 0908 08/04/15 1252 08/05/15 0338 08/06/15 0534  NA 136 135 140 139 139  139  K 5.8* 5.0 4.8 3.6 3.3*  3.3*  CL 102 99* 101 101 100*  100*  CO2 21* 21* 23 24 24  26   GLUCOSE 222* 279* 218* 143* 117*  116*  BUN 82* 82* 85* 96* 106*  106*  CREATININE 5.51* 5.43* 5.58* 5.66* 5.86*  5.89*  CALCIUM 9.0 8.8* 8.7* 8.7* 8.3*  8.3*  MG 2.4  --   --   --   --     CBG:  Recent Labs Lab 08/05/15 2211 08/06/15 0009 08/06/15 0417 08/06/15 0811 08/06/15 1128  GLUCAP 159* 140* 150* 105* 122*    GFR Estimated Creatinine Clearance: 12.1 mL/min (by C-G formula based on Cr of 5.86).  Coagulation profile  Recent Labs Lab 08/03/15 2201  INR 1.15    Cardiac Enzymes  Recent Labs Lab 08/03/15 2201 08/04/15 0307 08/04/15 0624  TROPONINI 0.06* 0.07* 0.06*    Invalid input(s): POCBNP No results for input(s): DDIMER in the last 72 hours. No results for input(s): HGBA1C in the last 72 hours. No results for input(s): CHOL, HDL, LDLCALC, TRIG, CHOLHDL, LDLDIRECT in the last 72 hours.  Recent Labs  08/04/15 0307  TSH 3.470    Recent Labs  08/04/15 1252  FERRITIN 325*  TIBC 253  IRON 52   No results for  input(s): LIPASE, AMYLASE in the last 72 hours.  Urine Studies No results for input(s): UHGB, CRYS in the last 72 hours.  Invalid input(s): UACOL, UAPR, USPG, UPH, UTP, UGL, UKET, UBIL, UNIT, UROB, ULEU, UEPI, UWBC, URBC, UBAC, CAST, UCOM, BILUA  MICROBIOLOGY: Recent Results (from the past 240 hour(s))  MRSA PCR Screening     Status: None   Collection Time: 08/04/15  2:34 AM  Result Value Ref Range Status   MRSA by  PCR NEGATIVE NEGATIVE Final    Comment:        The GeneXpert MRSA Assay (FDA approved for NASAL specimens only), is one component of a comprehensive MRSA colonization surveillance program. It is not intended to diagnose MRSA infection nor to guide or monitor treatment for MRSA infections.     RADIOLOGY STUDIES/RESULTS: Dg Chest Port 1 View  08/05/2015  CLINICAL DATA:  Pulmonary edema.  Shortness of breath. EXAM: PORTABLE CHEST 1 VIEW COMPARISON:  08/03/2015. FINDINGS: Cardiomegaly with bilateral pulmonary infiltrates consistent with pulmonary edema. Bilateral pleural effusions. No pneumothorax. No acute osseous abnormality . IMPRESSION: Congestive heart failure with prominent bilateral pulmonary edema and moderate bilateral effusions. Similar findings noted on prior exam. Electronically Signed   By: Marcello Moores  Register   On: 08/05/2015 07:25   Dg Chest Portable 1 View  08/03/2015  CLINICAL DATA:  64 year old female with shortness of breath EXAM: PORTABLE CHEST 1 VIEW COMPARISON:  Chest radiograph dated 01/06/2013 FINDINGS: Single portable view of the chest demonstrate cardiomegaly with bilateral interstitial and central vascular prominence most compatible with congestive changes. Superimposed pneumonia is not excluded. Small bilateral pleural effusions may be present. There is no pneumothorax. The osseous structures are grossly unremarkable. IMPRESSION: Cardiomegaly with congestive changes. Clinical correlation and follow-up recommended. Electronically Signed   By: Anner Crete M.D.   On: 08/03/2015 21:58    Oren Binet, MD  [facility] Pager:[phone]  If 7PM-7AM, please contact night-coverage [email or web address] Password [email or web address] 08/06/2015, 1:10 PM   LOS: 2 days

## 2015-08-06 NOTE — Progress Notes (Signed)
S: No uremic Sxs O:BP 131/65 mmHg  Pulse 54  Temp(Src) 98.4 F (36.9 C) (Oral)  Resp 18  Ht 5\' 4"  (1.626 m)  Wt 113.671 kg (250 lb 9.6 oz)  BMI 42.99 kg/m2  SpO2 97%  Intake/Output Summary (Last 24 hours) at 08/06/15 1100 Last data filed at 08/06/15 0549  Gross per 24 hour  Intake 902.72 ml  Output   1275 ml  Net -372.28 ml   Weight change: -2.629 kg (-5 lb 12.7 oz) [id number] and alert.   Mouth:  Mass on Lt side of tongue CVS:RRR Resp: Bilateral crackles, improving Abd:+ BS NTND Ext: 0 edema   Lt AVF small + bruit NEURO: CNI Ox3 no asterixis   . amLODipine  10 mg Oral Daily  . antiseptic oral rinse  7 mL Mouth Rinse q12n4p  . aspirin  81 mg Oral Daily  . chlorhexidine  15 mL Mouth Rinse BID  . darbepoetin (ARANESP) injection - NON-DIALYSIS  100 mcg Subcutaneous Q Wed-1800  . DULoxetine  30 mg Oral Daily  . DULoxetine  60 mg Oral QHS  . furosemide  160 mg Intravenous Q6H  . heparin  5,000 Units Subcutaneous 3 times per day  . insulin aspart  0-5 Units Subcutaneous QHS  . insulin aspart  0-9 Units Subcutaneous TID WC  . levothyroxine  75 mcg Intravenous Daily  . metoprolol tartrate  50 mg Oral BID  . pantoprazole (PROTONIX) IV  40 mg Intravenous Q24H   Dg Chest Port 1 View  08/05/2015  CLINICAL DATA:  Pulmonary edema.  Shortness of breath. EXAM: PORTABLE CHEST 1 VIEW COMPARISON:  08/03/2015. FINDINGS: Cardiomegaly with bilateral pulmonary infiltrates consistent with pulmonary edema. Bilateral pleural effusions. No pneumothorax. No acute osseous abnormality . IMPRESSION: Congestive heart failure with prominent bilateral pulmonary edema and moderate bilateral effusions. Similar findings noted on prior exam. Electronically Signed   By: McIntosh   On: 08/05/2015 07:25   BMET    Component Value Date/Time   NA 139 08/06/2015 0534   NA 139 08/06/2015 0534   K 3.3* 08/06/2015 0534   K 3.3* 08/06/2015 0534   CL 100* 08/06/2015 0534   CL 100* 08/06/2015 0534   CO2  24 08/06/2015 0534   CO2 26 08/06/2015 0534   GLUCOSE 117* 08/06/2015 0534   GLUCOSE 116* 08/06/2015 0534   BUN 106* 08/06/2015 0534   BUN 106* 08/06/2015 0534   CREATININE 5.86* 08/06/2015 0534   CREATININE 5.89* 08/06/2015 0534   CALCIUM 8.3* 08/06/2015 0534   CALCIUM 8.3* 08/06/2015 0534   GFRNONAA 7* 08/06/2015 0534   GFRNONAA 7* 08/06/2015 0534   GFRAA 8* 08/06/2015 0534   GFRAA 8* 08/06/2015 0534   CBC    Component Value Date/Time   WBC 11.3* 08/05/2015 0338   RBC 2.94* 08/05/2015 0338   HGB 8.5* 08/05/2015 0338   HCT 25.2* 08/05/2015 0338   PLT 336 08/05/2015 0338   MCV 85.7 08/05/2015 0338   MCH 28.9 08/05/2015 0338   MCHC 33.7 08/05/2015 0338   RDW 15.5 08/05/2015 0338   LYMPHSABS 0.7 08/05/2015 0338   MONOABS 0.9 08/05/2015 0338   EOSABS 0.0 08/05/2015 0338   BASOSABS 0.0 08/05/2015 0338     Assessment: 1. Acute on CKD 4 Scr was 3.55 in Oct '16.  UO good, Scr appears to be leveling off 2. Pulm edema 3. Hyperkalemia, resolved 4. Anemia on aranesp 5. Ca of tongue 6. HyperPO4 on replacement  Plan: 1.  Change BP meds to pills  2. Replace K 3. Daily Scr.  Hopefully renal fx will improve 4. Cont IV lasix for now 5. Start PO binder   Aizlynn Digilio T

## 2015-08-07 ENCOUNTER — Inpatient Hospital Stay (HOSPITAL_COMMUNITY): Payer: Medicare Other

## 2015-08-07 DIAGNOSIS — I5033 Acute on chronic diastolic (congestive) heart failure: Secondary | ICD-10-CM

## 2015-08-07 LAB — RENAL FUNCTION PANEL
ALBUMIN: 3.1 g/dL — AB (ref 3.5–5.0)
Anion gap: 14 (ref 5–15)
BUN: 109 mg/dL — ABNORMAL HIGH (ref 6–20)
CALCIUM: 8.5 mg/dL — AB (ref 8.9–10.3)
CO2: 27 mmol/L (ref 22–32)
CREATININE: 5.8 mg/dL — AB (ref 0.44–1.00)
Chloride: 98 mmol/L — ABNORMAL LOW (ref 101–111)
GFR calc Af Amer: 8 mL/min — ABNORMAL LOW (ref >60)
GFR calc non Af Amer: 7 mL/min — ABNORMAL LOW (ref >60)
GLUCOSE: 112 mg/dL — AB (ref 65–99)
PHOSPHORUS: 6.6 mg/dL — AB (ref 2.5–4.6)
Potassium: 3.6 mmol/L (ref 3.5–5.1)
SODIUM: 139 mmol/L (ref 135–145)

## 2015-08-07 LAB — GLUCOSE, CAPILLARY
GLUCOSE-CAPILLARY: 152 mg/dL — AB (ref 65–99)
Glucose-Capillary: 175 mg/dL — ABNORMAL HIGH (ref 65–99)
Glucose-Capillary: 96 mg/dL (ref 65–99)
Glucose-Capillary: 140 mg/dL — ABNORMAL HIGH (ref 65–99)
Glucose-Capillary: 134 mg/dL — ABNORMAL HIGH (ref 65–99)

## 2015-08-07 MED ORDER — SODIUM CHLORIDE 0.9 % IV SOLN
125.0000 mg | Freq: Every day | INTRAVENOUS | Status: DC
  Administered 2015-08-07 – 2015-08-08 (×2): 125 mg via INTRAVENOUS
  Filled 2015-08-07 (×3): qty 10

## 2015-08-07 MED ORDER — FUROSEMIDE 10 MG/ML IJ SOLN
160.0000 mg | Freq: Two times a day (BID) | INTRAVENOUS | Status: DC
  Administered 2015-08-08: 160 mg via INTRAVENOUS
  Filled 2015-08-07 (×2): qty 16

## 2015-08-07 MED ORDER — CALCITRIOL 0.25 MCG PO CAPS
0.2500 ug | ORAL_CAPSULE | Freq: Every day | ORAL | Status: DC
  Administered 2015-08-07 – 2015-08-08 (×2): 0.25 ug via ORAL
  Filled 2015-08-07 (×2): qty 1

## 2015-08-07 NOTE — Care Management Important Message (Signed)
Important Message  Patient Details  Name: Tami Elliott MRN: [id number] Date of Birth: [birth year]   Medicare Important Message Given:  Yes    Nathen May 08/07/2015, 12:01 PM

## 2015-08-07 NOTE — Progress Notes (Signed)
Subjective:  Stable overnight- she feels better- moving around- appetite OK- great UOP on lasix- crt high but stable- she tells me a weight of 247 is lower for her by about 6-7 pounds  Objective Vital signs in last 24 hours: Filed Vitals:   08/06/15 1254 08/06/15 2118 08/07/15 0506 08/07/15 0520  BP: 129/60 138/65 145/72   Pulse: 62 67 63   Temp: 98.2 F (36.8 C) 97.7 F (36.5 C) 98.4 F (36.9 C)   TempSrc: Oral Oral Oral   Resp: 20 24 20    Height:      Weight:    112.447 kg (247 lb 14.4 oz)  SpO2: 93% 95% 93%    Weight change: -1.224 kg (-2 lb 11.2 oz)  Intake/Output Summary (Last 24 hours) at 08/07/15 1214 Last data filed at 08/07/15 1032  Gross per 24 hour  Intake   1744 ml  Output   2275 ml  Net   -531 ml    Assessment/ Plan: Pt is a 64 y.o. yo female who was admitted on 08/03/2015 with volume overload and A on CRF   Assessment/Plan: 1. Renal- advanced CKD at baseline followed by Dr. Posey Pronto as OP- crt was 3.55 in October- pt s/p AVF but is small and likely not adequate for use at this time.  Now creatinine near 6 in the setting of diuresis.  She does not seem uremic- no indications for HD 2. HTN/volume- volume status is better- she says below what her weight has been at home- she is on O2 as OP- never did take scheduled lasix as OP- I will back off on the dose with aim to get her to PO dose after tomorrow 3. Anemia- due to hosp and CKD- is on ESA- iron low as well - will replete 4. Secondary hyperparathyroidism- PTH 326- high phos- on phoslo- will add low dose vit D as well   Rise Traeger A    Labs: Basic Metabolic Panel:  Recent Labs Lab 08/05/15 0338 08/06/15 0534 08/07/15 0608  NA 139 139  139 139  K 3.6 3.3*  3.3* 3.6  CL 101 100*  100* 98*  CO2 24 24  26 27   GLUCOSE 143* 117*  116* 112*  BUN 96* 106*  106* 109*  CREATININE 5.66* 5.86*  5.89* 5.80*  CALCIUM 8.7* 8.3*  8.3* 8.5*  PHOS 6.8* 6.8* 6.6*   Liver Function Tests:  Recent  Labs Lab 08/03/15 2201 08/06/15 0534 08/07/15 0608  AST 26  --   --   ALT 21  --   --   ALKPHOS 119  --   --   BILITOT 0.9  --   --   PROT 7.2  --   --   ALBUMIN 3.3* 3.0* 3.1*   No results for input(s): LIPASE, AMYLASE in the last 168 hours. No results for input(s): AMMONIA in the last 168 hours. CBC:  Recent Labs Lab 08/03/15 2210 08/04/15 0307 08/05/15 0338  WBC  --  11.8* 11.3*  NEUTROABS  --   --  9.7*  HGB 9.9* 8.7* 8.5*  HCT 29.0* 27.4* 25.2*  MCV  --  86.4 85.7  PLT  --  313 336   Cardiac Enzymes:  Recent Labs Lab 08/03/15 2201 08/04/15 0307 08/04/15 0624  TROPONINI 0.06* 0.07* 0.06*   CBG:  Recent Labs Lab 08/06/15 0811 08/06/15 1128 08/06/15 1719 08/06/15 2114 08/07/15 0758  GLUCAP 105* 122* 143* 149* 96    Iron Studies:  Recent Labs  08/04/15 1252  IRON 52  TIBC 253  FERRITIN 325*   Studies/Results: Dg Chest Port 1 View  08/07/2015  CLINICAL DATA:  Shortness of breath EXAM: PORTABLE CHEST 1 VIEW COMPARISON:  08/05/2015 FINDINGS: Cardiomegaly with perihilar and lower lobe airspace opacities and layering effusions. Some improvement in aeration and airspace disease since prior study, likely improving edema. IMPRESSION: Mild CHF with some improvement since prior study. Electronically Signed   By: Rolm Baptise M.D.   On: 08/07/2015 08:12   Medications: Infusions: . sodium chloride 10 mL/hr at 08/04/15 1700    Scheduled Medications: . amLODipine  10 mg Oral Daily  . antiseptic oral rinse  7 mL Mouth Rinse q12n4p  . aspirin  81 mg Oral Daily  . calcium acetate  667 mg Oral TID WC  . chlorhexidine  15 mL Mouth Rinse BID  . darbepoetin (ARANESP) injection - NON-DIALYSIS  100 mcg Subcutaneous Q Wed-1800  . DULoxetine  30 mg Oral Daily  . DULoxetine  60 mg Oral QHS  . furosemide  160 mg Intravenous Q6H  . heparin  5,000 Units Subcutaneous 3 times per day  . insulin aspart  0-5 Units Subcutaneous QHS  . insulin aspart  0-9 Units  Subcutaneous TID WC  . insulin glargine  15 Units Subcutaneous QHS  . levothyroxine  150 mcg Oral QAC breakfast  . metoprolol tartrate  50 mg Oral BID  . pantoprazole  40 mg Oral QHS    have reviewed scheduled and prn medications.  Physical Exam: General: NAD Heart: RRR Lungs: dec BS at bases- poor effort Abdomen: obese, soft, non tender Extremities: some dependent edema Dialysis Access: AVF small  On left    08/07/2015,12:14 PM  LOS: 3 days

## 2015-08-07 NOTE — Progress Notes (Signed)
PATIENT DETAILS Name: Tami Elliott Age: 64 y.o. Sex: female Date of Birth: 23-Apr-1951 Admit Date: 08/03/2015 Admitting Physician Javier Glazier, MD [id number] Carmelia Roller, MD   Brief narrative:   64 year old female with past medical history of chronic kidney disease, squamous cell carcinoma of the tongue, and hypertension admitted to [facility] 12/19 for pulmonary edema secondary to volume overload in the setting of chronic kidney disease. Admitted to the intensive care unit, managed with BiPAP and IV Lasix. Upon stability transferred to the triad hospitalist on 12/22. Nephrology continues to follow.    Subjective:  Patient sitting up in chair, shortness of breath has improved, denies any chest pain, no belly pain or focal weakness. Denies headache or fevers.  Assessment/Plan:  Acute hypoxemic respiratory failure due to acute on chronic diastolic CHF EF 123456 on echogram: Secondary to acute pulmonary edema/Acute diastolic heart failure in the setting of stage IV chronic kidney, noted echocardiogram,continue diuresis, taper off oxygen as tolerated. Weight is down to 247 pounds, she is -4.5 L on 08/07/2015. On beta blocker and diuretics continue.  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   Hypokalemia: Placed and stable.  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 Follow an adjust accordingly  Hypothyroidism: Continue Synthroid   History of squamous cell carcinoma of the tongue: Deferred to the outpatient setting   Type 2 diabetes: CBGs stable with SSI-restart Lantus-start at 15 units qhs (65 units qhs-home dose) and follow  Lab Results  Component Value Date   HGBA1C 9.2* 01/18/2012    CBG (last 3)   Recent Labs  08/06/15 1719 08/06/15 2114 08/07/15 0758  GLUCAP 143* 149* 96      Disposition: Remain inpatient  Antimicrobial agents  See below  Anti-infectives    None      DVT Prophylaxis: Prophylactic Heparin   Code Status: Full code  Family Communication None at bedside  Procedures: TTE                *[facility]*            [address], [place] [address]              [phone]  ------------------------------------------------------------------- Transthoracic Echocardiography  Patient:  Tami Elliott, Tami Elliott MR #:    [id number] Study Date: 08/06/2015 Gender:   F Age:    10 Height:   162.6 cm Weight:   113.4 kg BSA:    2.32 m^2 Pt. Status: Room:    5W02C  ATTENDING  Jonetta Osgood SONOGRAPHER Johny Chess, RDCS, CCT PERFORMING  [facility], Inpatient ORDERING   [name] ADMITTING  [name]  cc:  ------------------------------------------------------------------- LV EF: 55% -  60%  ------------------------------------------------------------------- Indications:   CHF - 428.0.  ------------------------------------------------------------------- History:  Risk factors: End stage renal disease. Acute pulmonary edema. Tongue Cancer. Diabetes mellitus.  ------------------------------------------------------------------- Study Conclusions  - Left ventricle: The cavity size was normal. Wall thickness was normal. Systolic function was normal. The estimated ejection fraction was in the range of 55% to 60%. Wall motion was normal; there were  no regional wall motion abnormalities. Doppler parameters are consistent with abnormal left ventricular relaxation (grade 1 diastolic dysfunction). - Pulmonary arteries: Systolic pressure was  moderately increased. PA peak pressure: 53 mm Hg (S). - Pericardium, extracardiac: There was a left pleural effusion.  Impressions:  - Normal LV systolic function; grade 1 diastolic dysfunction; trace TR; moderately elevated pulmonary pressure.    CONSULTS:  pulmonary/intensive care and nephrology  Time spent 30 minutes-Greater than 50% of this time was spent in counseling, explanation of diagnosis, planning of further management, and coordination of care.  MEDICATIONS: Scheduled Meds: . amLODipine  10 mg Oral Daily  . antiseptic oral rinse  7 mL Mouth Rinse q12n4p  . aspirin  81 mg Oral Daily  . calcium acetate  667 mg Oral TID WC  . chlorhexidine  15 mL Mouth Rinse BID  . darbepoetin (ARANESP) injection - NON-DIALYSIS  100 mcg Subcutaneous Q Wed-1800  . DULoxetine  30 mg Oral Daily  . DULoxetine  60 mg Oral QHS  . furosemide  160 mg Intravenous Q6H  . heparin  5,000 Units Subcutaneous 3 times per day  . insulin aspart  0-5 Units Subcutaneous QHS  . insulin aspart  0-9 Units Subcutaneous TID WC  . insulin glargine  15 Units Subcutaneous QHS  . levothyroxine  150 mcg Oral QAC breakfast  . metoprolol tartrate  50 mg Oral BID  . pantoprazole  40 mg Oral QHS   Continuous Infusions: . sodium chloride 10 mL/hr at 08/04/15 1700   PRN Meds:.sodium chloride, albuterol, metoprolol    PHYSICAL EXAM: Vital signs in last 24 hours: Filed Vitals:   08/06/15 1254 08/06/15 2118 08/07/15 0506 08/07/15 0520  BP: 129/60 138/65 145/72   Pulse: 62 67 63   Temp: 98.2 F (36.8 C) 97.7 F (36.5 C) 98.4 F (36.9 C)   TempSrc: Oral Oral Oral   Resp: 20 24 20    Height:      Weight:    112.447 kg (247 lb 14.4 oz)  SpO2: 93% 95% 93%     Weight change: -1.224 kg (-2 lb 11.2 oz) Filed Weights   08/05/15 0423 08/06/15 0524 08/07/15 0520  Weight: 116.3 kg (256 lb 6.3 oz) 113.671 kg (250 lb 9.6 oz) 112.447 kg (247 lb 14.4 oz)   Body mass index is 42.53 kg/(m^2).   Gen Exam: Awake  and alert with clear speech.   Neck: Supple, No JVD.  Chest: Few bibasilar rales CVS: S1 S2 Regular, no murmurs.  Abdomen: soft, BS +, non tender, non distended.  Extremities: no edema, lower extremities warm to touch. Neurologic: Non Focal.   Skin: No Rash.   Wounds: N/A.    Intake/Output from previous day:  Intake/Output Summary (Last 24 hours) at 08/07/15 1136 Last data filed at 08/07/15 1032  Gross per 24 hour  Intake   1744 ml  Output   2275 ml  Net   -531 ml     LAB RESULTS: CBC  Recent Labs Lab 08/03/15 2210 08/04/15 0307 08/05/15 0338  WBC  --  11.8* 11.3*  HGB 9.9* 8.7* 8.5*  HCT 29.0* 27.4* 25.2*  PLT  --  313 336  MCV  --  86.4 85.7  MCH  --  27.4 28.9  MCHC  --  31.8 33.7  RDW  --  15.2 15.5  LYMPHSABS  --   --  0.7  MONOABS  --   --  0.9  EOSABS  --   --  0.0  BASOSABS  --   --  0.0    Chemistries   Recent Labs Lab 08/04/15 0307 08/04/15 0908 08/04/15 1252 08/05/15 0338 08/06/15 0534 08/07/15 0608  NA 136 135 140 139 139  139 139  K 5.8* 5.0 4.8 3.6 3.3*  3.3* 3.6  CL 102 99* 101 101 100*  100* 98*  CO2 21* 21* 23 24 24  26 27   GLUCOSE 222* 279* 218* 143* 117*  116* 112*  BUN 82* 82* 85* 96* 106*  106* 109*  CREATININE 5.51* 5.43* 5.58* 5.66* 5.86*  5.89* 5.80*  CALCIUM 9.0 8.8* 8.7* 8.7* 8.3*  8.3* 8.5*  MG 2.4  --   --   --   --   --     CBG:  Recent Labs Lab 08/06/15 0811 08/06/15 1128 08/06/15 1719 08/06/15 2114 08/07/15 0758  GLUCAP 105* 122* 143* 149* 96    GFR Estimated Creatinine Clearance: 12.2 mL/min (by C-G formula based on Cr of 5.8).  Coagulation profile  Recent Labs Lab 08/03/15 2201  INR 1.15    Cardiac Enzymes  Recent Labs Lab 08/03/15 2201 08/04/15 0307 08/04/15 0624  TROPONINI 0.06* 0.07* 0.06*    Invalid input(s): POCBNP No results for input(s): DDIMER in the last 72 hours. No results for input(s): HGBA1C in the last 72 hours. No results for input(s): CHOL, HDL, LDLCALC, TRIG,  CHOLHDL, LDLDIRECT in the last 72 hours. No results for input(s): TSH, T4TOTAL, T3FREE, THYROIDAB in the last 72 hours.  Invalid input(s): FREET3  Recent Labs  08/04/15 1252  FERRITIN 325*  TIBC 253  IRON 52   No results for input(s): LIPASE, AMYLASE in the last 72 hours.  Urine Studies No results for input(s): UHGB, CRYS in the last 72 hours.  Invalid input(s): UACOL, UAPR, USPG, UPH, UTP, UGL, UKET, UBIL, UNIT, UROB, ULEU, UEPI, UWBC, URBC, UBAC, CAST, UCOM, BILUA  MICROBIOLOGY:  Recent Results (from the past 240 hour(s))  MRSA PCR Screening     Status: None   Collection Time: 08/04/15  2:34 AM  Result Value Ref Range Status   MRSA by PCR NEGATIVE NEGATIVE Final    Comment:        The GeneXpert MRSA Assay (FDA approved for NASAL specimens only), is one component of a comprehensive MRSA colonization surveillance program. It is not intended to diagnose MRSA infection nor to guide or monitor treatment for MRSA infections.     RADIOLOGY STUDIES/RESULTS:  Dg Chest Port 1 View  08/07/2015  CLINICAL DATA:  Shortness of breath EXAM: PORTABLE CHEST 1 VIEW COMPARISON:  08/05/2015 FINDINGS: Cardiomegaly with perihilar and lower lobe airspace opacities and layering effusions. Some improvement in aeration and airspace disease since prior study, likely improving edema. IMPRESSION: Mild CHF with some improvement since prior study. Electronically Signed   By: Rolm Baptise M.D.   On: 08/07/2015 08:12   Dg Chest Port 1 View  08/05/2015  CLINICAL DATA:  Pulmonary edema.  Shortness of breath. EXAM: PORTABLE CHEST 1 VIEW COMPARISON:  08/03/2015. FINDINGS: Cardiomegaly with bilateral pulmonary infiltrates consistent with pulmonary edema. Bilateral pleural effusions. No pneumothorax. No acute osseous abnormality . IMPRESSION: Congestive heart failure with prominent bilateral pulmonary edema and moderate bilateral effusions. Similar findings noted on prior exam. Electronically Signed   By:  Marcello Moores  Register   On: 08/05/2015 07:25   Dg Chest Portable 1 View  08/03/2015  CLINICAL DATA:  64 year old female with shortness of breath EXAM: PORTABLE CHEST 1 VIEW COMPARISON:  Chest radiograph dated 01/06/2013 FINDINGS: Single portable view of the chest  demonstrate cardiomegaly with bilateral interstitial and central vascular prominence most compatible with congestive changes. Superimposed pneumonia is not excluded. Small bilateral pleural effusions may be present. There is no pneumothorax. The osseous structures are grossly unremarkable. IMPRESSION: Cardiomegaly with congestive changes. Clinical correlation and follow-up recommended. Electronically Signed   By: Anner Crete M.D.   On: 08/03/2015 21:58    Thurnell Lose, MD  [facility] Pager:[phone]  If 7PM-7AM, please contact night-coverage [email or web address] Password [facility] 08/07/2015, 11:36 AM   LOS: 3 days

## 2015-08-07 NOTE — Care Management Note (Signed)
Case Management Note  Patient Details  Name: Tami Elliott MRN: [id number] Date of Birth: 1951/04/08  Subjective/Objective:  Patient is from home, per pt eval no pt follow up needed.  Patient is on iv lasix, plan for dc tomorrow, NCM will cont to follow for dc needs.                Action/Plan:   Expected Discharge Date:                  Expected Discharge Plan:  Home/Self Care  In-House Referral:     Discharge planning Services  CM Consult  Post Acute Care Choice:    Choice offered to:     DME Arranged:    DME Agency:     HH Arranged:    HH Agency:     Status of Service:  In process, will continue to follow  Medicare Important Message Given:  Yes Date Medicare IM Given:    Medicare IM give by:    Date Additional Medicare IM Given:    Additional Medicare Important Message give by:     If discussed at [place] of Stay Meetings, dates discussed:    Additional Comments:  Zenon Mayo, RN 08/07/2015, 1:59 PM

## 2015-08-08 LAB — HEMOGLOBIN A1C
Hgb A1c MFr Bld: 5.5 % (ref 4.8–5.6)
Mean Plasma Glucose: 111 mg/dL

## 2015-08-08 LAB — RENAL FUNCTION PANEL
ALBUMIN: 3.3 g/dL — AB (ref 3.5–5.0)
ANION GAP: 15 (ref 5–15)
BUN: 113 mg/dL — ABNORMAL HIGH (ref 6–20)
CALCIUM: 8.8 mg/dL — AB (ref 8.9–10.3)
CO2: 28 mmol/L (ref 22–32)
CREATININE: 6.05 mg/dL — AB (ref 0.44–1.00)
Chloride: 98 mmol/L — ABNORMAL LOW (ref 101–111)
GFR, EST AFRICAN AMERICAN: 8 mL/min — AB (ref >60)
GFR, EST NON AFRICAN AMERICAN: 7 mL/min — AB (ref >60)
Glucose, Bld: 98 mg/dL (ref 65–99)
PHOSPHORUS: 7.5 mg/dL — AB (ref 2.5–4.6)
Potassium: 3.6 mmol/L (ref 3.5–5.1)
SODIUM: 141 mmol/L (ref 135–145)

## 2015-08-08 LAB — GLUCOSE, CAPILLARY
GLUCOSE-CAPILLARY: 93 mg/dL (ref 65–99)
GLUCOSE-CAPILLARY: 132 mg/dL — AB (ref 65–99)

## 2015-08-08 MED ORDER — FUROSEMIDE 10 MG/ML IJ SOLN
100.0000 mg | Freq: Two times a day (BID) | INTRAVENOUS | Status: DC
  Filled 2015-08-08: qty 10

## 2015-08-08 MED ORDER — INSULIN GLARGINE 100 UNIT/ML ~~LOC~~ SOLN: 25.0000 [IU] | Freq: Every day | SUBCUTANEOUS | Status: AC

## 2015-08-08 MED ORDER — POTASSIUM CHLORIDE ER 20 MEQ PO TBCR: 20.0000 meq | EXTENDED_RELEASE_TABLET | Freq: Two times a day (BID) | ORAL | Status: DC

## 2015-08-08 MED ORDER — FUROSEMIDE 80 MG PO TABS: 80.0000 mg | ORAL_TABLET | Freq: Two times a day (BID) | ORAL | Status: DC

## 2015-08-08 NOTE — Discharge Instructions (Signed)
Follow with Primary MD Harvie Junior, MD in 3 days   Get CBC, CMP, 2 view Chest X ray checked  by Primary MD next visit.    Activity: As tolerated with Full fall precautions use walker/cane & assistance as needed   Disposition Home     Diet:   Renal - Low Carb Check your Weight same time everyday, if you gain over 2 pounds, or you develop in leg swelling, experience more shortness of breath or chest pain, call your Primary MD immediately. Follow Cardiac Low Salt Diet and 1.5 lit/day fluid restriction.  Accuchecks 4 times/day, Once in AM empty stomach and then before each meal. Log in all results and show them to your Prim.MD in 3 days. If any glucose reading is under 80 or above 300 call your Prim MD immidiately. Follow Low glucose instructions for glucose under 80 as instructed.   On your next visit with your primary care physician please Get Medicines reviewed and adjusted.   Please request your Prim.MD to go over all Hospital Tests and Procedure/Radiological results at the follow up, please get all Hospital records sent to your Prim MD by signing hospital release before you go home.   If you experience worsening of your admission symptoms, develop shortness of breath, life threatening emergency, suicidal or homicidal thoughts you must seek medical attention immediately by calling 911 or calling your MD immediately  if symptoms less severe.  You Must read complete instructions/literature along with all the possible adverse reactions/side effects for all the Medicines you take and that have been prescribed to you. Take any new Medicines after you have completely understood and accpet all the possible adverse reactions/side effects.   Do not drive, operating heavy machinery, perform activities at heights, swimming or participation in water activities or provide baby sitting services if your were admitted for syncope or siezures until you have seen by Primary MD or a Neurologist and  advised to do so again.  Do not drive when taking Pain medications.    Do not take more than prescribed Pain, Sleep and Anxiety Medications  Special Instructions: If you have smoked or chewed Tobacco  in the last 2 yrs please stop smoking, stop any regular Alcohol  and or any Recreational drug use.  Wear Seat belts while driving.   Please note  You were cared for by a hospitalist during your hospital stay. If you have any questions about your discharge medications or the care you received while you were in the hospital after you are discharged, you can call the unit and asked to speak with the hospitalist on call if the hospitalist that took care of you is not available. Once you are discharged, your primary care physician will handle any further medical issues. Please note that NO REFILLS for any discharge medications will be authorized once you are discharged, as it is imperative that you return to your primary care physician (or establish a relationship with a primary care physician if you do not have one) for your aftercare needs so that they can reassess your need for medications and monitor your lab values.

## 2015-08-08 NOTE — Care Management Note (Signed)
  [place] with [facility]. Jonnie Finner RN CCM Case Mgmt phone [phone]

## 2015-08-08 NOTE — Care Management Note (Signed)
Case Management Note  Patient Details  Name: Tami Elliott MRN: [id number] Date of Birth: 20-Mar-1951  Subjective/Objective:    ESRD, Acute pulmonary edema                 Action/Plan: NCM spoke to pt and offered choice for [facility]. Pt agreeable to [facility] for HH. States she does not need a RW.   Expected Discharge Date:  08/08/2015               Expected Discharge Plan:  [place]  In-House Referral:     Discharge planning Services  CM Consult  Post Acute Care Choice:  Home Health Choice offered to:  Patient  DME Arranged:  N/A DME Agency:  NA  HH Arranged:  RN, PT, OT, Nurse's Aide [place] Agency:  [place]  Status of Service:  Completed, signed off  Medicare Important Message Given:  Yes Date Medicare IM Given:    Medicare IM give by:    Date Additional Medicare IM Given:    Additional Medicare Important Message give by:     If discussed at [place] of Stay Meetings, dates discussed:    Additional Comments:  Erenest Rasher, RN 08/08/2015, 10:31 AM

## 2015-08-08 NOTE — Discharge Summary (Signed)
Tami Elliott, is a 63 y.o. female  DOB [birth year]  MRN [id number].  Admission date:  08/03/2015  Admitting Physician  Javier Glazier, MD  Discharge Date:  08/08/2015   Primary MD  Harvie Junior, MD  Recommendations for primary care physician for things to follow:   Needs close outpatient renal follow-up, monitor BMP in diuretic dose closely   Admission Diagnosis  Hyperkalemia [E87.5] Acute pulmonary edema (HCC) 123XX123 Metabolic acidosis 99991111 ESRD (end stage renal disease) ([place]) [N18.6]   Discharge Diagnosis  Hyperkalemia [E87.5] Acute pulmonary edema ([place]) 123XX123 Metabolic acidosis 99991111 ESRD (end stage renal disease) ([place]) [N18.6]    Active Problems:   Acute hypoxemic respiratory failure ([place])   Acute pulmonary edema (HCC)   ESRD (end stage renal disease) ([place])      Past Medical History  Diagnosis Date  . Diabetes mellitus   . Hypertension   . Hypothyroidism   . Cataract     Left eye( Dr. Katy Fitch, patient uninsured so she can't  get  an intervention)  . Chronic kidney disease     nephrolithiasis, s/p removal 12/05/07  . Back pain     myofascial, excacerberated in 6-7/09, 9/09- required narcotics at those times  . Elbow fracture, right   . Obesity   . Depression     better on nortriptyline  . Hyperplastic colon polyp 2001    f/u colonoscopy 11/23/06 was normal( Dr. Carlean Purl), falls into normal risk screening( next colonoscopy due in 2018)  . Pap smear for cervical cancer screening     12/14/2005: normal, evidence of candida. 05/16/2007: normal, benign reperative changes.  . Anemia   . Sleep apnea   . Pneumonia     as a child  . GERD (gastroesophageal reflux disease)   . Arthritis   . Tongue cancer ([place]) 06/16/2015    SCCa of Left Lateral Tongue  . Nephrolithiasis 11/2007,  01/2012  . Cancer ([place]) 06/08/15 bx    SCC of left lateral tongue   . Acute pulmonary edema ([place]) 08/03/2015    Past Surgical History  Procedure Laterality Date  . Cataract extraction Left   . Lithotripsy  11/2007  . Dilation and curettage of uterus    . Av fistula placement Left 05/12/2015    Procedure: ARTERIOVENOUS (AV) FISTULA CREATION;  Surgeon: Serafina Mitchell, MD;  Location: [facility];  Service: Vascular;  Laterality: Left;  . Av fistula placement  05/12/15    left fore arm       HPI  from the history and physical done on the day of admission:     64 year old female with past medical history of chronic kidney disease, squamous cell carcinoma of the tongue, and hypertension admitted to [facility] 12/19 for pulmonary edema secondary to volume overload in the setting of chronic kidney disease. Admitted to the intensive care unit, managed with BiPAP and IV Lasix. Upon stability transferred to the [facility] hospital[facility] on 12/22. Nephrology continued to follow. I discussed her case with nephrologist on board  Dr. Moshe Cipro today, stable for discharge on 80 mg of Lasix twice a day with close outpatient follow-up with PCP and nephrology in the outpatient setting. Patient is currently symptom free and feeling close to her baseline. Of note she uses 4 L nasal cannula oxygen at home at all times.     Hospital Course:    Acute hypoxemic respiratory failure due to acute on chronic diastolic CHF EF [id number] on echogram: Secondary to acute pulmonary edema/Acute diastolic heart failure in the setting of stage IV chronic kidney, noted echocardiogram,continue diuresis, likely on 4 L nasal cannula oxygen at all times which will be continued. she has now diuresed close to 7 L, she is now symptom-free. She initially required IV Lasix 160 mg subcutaneous 2-3 times a day, discussed her case with Dr. Moshe Cipro nephrologist today patient is stable to be transitioned to 80 mg of twice a day Lasix orally along  with homeless but a blocker and discharged home. She has to follow with PCP and nephrology closely in the outpatient setting. Currently monitor weight, BMP in diuretic dose closely.  Acute on chronic kidney disease stage 4: Nephrology following baseline creatinine around 3.55 on October 2016, transitioned from IV Lasix to 80 mg twice a day Lasix by nephrology, cleared by renal for home discharge at this time.   Hypokalemia: Replaced and stable.  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 Follow an adjust accordingly.  Hypothyroidism: Continue Synthroid   History of squamous cell carcinoma of the tongue: Deferred to the outpatient setting   Type 2 diabetes: CBGs stable with Lantus 25 units qhs (65 units qhs-home dose), [facility] PCP to monitor glycemic control closely.  Lab Results  Component Value Date   HGBA1C 5.5 08/07/2015     Discharge Condition: Fair  Follow UP  Follow-up Information    Follow up with Harvie Junior, MD. Schedule an appointment as soon as possible for a visit in 3 days.   Specialty:  Family Medicine   Contact information:   [place] [address] [phone]       Follow up with Ulla Potash., MD. Schedule an appointment as soon as possible for a visit in 3 days.   Specialty:  Nephrology   Contact information:   [place] [address] [phone]        Consults obtained - Renal  Diet and Activity recommendation: See Discharge Instructions below  Discharge Instructions           Discharge Instructions    Discharge instructions    Complete by:  As directed   Follow with Primary MD Harvie Junior, MD in 3 days   Get CBC, CMP, 2 view Chest X ray checked  by Primary MD next visit.    Activity: As tolerated with Full fall precautions use walker/cane & assistance as needed   Disposition Home     Diet:   Renal - Low  Carb Check your Weight same time everyday, if you gain over 2 pounds, or you develop in leg swelling, experience more shortness of breath or chest pain, call your Primary MD immediately. Follow Cardiac Low Salt Diet and 1.5 lit/day fluid restriction.  Accuchecks 4 times/day, Once in AM empty stomach and then before each meal. Log in all results and show them to your Prim.MD in 3 days. If any glucose reading is under 80 or above 300 call your Prim MD immidiately. Follow Low  glucose instructions for glucose under 80 as instructed.   On your next visit with your primary care physician please Get Medicines reviewed and adjusted.   Please request your Prim.MD to go over all Hospital Tests and Procedure/Radiological results at the follow up, please get all Hospital records sent to your Prim MD by signing hospital release before you go home.   If you experience worsening of your admission symptoms, develop shortness of breath, life threatening emergency, suicidal or homicidal thoughts you must seek medical attention immediately by calling 911 or calling your MD immediately  if symptoms less severe.  You Must read complete instructions/literature along with all the possible adverse reactions/side effects for all the Medicines you take and that have been prescribed to you. Take any new Medicines after you have completely understood and accpet all the possible adverse reactions/side effects.   Do not drive, operating heavy machinery, perform activities at heights, swimming or participation in water activities or provide baby sitting services if your were admitted for syncope or siezures until you have seen by Primary MD or a Neurologist and advised to do so again.  Do not drive when taking Pain medications.    Do not take more than prescribed Pain, Sleep and Anxiety Medications  Special Instructions: If you have smoked or chewed Tobacco  in the last 2 yrs please stop smoking, stop any regular Alcohol   and or any Recreational drug use.  Wear Seat belts while driving.   Please note  You were cared for by a hospitalist during your hospital stay. If you have any questions about your discharge medications or the care you received while you were in the hospital after you are discharged, you can call the unit and asked to speak with the hospitalist on call if the hospitalist that took care of you is not available. Once you are discharged, your primary care physician will handle any further medical issues. Please note that NO REFILLS for any discharge medications will be authorized once you are discharged, as it is imperative that you return to your primary care physician (or establish a relationship with a primary care physician if you do not have one) for your aftercare needs so that they can reassess your need for medications and monitor your lab values.     Increase activity slowly    Complete by:  As directed              Discharge Medications       Medication List    TAKE these medications        amLODipine 10 MG tablet  Commonly known as:  NORVASC  TAKE 1 TABLET BY MOUTH DAILY     cetirizine 10 MG tablet  Commonly known as:  ZYRTEC  Take 10 mg by mouth daily as needed for allergies.     DULoxetine 30 MG capsule  Commonly known as:  CYMBALTA  Take 30-60 mg by mouth See admin instructions. Pt takes 30mg  in am and 60mg  in pm     ferrous sulfate 325 (65 FE) MG tablet  Take 325 mg by mouth daily with breakfast.     FLOVENT HFA 44 MCG/ACT inhaler  Generic drug:  fluticasone  Inhale 1 puff into the lungs 2 (two) times daily.     FLUTICASONE PROPIONATE (INHAL) IN  Inhale 1 spray into the lungs daily as needed (allergies).     furosemide 80 MG tablet  Commonly known as:  LASIX  Take 1 tablet (80  mg total) by mouth 2 (two) times daily. Fluid retention     HYDROcodone-acetaminophen 10-325 MG tablet  Commonly known as:  NORCO  Take 1 tablet by mouth every 6 (six) hours as  needed.     insulin glargine 100 UNIT/ML injection  Commonly known as:  LANTUS  Inject 0.25 mLs (25 Units total) into the skin at bedtime.     levothyroxine 150 MCG tablet  Commonly known as:  SYNTHROID, LEVOTHROID  Take 150 mcg by mouth daily.     metoprolol 50 MG tablet  Commonly known as:  LOPRESSOR  Take 50 mg by mouth 2 (two) times daily.     Potassium Chloride ER 20 MEQ Tbcr  Take 20 mEq by mouth 2 (two) times daily.     ranitidine 300 MG tablet  Commonly known as:  ZANTAC  Take 300 mg by mouth daily as needed for heartburn.     TRULICITY 1.5 0000000 Sopn  Generic drug:  Dulaglutide  Inject 1.5 Units into the skin once a week.     vitamin B-12 100 MCG tablet  Commonly known as:  CYANOCOBALAMIN  Take 100 mcg by mouth daily.     vitamin C 500 MG tablet  Commonly known as:  ASCORBIC ACID  Take 500 mg by mouth daily.     Vitamin D3 2000 UNITS Tabs  Take 1 capsule by mouth daily.        Major procedures and Radiology Reports - PLEASE review detailed and final reports for all details, in brief -   TTE  Study Conclusions  - Left ventricle: The cavity size was normal. Wall thickness was normal. Systolic function was normal. The estimated ejection fraction was in the range of 55% to 60%. Wall motion was normal; there were no regional wall motion abnormalities. Doppler parameters are consistent with abnormal left ventricular relaxation (grade 1 diastolic dysfunction). - Pulmonary arteries: Systolic pressure was moderately increased. PA peak pressure: 53 mm Hg (S). - Pericardium, extracardiac: There was a left pleural effusion.  Impressions:  - Normal LV systolic function; grade 1 diastolic dysfunction; trace TR; moderately elevated pulmonary pressure.   Dg Chest Port 1 View  08/07/2015  CLINICAL DATA:  Shortness of breath EXAM: PORTABLE CHEST 1 VIEW COMPARISON:  08/05/2015 FINDINGS: Cardiomegaly with perihilar and lower lobe airspace opacities  and layering effusions. Some improvement in aeration and airspace disease since prior study, likely improving edema. IMPRESSION: Mild CHF with some improvement since prior study. Electronically Signed   By: Rolm Baptise M.D.   On: 08/07/2015 08:12   Dg Chest Port 1 View  08/05/2015  CLINICAL DATA:  Pulmonary edema.  Shortness of breath. EXAM: PORTABLE CHEST 1 VIEW COMPARISON:  08/03/2015. FINDINGS: Cardiomegaly with bilateral pulmonary infiltrates consistent with pulmonary edema. Bilateral pleural effusions. No pneumothorax. No acute osseous abnormality . IMPRESSION: Congestive heart failure with prominent bilateral pulmonary edema and moderate bilateral effusions. Similar findings noted on prior exam. Electronically Signed   By: Marcello Moores  Register   On: 08/05/2015 07:25   Dg Chest Portable 1 View  08/03/2015  CLINICAL DATA:  64 year old female with shortness of breath EXAM: PORTABLE CHEST 1 VIEW COMPARISON:  Chest radiograph dated 01/06/2013 FINDINGS: Single portable view of the chest demonstrate cardiomegaly with bilateral interstitial and central vascular prominence most compatible with congestive changes. Superimposed pneumonia is not excluded. Small bilateral pleural effusions may be present. There is no pneumothorax. The osseous structures are grossly unremarkable. IMPRESSION: Cardiomegaly with congestive changes. Clinical correlation and follow-up recommended.  Electronically Signed   By: Anner Crete M.D.   On: 08/03/2015 21:58    Micro Results      Recent Results (from the past 240 hour(s))  MRSA PCR Screening     Status: None   Collection Time: 08/04/15  2:34 AM  Result Value Ref Range Status   MRSA by PCR NEGATIVE NEGATIVE Final    Comment:        The GeneXpert MRSA Assay (FDA approved for NASAL specimens only), is one component of a comprehensive MRSA colonization surveillance program. It is not intended to diagnose MRSA infection nor to guide or monitor treatment for MRSA  infections.     Today   Subjective    Ader Rieker today has no headache,no chest abdominal pain,no new weakness tingling or numbness, feels much better wants to go home today.    Objective   Blood pressure 125/71, pulse 60, temperature 97.8 F (36.6 C), temperature source Oral, resp. rate 20, height 5\' 4"  (1.626 m), weight 113.4 kg (250 lb), SpO2 97 %.   Intake/Output Summary (Last 24 hours) at 08/08/15 0951 Last data filed at 08/08/15 0946  Gross per 24 hour  Intake    510 ml  Output   3575 ml  Net  -3065 ml    Exam Awake Alert, Oriented x 3, No new F.N deficits, Normal affect [place].AT,PERRAL Supple Neck,No JVD, No cervical lymphadenopathy appriciated.  Symmetrical Chest wall movement, Good air movement bilaterally, few crackles RRR,No Gallops,Rubs or new Murmurs, No Parasternal Heave +ve B.Sounds, Abd Soft, Non tender, No organomegaly appriciated, No rebound -guarding or rigidity. No Cyanosis, Clubbing or edema, No new Rash or bruise   Data Review   CBC w Diff:  Lab Results  Component Value Date   WBC 11.3* 08/05/2015   HGB 8.5* 08/05/2015   HCT 25.2* 08/05/2015   PLT 336 08/05/2015   LYMPHOPCT 7 08/05/2015   MONOPCT 8 08/05/2015   EOSPCT 0 08/05/2015   BASOPCT 0 08/05/2015    CMP:  Lab Results  Component Value Date   NA 141 08/08/2015   K 3.6 08/08/2015   CL 98* 08/08/2015   CO2 28 08/08/2015   BUN 113* 08/08/2015   CREATININE 6.05* 08/08/2015   PROT 7.2 08/03/2015   ALBUMIN 3.3* 08/08/2015   BILITOT 0.9 08/03/2015   ALKPHOS 119 08/03/2015   AST 26 08/03/2015   ALT 21 08/03/2015  .   Total Time in preparing paper work, data evaluation and todays exam - 35 minutes  Thurnell Lose M.D on 08/08/2015 at 9:51 AM  [facility]   Office  [phone]

## 2015-08-08 NOTE — Progress Notes (Signed)
Subjective:  Stable overnight- she feels better- moving around- appetite OK- great UOP on decreased dose of lasix- crt just slightly higher- really wanting to go home for holiday  Objective Vital signs in last 24 hours: Filed Vitals:   08/07/15 0520 08/07/15 1358 08/07/15 2123 08/08/15 0506  BP:  125/59 140/60 125/71  Pulse:  60 64 60  Temp:  97.7 F (36.5 C) 98 F (36.7 C) 97.8 F (36.6 C)  TempSrc:  Oral Oral Oral  Resp:  20 18 20   Height:      Weight: 112.447 kg (247 lb 14.4 oz)   113.4 kg (250 lb)  SpO2:  98% 95% 97%   Weight change: 0.953 kg (2 lb 1.6 oz)  Intake/Output Summary (Last 24 hours) at 08/08/15 1032 Last data filed at 08/08/15 0946  Gross per 24 hour  Intake    510 ml  Output   3575 ml  Net  -3065 ml    Assessment/ Plan: Pt is a 64 y.o. yo female who was admitted on 08/03/2015 with volume overload and A on CRF   Assessment/Plan: 1. Renal- advanced CKD at baseline followed by Dr. Posey Pronto as OP- crt was 3.55 in October- pt s/p AVF but is small and likely not adequate for use at this time- OP procedure planned but put on hold due to tongue CA.  Now creatinine 6 in the setting of appropriate diuresis.  She does not seem uremic- no indications for HD.  Below EDW so may be getting a little dry? Hold lasix today then to start 80 BID PO as OP (was on as much as 160 IV q 6 here)- I told her to weigh herself daily at home and notify us if weight goes up abnormally- also call us if she should develop uremic sxms- will check labs next week and also arrange follow up with Posey Pronto 2. HTN/volume- volume status is better- she says below what her weight has been at home- she is on O2 as OP- never did take scheduled lasix as OP previously - rec dose of 80 BID PO as OP 3. Anemia- due to hosp and CKD- is on ESA- iron low as well - given one dose of iron here 4. Secondary hyperparathyroidism- PTH 326- high phos- on phoslo- added low dose vit D as well 5. Tongue CA- needs surgery [facility]- I  told her that she likely should not hold off too long but her kidneys are tenuous so certainly treatment for this could put her at risk for worsening- argument could also be made to just go ahead and put her on dialysis to get her tuned up before surgery- will discuss with Dr. Wendelyn Breslow A    Labs: Basic Metabolic Panel:  Recent Labs Lab 08/06/15 0534 08/07/15 0608 08/08/15 0332  NA 139  139 139 141  K 3.3*  3.3* 3.6 3.6  CL 100*  100* 98* 98*  CO2 24  26 27 28   GLUCOSE 117*  116* 112* 98  BUN 106*  106* 109* 113*  CREATININE 5.86*  5.89* 5.80* 6.05*  CALCIUM 8.3*  8.3* 8.5* 8.8*  PHOS 6.8* 6.6* 7.5*   Liver Function Tests:  Recent Labs Lab 08/03/15 2201 08/06/15 0534 08/07/15 0608 08/08/15 0332  AST 26  --   --   --   ALT 21  --   --   --   ALKPHOS 119  --   --   --   BILITOT 0.9  --   --   --  PROT 7.2  --   --   --   ALBUMIN 3.3* 3.0* 3.1* 3.3*   No results for input(s): LIPASE, AMYLASE in the last 168 hours. No results for input(s): AMMONIA in the last 168 hours. CBC:  Recent Labs Lab 08/03/15 2210 08/04/15 0307 08/05/15 0338  WBC  --  11.8* 11.3*  NEUTROABS  --   --  9.7*  HGB 9.9* 8.7* 8.5*  HCT 29.0* 27.4* 25.2*  MCV  --  86.4 85.7  PLT  --  313 336   Cardiac Enzymes:  Recent Labs Lab 08/03/15 2201 08/04/15 0307 08/04/15 0624  TROPONINI 0.06* 0.07* 0.06*   CBG:  Recent Labs Lab 08/07/15 0758 08/07/15 1219 08/07/15 1631 08/07/15 2121 08/08/15 0803  GLUCAP 96 140* 152* 134* 93    Iron Studies: No results for input(s): IRON, TIBC, TRANSFERRIN, FERRITIN in the last 72 hours. Studies/Results: Dg Chest Port 1 View  08/07/2015  CLINICAL DATA:  Shortness of breath EXAM: PORTABLE CHEST 1 VIEW COMPARISON:  08/05/2015 FINDINGS: Cardiomegaly with perihilar and lower lobe airspace opacities and layering effusions. Some improvement in aeration and airspace disease since prior study, likely improving edema. IMPRESSION:  Mild CHF with some improvement since prior study. Electronically Signed   By: Rolm Baptise M.D.   On: 08/07/2015 08:12   Medications: Infusions: . sodium chloride 10 mL/hr at 08/08/15 0209    Scheduled Medications: . amLODipine  10 mg Oral Daily  . antiseptic oral rinse  7 mL Mouth Rinse q12n4p  . aspirin  81 mg Oral Daily  . calcitRIOL  0.25 mcg Oral Daily  . calcium acetate  667 mg Oral TID WC  . chlorhexidine  15 mL Mouth Rinse BID  . darbepoetin (ARANESP) injection - NON-DIALYSIS  100 mcg Subcutaneous Q Wed-1800  . DULoxetine  30 mg Oral Daily  . DULoxetine  60 mg Oral QHS  . ferric gluconate (FERRLECIT/NULECIT) IV  125 mg Intravenous Daily  . heparin  5,000 Units Subcutaneous 3 times per day  . insulin aspart  0-5 Units Subcutaneous QHS  . insulin aspart  0-9 Units Subcutaneous TID WC  . insulin glargine  15 Units Subcutaneous QHS  . levothyroxine  150 mcg Oral QAC breakfast  . metoprolol tartrate  50 mg Oral BID  . pantoprazole  40 mg Oral QHS    have reviewed scheduled and prn medications.  Physical Exam: General: NAD Heart: RRR Lungs: dec BS at bases- poor effort Abdomen: obese, soft, non tender Extremities: some dependent edema Dialysis Access: AVF small  On left    08/08/2015,10:32 AM  LOS: 4 days

## 2015-08-08 NOTE — Progress Notes (Signed)
Nsg Discharge Note  Admit Date:  08/03/2015 Discharge date: 08/08/2015   Tami Elliott to be D/C'd Home per MD order.  AVS completed.  Copy for chart, and copy for patient signed, and dated. Patient/caregiver able to verbalize understanding.  Discharge Medication:   Medication List    TAKE these medications        amLODipine 10 MG tablet  Commonly known as:  NORVASC  TAKE 1 TABLET BY MOUTH DAILY     cetirizine 10 MG tablet  Commonly known as:  ZYRTEC  Take 10 mg by mouth daily as needed for allergies.     DULoxetine 30 MG capsule  Commonly known as:  CYMBALTA  Take 30-60 mg by mouth See admin instructions. Pt takes 30mg  in am and 60mg  in pm     ferrous sulfate 325 (65 FE) MG tablet  Take 325 mg by mouth daily with breakfast.     FLOVENT HFA 44 MCG/ACT inhaler  Generic drug:  fluticasone  Inhale 1 puff into the lungs 2 (two) times daily.     FLUTICASONE PROPIONATE (INHAL) IN  Inhale 1 spray into the lungs daily as needed (allergies).     furosemide 80 MG tablet  Commonly known as:  LASIX  Take 1 tablet (80 mg total) by mouth 2 (two) times daily. Fluid retention     HYDROcodone-acetaminophen 10-325 MG tablet  Commonly known as:  NORCO  Take 1 tablet by mouth every 6 (six) hours as needed.     insulin glargine 100 UNIT/ML injection  Commonly known as:  LANTUS  Inject 0.25 mLs (25 Units total) into the skin at bedtime.     levothyroxine 150 MCG tablet  Commonly known as:  SYNTHROID, LEVOTHROID  Take 150 mcg by mouth daily.     metoprolol 50 MG tablet  Commonly known as:  LOPRESSOR  Take 50 mg by mouth 2 (two) times daily.     Potassium Chloride ER 20 MEQ Tbcr  Take 20 mEq by mouth 2 (two) times daily.     ranitidine 300 MG tablet  Commonly known as:  ZANTAC  Take 300 mg by mouth daily as needed for heartburn.     TRULICITY 1.5 0000000 Sopn  Generic drug:  Dulaglutide  Inject 1.5 Units into the skin once a week.     vitamin B-12 100 MCG tablet   Commonly known as:  CYANOCOBALAMIN  Take 100 mcg by mouth daily.     vitamin C 500 MG tablet  Commonly known as:  ASCORBIC ACID  Take 500 mg by mouth daily.     Vitamin D3 2000 UNITS Tabs  Take 1 capsule by mouth daily.        Discharge Assessment: Filed Vitals:   08/07/15 2123 08/08/15 0506  BP: 140/60 125/71  Pulse: 64 60  Temp: 98 F (36.7 C) 97.8 F (36.6 C)  Resp: 18 20   Skin clean, dry and intact without evidence of skin break down, no evidence of skin tears noted. IV catheter discontinued intact. Site without signs and symptoms of complications - no redness or edema noted at insertion site, patient denies c/o pain - only slight tenderness at site.  Dressing with slight pressure applied.  D/c Instructions-Education: Discharge instructions given to patient/family with verbalized understanding. D/c education completed with patient/family including follow up instructions, medication list, d/c activities limitations if indicated, with other d/c instructions as indicated by MD - patient able to verbalize understanding, all questions fully answered. Patient instructed to  return to ED, call 911, or call MD for any changes in condition.  Patient escorted via [place], and D/C home via private auto.  Dayle Points, RN 08/08/2015 2:00 PM

## 2015-08-18 ENCOUNTER — Ambulatory Visit: Payer: Medicare Other

## 2015-08-26 ENCOUNTER — Telehealth: Payer: Self-pay | Admitting: *Deleted

## 2015-08-26 NOTE — Telephone Encounter (Signed)
  Oncology Nurse Navigator Documentation Navigator Location: [facility] (08/26/15 1501) Navigator Encounter Type: Telephone (08/26/15 1501) Telephone: Incoming Call (08/26/15 1501)                              Acuity: Level 1 (08/26/15 1501)     Tami Elliott called to provide update. She reported:  Surgery scheduled for 08/18/15 was cancelled bco 12/20-24/16 hospitalization and MD concern re kidney function.  She has had follow-up appt with Dr. Posey Pronto, nephrologist.  She was evaluated to have 10% kidney function.  She is having repeat labs tomorrow for further evaluation.  Currently on continuous O2 at 4L.  Has appt with PCP on 09/04/15. She will call me when surgery is rescheduled per clearance from Dr. Posey Pronto and her PCP.  Gayleen Orem, RN, BSN, [place] [facility] [phone]        Time Spent with Patient: 15 (08/26/15 1501)

## 2015-09-16 DIAGNOSIS — N186 End stage renal disease: Secondary | ICD-10-CM

## 2015-09-16 HISTORY — DX: End stage renal disease: N18.6

## 2015-09-23 ENCOUNTER — Ambulatory Visit: Payer: Medicare Other | Admitting: Podiatry

## 2015-09-30 ENCOUNTER — Encounter: Payer: Self-pay | Admitting: Podiatry

## 2015-09-30 ENCOUNTER — Ambulatory Visit (INDEPENDENT_AMBULATORY_CARE_PROVIDER_SITE_OTHER): Payer: Medicare Other | Admitting: Podiatry

## 2015-09-30 DIAGNOSIS — Z794 Long term (current) use of insulin: Secondary | ICD-10-CM | POA: Diagnosis not present

## 2015-09-30 DIAGNOSIS — B351 Tinea unguium: Secondary | ICD-10-CM

## 2015-09-30 DIAGNOSIS — M79676 Pain in unspecified toe(s): Secondary | ICD-10-CM | POA: Diagnosis not present

## 2015-09-30 DIAGNOSIS — E1140 Type 2 diabetes mellitus with diabetic neuropathy, unspecified: Secondary | ICD-10-CM

## 2015-09-30 NOTE — Progress Notes (Signed)
Patient ID: Tami Elliott, female   DOB: [birth year], 65 y.o.   MRN: [id number] Complaint:  Visit Type: Patient returns to my office for continued preventative foot care services. Complaint: Patient states" my nails have grown long and thick and become painful to walk and wear shoes" Patient has been diagnosed with DM with neuropathy.. The patient presents for preventative foot care services. No changes to ROS.  Patient is now on dialysis.  Podiatric Exam: Vascular: dorsalis pedis and posterior tibial pulses are palpable bilateral. Capillary return is immediate. Temperature gradient is WNL. Skin turgor WNL  Sensorium: Diminished  Semmes Weinstein monofilament test. Normal tactile sensation bilaterally. Nail Exam: Pt has thick disfigured discolored nails with subungual debris noted bilateral entire nail hallux through fifth toenails Ulcer Exam: There is no evidence of ulcer or pre-ulcerative changes or infection. Orthopedic Exam: Muscle tone and strength are WNL. No limitations in general ROM. No crepitus or effusions noted. Foot type and digits show no abnormalities. Bony prominences are unremarkable. Skin: No Porokeratosis. No infection or ulcers  Diagnosis:  Onychomycosis, , Pain in right toe, pain in left toes, Diabetic neuropathy  Treatment & Plan Procedures and Treatment: Consent by patient was obtained for treatment procedures. The patient understood the discussion of treatment and procedures well. All questions were answered thoroughly reviewed. Debridement of mycotic and hypertrophic toenails, 1 through 5 bilateral and clearing of subungual debris. No ulceration, no infection noted.  Return Visit-Office Procedure: Patient instructed to return to the office for a follow up visit 3 months for continued evaluation and treatment. RTC 3 mos   [facility]

## 2015-10-06 HISTORY — PX: OTHER SURGICAL HISTORY: SHX169

## 2015-10-07 ENCOUNTER — Encounter: Payer: Self-pay | Admitting: Surgery

## 2015-10-12 ENCOUNTER — Ambulatory Visit: Payer: Self-pay | Admitting: Surgery

## 2015-10-12 ENCOUNTER — Encounter (HOSPITAL_COMMUNITY): Payer: Self-pay

## 2015-10-21 ENCOUNTER — Inpatient Hospital Stay (HOSPITAL_COMMUNITY)
Admission: EM | Admit: 2015-10-21 | Discharge: 2015-10-30 | DRG: 393 | Disposition: A | Discharge status: Home / Self Care | Payer: Medicare Other | Attending: Internal Medicine | Admitting: Internal Medicine

## 2015-10-21 ENCOUNTER — Encounter (HOSPITAL_COMMUNITY): Payer: Self-pay

## 2015-10-21 DIAGNOSIS — K5903 Drug induced constipation: Secondary | ICD-10-CM | POA: Diagnosis present

## 2015-10-21 DIAGNOSIS — I471 Supraventricular tachycardia: Secondary | ICD-10-CM | POA: Diagnosis not present

## 2015-10-21 DIAGNOSIS — C029 Malignant neoplasm of tongue, unspecified: Secondary | ICD-10-CM | POA: Diagnosis present

## 2015-10-21 DIAGNOSIS — K921 Melena: Secondary | ICD-10-CM | POA: Diagnosis present

## 2015-10-21 DIAGNOSIS — E039 Hypothyroidism, unspecified: Secondary | ICD-10-CM | POA: Diagnosis present

## 2015-10-21 DIAGNOSIS — E8770 Fluid overload, unspecified: Secondary | ICD-10-CM | POA: Diagnosis present

## 2015-10-21 DIAGNOSIS — G4730 Sleep apnea, unspecified: Secondary | ICD-10-CM | POA: Diagnosis present

## 2015-10-21 DIAGNOSIS — K219 Gastro-esophageal reflux disease without esophagitis: Secondary | ICD-10-CM | POA: Diagnosis present

## 2015-10-21 DIAGNOSIS — K644 Residual hemorrhoidal skin tags: Secondary | ICD-10-CM | POA: Diagnosis present

## 2015-10-21 DIAGNOSIS — D631 Anemia in chronic kidney disease: Secondary | ICD-10-CM | POA: Diagnosis present

## 2015-10-21 DIAGNOSIS — I959 Hypotension, unspecified: Secondary | ICD-10-CM | POA: Diagnosis present

## 2015-10-21 DIAGNOSIS — Z833 Family history of diabetes mellitus: Secondary | ICD-10-CM

## 2015-10-21 DIAGNOSIS — T40605A Adverse effect of unspecified narcotics, initial encounter: Secondary | ICD-10-CM | POA: Diagnosis present

## 2015-10-21 DIAGNOSIS — K649 Unspecified hemorrhoids: Secondary | ICD-10-CM | POA: Diagnosis not present

## 2015-10-21 DIAGNOSIS — R05 Cough: Secondary | ICD-10-CM

## 2015-10-21 DIAGNOSIS — Z794 Long term (current) use of insulin: Secondary | ICD-10-CM | POA: Diagnosis not present

## 2015-10-21 DIAGNOSIS — Z79899 Other long term (current) drug therapy: Secondary | ICD-10-CM | POA: Diagnosis not present

## 2015-10-21 DIAGNOSIS — Z6838 Body mass index (BMI) 38.0-38.9, adult: Secondary | ICD-10-CM

## 2015-10-21 DIAGNOSIS — R059 Cough, unspecified: Secondary | ICD-10-CM

## 2015-10-21 DIAGNOSIS — I120 Hypertensive chronic kidney disease with stage 5 chronic kidney disease or end stage renal disease: Secondary | ICD-10-CM | POA: Diagnosis present

## 2015-10-21 DIAGNOSIS — Z9842 Cataract extraction status, left eye: Secondary | ICD-10-CM

## 2015-10-21 DIAGNOSIS — Z7982 Long term (current) use of aspirin: Secondary | ICD-10-CM

## 2015-10-21 DIAGNOSIS — E669 Obesity, unspecified: Secondary | ICD-10-CM | POA: Diagnosis present

## 2015-10-21 DIAGNOSIS — K626 Ulcer of anus and rectum: Secondary | ICD-10-CM | POA: Diagnosis present

## 2015-10-21 DIAGNOSIS — F329 Major depressive disorder, single episode, unspecified: Secondary | ICD-10-CM | POA: Diagnosis present

## 2015-10-21 DIAGNOSIS — E1140 Type 2 diabetes mellitus with diabetic neuropathy, unspecified: Secondary | ICD-10-CM | POA: Diagnosis present

## 2015-10-21 DIAGNOSIS — E1122 Type 2 diabetes mellitus with diabetic chronic kidney disease: Secondary | ICD-10-CM | POA: Diagnosis present

## 2015-10-21 DIAGNOSIS — Z992 Dependence on renal dialysis: Secondary | ICD-10-CM

## 2015-10-21 DIAGNOSIS — D62 Acute posthemorrhagic anemia: Secondary | ICD-10-CM | POA: Diagnosis present

## 2015-10-21 DIAGNOSIS — K922 Gastrointestinal hemorrhage, unspecified: Secondary | ICD-10-CM | POA: Diagnosis present

## 2015-10-21 DIAGNOSIS — N2581 Secondary hyperparathyroidism of renal origin: Secondary | ICD-10-CM | POA: Diagnosis present

## 2015-10-21 DIAGNOSIS — K648 Other hemorrhoids: Secondary | ICD-10-CM | POA: Diagnosis present

## 2015-10-21 DIAGNOSIS — Z86010 Personal history of colonic polyps: Secondary | ICD-10-CM

## 2015-10-21 DIAGNOSIS — Z85810 Personal history of malignant neoplasm of tongue: Secondary | ICD-10-CM | POA: Diagnosis not present

## 2015-10-21 DIAGNOSIS — N186 End stage renal disease: Secondary | ICD-10-CM | POA: Diagnosis present

## 2015-10-21 HISTORY — DX: Ulcer of anus and rectum: K62.6

## 2015-10-21 HISTORY — DX: End stage renal disease: N18.6

## 2015-10-21 LAB — I-STAT CHEM 8, ED
BUN: 35 mg/dL — ABNORMAL HIGH (ref 6–20)
CHLORIDE: 92 mmol/L — AB (ref 101–111)
Calcium, Ion: 0.99 mmol/L — ABNORMAL LOW (ref 1.13–1.30)
Creatinine, Ser: 5.9 mg/dL — ABNORMAL HIGH (ref 0.44–1.00)
Glucose, Bld: 153 mg/dL — ABNORMAL HIGH (ref 65–99)
HEMATOCRIT: 26 % — AB (ref 36.0–46.0)
Hemoglobin: 8.8 g/dL — ABNORMAL LOW (ref 12.0–15.0)
POTASSIUM: 3.6 mmol/L (ref 3.5–5.1)
SODIUM: 136 mmol/L (ref 135–145)
TCO2: 29 mmol/L (ref 0–100)

## 2015-10-21 LAB — PROTIME-INR
INR: 1.34 (ref 0.00–1.49)
PROTHROMBIN TIME: 16.7 s — AB (ref 11.6–15.2)

## 2015-10-21 LAB — ABO/RH: ABO/RH(D): O POS

## 2015-10-21 LAB — CBC WITH DIFFERENTIAL/PLATELET
BASOS ABS: 0.1 10*3/uL (ref 0.0–0.1)
Basophils Relative: 0 %
EOS ABS: 0.1 10*3/uL (ref 0.0–0.7)
EOS PCT: 0 %
HCT: 31.5 % — ABNORMAL LOW (ref 36.0–46.0)
Hemoglobin: 10.2 g/dL — ABNORMAL LOW (ref 12.0–15.0)
LYMPHS PCT: 7 %
Lymphs Abs: 0.9 10*3/uL (ref 0.7–4.0)
MCH: 30.6 pg (ref 26.0–34.0)
MCHC: 32.4 g/dL (ref 30.0–36.0)
MCV: 94.6 fL (ref 78.0–100.0)
Monocytes Absolute: 0.7 10*3/uL (ref 0.1–1.0)
Monocytes Relative: 6 %
NEUTROS PCT: 87 %
Neutro Abs: 10.8 10*3/uL — ABNORMAL HIGH (ref 1.7–7.7)
PLATELETS: 323 10*3/uL (ref 150–400)
RBC: 3.33 MIL/uL — AB (ref 3.87–5.11)
RDW: 17.7 % — ABNORMAL HIGH (ref 11.5–15.5)
WBC: 12.4 10*3/uL — AB (ref 4.0–10.5)

## 2015-10-21 LAB — COMPREHENSIVE METABOLIC PANEL
ALT: 8 U/L — ABNORMAL LOW (ref 14–54)
AST: 18 U/L (ref 15–41)
Albumin: 2.5 g/dL — ABNORMAL LOW (ref 3.5–5.0)
Alkaline Phosphatase: 85 U/L (ref 38–126)
Anion gap: 16 — ABNORMAL HIGH (ref 5–15)
BUN: 35 mg/dL — ABNORMAL HIGH (ref 6–20)
CHLORIDE: 94 mmol/L — AB (ref 101–111)
CO2: 28 mmol/L (ref 22–32)
Calcium: 8.6 mg/dL — ABNORMAL LOW (ref 8.9–10.3)
Creatinine, Ser: 6.39 mg/dL — ABNORMAL HIGH (ref 0.44–1.00)
GFR, EST AFRICAN AMERICAN: 7 mL/min — AB (ref >60)
GFR, EST NON AFRICAN AMERICAN: 6 mL/min — AB (ref >60)
Glucose, Bld: 157 mg/dL — ABNORMAL HIGH (ref 65–99)
POTASSIUM: 3.7 mmol/L (ref 3.5–5.1)
Sodium: 138 mmol/L (ref 135–145)
Total Bilirubin: 0.7 mg/dL (ref 0.3–1.2)
Total Protein: 7.1 g/dL (ref 6.5–8.1)

## 2015-10-21 MED ORDER — BUDESONIDE 0.25 MG/2ML IN SUSP
0.2500 mg | Freq: Two times a day (BID) | RESPIRATORY_TRACT | Status: DC
Start: 2015-10-21 — End: 2015-10-30
  Filled 2015-10-21 (×4): qty 2

## 2015-10-21 MED ORDER — HYDROCODONE-ACETAMINOPHEN 10-325 MG PO TABS
1.0000 | ORAL_TABLET | Freq: Four times a day (QID) | ORAL | Status: DC | PRN
  Administered 2015-10-22 – 2015-10-25 (×2): 1 via ORAL
  Filled 2015-10-21 (×2): qty 1

## 2015-10-21 MED ORDER — ACETAMINOPHEN 325 MG PO TABS
650.0000 mg | ORAL_TABLET | Freq: Four times a day (QID) | ORAL | Status: DC | PRN
  Administered 2015-10-22: 325 mg via ORAL

## 2015-10-21 MED ORDER — LEVOTHYROXINE SODIUM 75 MCG PO TABS
150.0000 ug | ORAL_TABLET | Freq: Every day | ORAL | Status: DC
  Administered 2015-10-22 – 2015-10-30 (×9): 150 ug via ORAL
  Filled 2015-10-21 (×11): qty 2

## 2015-10-21 MED ORDER — DULOXETINE HCL 60 MG PO CPEP
60.0000 mg | ORAL_CAPSULE | Freq: Every day | ORAL | Status: DC
  Administered 2015-10-22 – 2015-10-29 (×9): 60 mg via ORAL
  Filled 2015-10-21 (×9): qty 1

## 2015-10-21 MED ORDER — DULOXETINE HCL 30 MG PO CPEP: 30.0000 mg | ORAL_CAPSULE | ORAL | Status: DC

## 2015-10-21 MED ORDER — INSULIN ASPART 100 UNIT/ML ~~LOC~~ SOLN
0.0000 [IU] | SUBCUTANEOUS | Status: DC
  Administered 2015-10-28 (×2): 1 [IU] via SUBCUTANEOUS

## 2015-10-21 MED ORDER — FAMOTIDINE 20 MG PO TABS
20.0000 mg | ORAL_TABLET | Freq: Two times a day (BID) | ORAL | Status: DC
  Administered 2015-10-22 (×2): 20 mg via ORAL
  Filled 2015-10-21 (×2): qty 1

## 2015-10-21 MED ORDER — ACETAMINOPHEN 650 MG RE SUPP: 650.0000 mg | Freq: Four times a day (QID) | RECTAL | Status: DC | PRN

## 2015-10-21 MED ORDER — INSULIN GLARGINE 100 UNIT/ML ~~LOC~~ SOLN
10.0000 [IU] | Freq: Every day | SUBCUTANEOUS | Status: DC
  Administered 2015-10-22 – 2015-10-29 (×6): 10 [IU] via SUBCUTANEOUS
  Filled 2015-10-21 (×10): qty 0.1

## 2015-10-21 MED ORDER — FENTANYL CITRATE (PF) 100 MCG/2ML IJ SOLN
50.0000 ug | Freq: Once | INTRAMUSCULAR | Status: AC
  Administered 2015-10-21: 50 ug via INTRAVENOUS
  Filled 2015-10-21: qty 2

## 2015-10-21 MED ORDER — SODIUM CHLORIDE 0.9 % IV SOLN
80.0000 mg | Freq: Once | INTRAVENOUS | Status: AC
  Administered 2015-10-21: 80 mg via INTRAVENOUS
  Filled 2015-10-21: qty 80

## 2015-10-21 MED ORDER — PANTOPRAZOLE SODIUM 40 MG IV SOLR
40.0000 mg | Freq: Two times a day (BID) | INTRAVENOUS | Status: DC
  Administered 2015-10-22 – 2015-10-25 (×9): 40 mg via INTRAVENOUS
  Filled 2015-10-21 (×10): qty 40

## 2015-10-21 MED ORDER — ONDANSETRON HCL 4 MG PO TABS: 4.0000 mg | ORAL_TABLET | Freq: Four times a day (QID) | ORAL | Status: DC | PRN

## 2015-10-21 MED ORDER — SODIUM CHLORIDE 0.9 % IV SOLN
INTRAVENOUS | Status: AC
  Administered 2015-10-22: 03:00:00 via INTRAVENOUS

## 2015-10-21 MED ORDER — ONDANSETRON HCL 4 MG/2ML IJ SOLN
4.0000 mg | Freq: Four times a day (QID) | INTRAMUSCULAR | Status: DC | PRN
  Administered 2015-10-23 (×2): 4 mg via INTRAVENOUS
  Filled 2015-10-21 (×2): qty 2

## 2015-10-21 MED ORDER — DULOXETINE HCL 30 MG PO CPEP
30.0000 mg | ORAL_CAPSULE | Freq: Every day | ORAL | Status: DC
  Administered 2015-10-22 – 2015-10-30 (×9): 30 mg via ORAL
  Filled 2015-10-21 (×10): qty 1

## 2015-10-21 NOTE — ED Provider Notes (Signed)
CSN: [id number]     Arrival date & time 10/21/15  1904 History   First MD Initiated Contact with Patient 10/21/15 1904     Chief Complaint  Patient presents with  . GI Bleeding     (Consider location/radiation/quality/duration/timing/severity/associated sxs/prior Treatment) HPI  65 year old female presents by EMS with 2 episodes of rectal bleeding. Patient states she had a red blood clot about the size of her fist with stool at about 4:30 pm. Then 1 hour later had another. Has felt fatigued more than normal today. Last had dialysis yesterday. Felt nauseated today, dialysis called her in something and now that has resolved. No prior history of GI bleeding. Hx of hemorrhoid 11 years ago. Is having some rectal pain. No abdominal pain. EMS noted her to be orthostatic, BP 94 while sitting, 70 when standing. Feels better with 250 cc IVF by EMS. Takes a baby aspirin but no other blood thinners. Last colonoscopy by [facility] in 2011, no reported abnormalities per her.  Past Medical History  Diagnosis Date  . Diabetes mellitus   . Hypertension   . Hypothyroidism   . Cataract     Left eye( Dr. Katy Fitch, patient uninsured so she can't  get  an intervention)  . Chronic kidney disease     nephrolithiasis, s/p removal 12/05/07  . Back pain     myofascial, excacerberated in 6-7/09, 9/09- required narcotics at those times  . Elbow fracture, right   . Obesity   . Depression     better on nortriptyline  . Hyperplastic colon polyp 2001    f/u colonoscopy 11/23/06 was normal( Dr. Carlean Purl), falls into normal risk screening( next colonoscopy due in 2018)  . Pap smear for cervical cancer screening     12/14/2005: normal, evidence of candida. 05/16/2007: normal, benign reperative changes.  . Anemia   . Sleep apnea   . Pneumonia     as a child  . GERD (gastroesophageal reflux disease)   . Arthritis   . 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 Nephrolithiasis 11/2007, 01/2012  . Cancer  ([place]) 06/08/15 bx    SCC of left lateral tongue   . Acute pulmonary edema ([place]) 08/03/2015   Past Surgical History  Procedure Laterality Date  . Cataract extraction Left   . Lithotripsy  11/2007  . Dilation and curettage of uterus    . Av fistula placement Left 05/12/2015    Procedure: ARTERIOVENOUS (AV) FISTULA CREATION;  Surgeon: Serafina Mitchell, MD;  Location: [facility];  Service: Vascular;  Laterality: Left;  . Av fistula placement  05/12/15    left fore arm   Family History  Problem Relation Age of Onset  . Diabetes Mother   . Heart disease Mother     before age 26  . Hypertension Mother    Social History  Substance Use Topics  . Smoking status: Never Smoker   . Smokeless tobacco: Never Used  . Alcohol Use: No   OB History    No data available     Review of Systems  Constitutional: Positive for fatigue.  Respiratory: Negative for shortness of breath.   Cardiovascular: Negative for chest pain.  Gastrointestinal: Positive for nausea, blood in stool and rectal pain. Negative for vomiting and abdominal pain.  All other systems reviewed and are negative.     Allergies  Review of patient's allergies indicates no known allergies.  Home Medications   Prior to Admission medications  Medication Sig Start Date End Date Taking? Authorizing Provider  calcium carbonate (TUMS - DOSED IN MG ELEMENTAL CALCIUM) 500 MG chewable tablet Chew 2 tablets by mouth daily as needed for indigestion or heartburn.   Yes Historical Provider, MD  cetirizine (ZYRTEC) 10 MG tablet Take 10 mg by mouth daily as needed for allergies.  04/02/15  Yes Historical Provider, MD  Cholecalciferol (VITAMIN D3) 2000 UNITS TABS Take 1 capsule by mouth daily.   Yes Historical Provider, MD  Cyanocobalamin (VITAMIN B-12) 1000 MCG SUBL Place 1 tablet under the tongue daily.   Yes Historical Provider, MD  DULoxetine (CYMBALTA) 30 MG capsule Take 30-60 mg by mouth See admin instructions. Pt takes 30mg  in am and 60mg  in  pm   Yes Historical Provider, MD  fluticasone (FLOVENT HFA) 44 MCG/ACT inhaler Inhale 1 puff into the lungs 2 (two) times daily.   Yes Historical Provider, MD  furosemide (LASIX) 80 MG tablet Take 1 tablet (80 mg total) by mouth 2 (two) times daily. Fluid retention 08/08/15  Yes Thurnell Lose, MD  HYDROcodone-acetaminophen (NORCO) 10-325 MG tablet Take 1 tablet by mouth every 6 (six) hours as needed. Patient taking differently: Take 1 tablet by mouth every 6 (six) hours as needed for moderate pain.  05/12/15  Yes Samantha J Rhyne, PA-C  insulin glargine (LANTUS) 100 UNIT/ML injection Inject 0.25 mLs (25 Units total) into the skin at bedtime. 08/08/15  Yes Thurnell Lose, MD  levothyroxine (SYNTHROID, LEVOTHROID) 150 MCG tablet Take 150 mcg by mouth daily.     Yes Historical Provider, MD  metoprolol (LOPRESSOR) 50 MG tablet Take 50 mg by mouth 2 (two) times daily.   Yes Historical Provider, MD  ranitidine (ZANTAC) 300 MG tablet Take 300 mg by mouth daily as needed for heartburn.   Yes Historical Provider, MD  vitamin C (ASCORBIC ACID) 500 MG tablet Take 500 mg by mouth daily.   Yes Historical Provider, MD  potassium chloride 20 MEQ TBCR Take 20 mEq by mouth 2 (two) times daily. Patient not taking: Reported on 10/21/2015 08/08/15   Thurnell Lose, MD   BP 129/69 mmHg  Pulse 90  Temp(Src) 98.5 F (36.9 C) (Oral)  Resp 14  Ht 5\' 4"  (1.626 m)  SpO2 100% Physical Exam  Constitutional: She is oriented to person, place, and time. She appears well-developed and well-nourished. No distress.  obese  HENT:  Head: Normocephalic and atraumatic.  Right Ear: External ear normal.  Left Ear: External ear normal.  Nose: Nose normal.  Eyes: Right eye exhibits no discharge. Left eye exhibits no discharge.  Neck:    Cardiovascular: Normal rate, regular rhythm and normal heart sounds.   Pulmonary/Chest: Effort normal and breath sounds normal.  Abdominal: Soft. There is no tenderness.  Genitourinary:  Rectal exam shows tenderness. Guaiac positive stool (bright red blood with clots).     Neurological: She is alert and oriented to person, place, and time.  Skin: Skin is warm and dry. She is not diaphoretic.  Nursing note and vitals reviewed.   ED Course  Procedures (including critical care time) Labs Review Labs Reviewed  COMPREHENSIVE METABOLIC PANEL - Abnormal; Notable for the following:    Chloride 94 (*)    Glucose, Bld 157 (*)    BUN 35 (*)    Creatinine, Ser 6.39 (*)    Calcium 8.6 (*)    Albumin 2.5 (*)    ALT 8 (*)    GFR calc non Af Amer 6 (*)  GFR calc Af Amer 7 (*)    Anion gap 16 (*)    All other components within normal limits  CBC WITH DIFFERENTIAL/PLATELET - Abnormal; Notable for the following:    WBC 12.4 (*)    RBC 3.33 (*)    Hemoglobin 10.2 (*)    HCT 31.5 (*)    RDW 17.7 (*)    Neutro Abs 10.8 (*)    All other components within normal limits  PROTIME-INR - Abnormal; Notable for the following:    Prothrombin Time 16.7 (*)    All other components within normal limits  GLUCOSE, CAPILLARY - Abnormal; Notable for the following:    Glucose-Capillary 110 (*)    All other components within normal limits  I-STAT CHEM 8, ED - Abnormal; Notable for the following:    Chloride 92 (*)    BUN 35 (*)    Creatinine, Ser 5.90 (*)    Glucose, Bld 153 (*)    Calcium, Ion 0.99 (*)    Hemoglobin 8.8 (*)    HCT 26.0 (*)    All other components within normal limits  CBC  CBC  CBC  COMPREHENSIVE METABOLIC PANEL  POC OCCULT BLOOD, ED  TYPE AND SCREEN  ABO/RH    Imaging Review No results found. I have personally reviewed and evaluated these images and lab results as part of my medical decision-making.   EKG Interpretation None      MDM   Final diagnoses:  Acute GI bleeding    Patient with acute rectal bleeding. Does not appear to be coming from hemorrhoid which while firm does not appear thrombosed. Appears to be colonic. Possibly diverticular given  no pain. Has remained hemodynamically stable since initial small fluid bolus by EMS. Will need serial hemoglobins and GI consult. Admit to hospitalist.    Sherwood Gambler, MD 10/22/15 [phone]

## 2015-10-21 NOTE — ED Notes (Signed)
MD at bedside. 

## 2015-10-21 NOTE — ED Notes (Signed)
PER EMS: pt here for rectal bleeding. She had a BM this morning at 1630 and noticed she passed a large clot, "the size of a baseball." Second episode an hour later and passed another large clot and began to feel nauseous. EMS reports large amount of blood in her toilet. Pt denies current nausea and denies pain right now. EMS reports pt was orthostatic, BP was 94 systolic and dropped to 70 systolic upon standing and HR increased from 110 to 130. Total of 200 cc NS en route.

## 2015-10-21 NOTE — ED Notes (Signed)
RN attempted to call report to floor; RN to call back  

## 2015-10-21 NOTE — H&P (Signed)
[facility] History and Physical  Tami Elliott [id number] DOB: 08/10/1951 DOA: 10/21/2015  Referring physician: Alinda Sierras. PCP: Harvie Junior, MD  Specialists: [facility] for Tongue cancer.  Chief Complaint: Rectal bleeding.  HPI: Tami Elliott is a 65 y.o. female with history of ESRD on hemodialysis, diabetes mellitus type 2, tongue cancer who has had recent surgery at [facility] and had staples removed last week presents to the ER because of rectal bleeding. Patient had 2 episodes of rectal bleeding last evening. Patient states she passed large clots. Both episodes where one hour apart. When EMSpatient was mildly hypotensive for which patient was given a fluid bolus. In the ER patient has not had any further episodes. Patient states she has had some hemorrhoidal issues last week for which she had taken medications topically. Denies any nausea vomiting chest pain or shortness of breath. Patient has been admitted for further management of rectal bleeding most likely lower GI. Patient only takes aspirin. Denies taking any NSAIDs. Has had colonoscopy in 2011 which was reported normal.   Review of Systems: As presented in the history of presenting illness, rest negative.  Past Medical History  Diagnosis Date  . Diabetes mellitus   . Hypertension   . Hypothyroidism   . Cataract     Left eye( Dr. Katy Fitch, patient uninsured so she can't  get  an intervention)  . Chronic kidney disease     nephrolithiasis, s/p removal 12/05/07  . Back pain     myofascial, excacerberated in 6-7/09, 9/09- required narcotics at those times  . Elbow fracture, right   . Obesity   . Depression     better on nortriptyline  . Hyperplastic colon polyp 2001    f/u colonoscopy 11/23/06 was normal( Dr. Carlean Purl), falls into normal risk screening( next colonoscopy due in 2018)  . Pap smear for cervical cancer screening     12/14/2005: normal, evidence of candida. 05/16/2007: normal, benign  reperative changes.  . Anemia   . Sleep apnea   . Pneumonia     as a child  . GERD (gastroesophageal reflux disease)   . Arthritis   . 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 Nephrolithiasis 11/2007, 01/2012  . Cancer ([place]) 06/08/15 bx    SCC of left lateral tongue   . Acute pulmonary edema ([place]) 08/03/2015   Past Surgical History  Procedure Laterality Date  . Cataract extraction Left   . Lithotripsy  11/2007  . Dilation and curettage of uterus    . Av fistula placement Left 05/12/2015    Procedure: ARTERIOVENOUS (AV) FISTULA CREATION;  Surgeon: Serafina Mitchell, MD;  Location: [facility];  Service: Vascular;  Laterality: Left;  . Av fistula placement  05/12/15    left fore arm   Social History:  reports that she has never smoked. She has never used smokeless tobacco. She reports that she does not drink alcohol or use illicit drugs. Where does patient live home. Can patient participate in ADLs? Yes.  No Known Allergies  Family History:  Family History  Problem Relation Age of Onset  . Diabetes Mother   . Heart disease Mother     before age 29  . Hypertension Mother       Prior to Admission medications   Medication Sig Start Date End Date Taking? Authorizing Provider  calcium carbonate (TUMS - DOSED IN MG ELEMENTAL CALCIUM) 500 MG chewable tablet Chew 2 tablets by mouth daily as  needed for indigestion or heartburn.   Yes Historical Provider, MD  cetirizine (ZYRTEC) 10 MG tablet Take 10 mg by mouth daily as needed for allergies.  04/02/15  Yes Historical Provider, MD  Cholecalciferol (VITAMIN D3) 2000 UNITS TABS Take 1 capsule by mouth daily.   Yes Historical Provider, MD  Cyanocobalamin (VITAMIN B-12) 1000 MCG SUBL Place 1 tablet under the tongue daily.   Yes Historical Provider, MD  DULoxetine (CYMBALTA) 30 MG capsule Take 30-60 mg by mouth See admin instructions. Pt takes 30mg  in am and 60mg  in pm   Yes Historical Provider, MD  fluticasone (FLOVENT HFA) 44  MCG/ACT inhaler Inhale 1 puff into the lungs 2 (two) times daily.   Yes Historical Provider, MD  furosemide (LASIX) 80 MG tablet Take 1 tablet (80 mg total) by mouth 2 (two) times daily. Fluid retention 08/08/15  Yes Thurnell Lose, MD  HYDROcodone-acetaminophen (NORCO) 10-325 MG tablet Take 1 tablet by mouth every 6 (six) hours as needed. Patient taking differently: Take 1 tablet by mouth every 6 (six) hours as needed for moderate pain.  05/12/15  Yes Samantha J Rhyne, PA-C  insulin glargine (LANTUS) 100 UNIT/ML injection Inject 0.25 mLs (25 Units total) into the skin at bedtime. 08/08/15  Yes Thurnell Lose, MD  levothyroxine (SYNTHROID, LEVOTHROID) 150 MCG tablet Take 150 mcg by mouth daily.     Yes Historical Provider, MD  metoprolol (LOPRESSOR) 50 MG tablet Take 50 mg by mouth 2 (two) times daily.   Yes Historical Provider, MD  ranitidine (ZANTAC) 300 MG tablet Take 300 mg by mouth daily as needed for heartburn.   Yes Historical Provider, MD  vitamin C (ASCORBIC ACID) 500 MG tablet Take 500 mg by mouth daily.   Yes Historical Provider, MD  potassium chloride 20 MEQ TBCR Take 20 mEq by mouth 2 (two) times daily. Patient not taking: Reported on 10/21/2015 08/08/15   Thurnell Lose, MD    Physical Exam: Filed Vitals:   10/21/15 2030 10/21/15 2100 10/21/15 2130 10/21/15 2200  BP: 139/80 159/79 141/80 164/87  Pulse: 96 93 94 98  Resp: 18 20 19 15   SpO2: 94% 93% 96% 100%     General:  Moderately built and nourished.  Eyes: Anicteric no pallor.  ENT: No discharge from the ears eyes nose or mouth.  Neck: No mass felt. No JVD appreciated. Left side of the neck has sutures.  Cardiovascular: S1-S2 heard.  Respiratory: No rhonchi or crepitations.   Abdomen: Soft nontender bowel sounds present.  Skin: No rash.  Musculoskeletal: No edema.  Psychiatric: Appears normal.  Neurologic: Alert awake oriented to time place and person. Moves all extremities.  Labs on Admission:  Basic  Metabolic Panel:  Recent Labs Lab 10/21/15 1930 10/21/15 1949  NA 138 136  K 3.7 3.6  CL 94* 92*  CO2 28  --   GLUCOSE 157* 153*  BUN 35* 35*  CREATININE 6.39* 5.90*  CALCIUM 8.6*  --    Liver Function Tests:  Recent Labs Lab 10/21/15 1930  AST 18  ALT 8*  ALKPHOS 85  BILITOT 0.7  PROT 7.1  ALBUMIN 2.5*   No results for input(s): LIPASE, AMYLASE in the last 168 hours. No results for input(s): AMMONIA in the last 168 hours. CBC:  Recent Labs Lab 10/21/15 1930 10/21/15 1949  WBC 12.4*  --   NEUTROABS 10.8*  --   HGB 10.2* 8.8*  HCT 31.5* 26.0*  MCV 94.6  --   PLT 323  --  Cardiac Enzymes: No results for input(s): CKTOTAL, CKMB, CKMBINDEX, TROPONINI in the last 168 hours.  BNP (last 3 results)  Recent Labs  08/03/15 2201  BNP 540.5*    ProBNP (last 3 results) No results for input(s): PROBNP in the last 8760 hours.  CBG: No results for input(s): GLUCAP in the last 168 hours.  Radiological Exams on Admission: No results found.   Assessment/Plan Principal Problem:   Acute GI bleeding Active Problems:   Hypothyroidism   Type 2 diabetes mellitus with diabetic neuropathy, with long-term current use of insulin (HCC)   Tongue cancer (HCC)   GI bleeding   Acute blood loss anemia   1. Acute GI bleed - suspect most likely lower GI bleed. Patient will be kept nothing by mouth in anticipation of procedure. Consult gastroenterologist in a.m. Follow CBC. Discontinue aspirin. 2. Acute blood loss anemia - follow CBC. 3. History of hypertension - since patient was mildly hypotensive initially patient's antihypertensives metoprolol is on hold. 4. ESRD on hemodialysis - consult nephrologist for dialysis. 5. Diabetes mellitus type 2 -since patient will be nothing by mouth I have decrease patient's Lantus dose from 25 units to 10 units nightly. Once patient takes only restart home dose. 6. History of tongue cancer status post recent surgery staples removed last  week. 7. Hypothyroidism on Synthroid.    DVT Prophylaxis - SCDs.  Code Status: Full code.  Family Communication: Discussed with patient.  Disposition Plan: Admit to inpatient.    KAKRAKANDY,ARSHAD N. [facility] Pager [phone].  If 7PM-7AM, please contact night-coverage [email or web address] Password [email or web address] 10/21/2015, 10:45 PM

## 2015-10-21 NOTE — ED Notes (Signed)
Admitting Md at bedside

## 2015-10-22 DIAGNOSIS — D62 Acute posthemorrhagic anemia: Secondary | ICD-10-CM

## 2015-10-22 DIAGNOSIS — K922 Gastrointestinal hemorrhage, unspecified: Secondary | ICD-10-CM

## 2015-10-22 LAB — CBC
HEMATOCRIT: 22.4 % — AB (ref 36.0–46.0)
HEMATOCRIT: 22.6 % — AB (ref 36.0–46.0)
HEMATOCRIT: 23.5 % — AB (ref 36.0–46.0)
HEMOGLOBIN: 7.3 g/dL — AB (ref 12.0–15.0)
HEMOGLOBIN: 7.8 g/dL — AB (ref 12.0–15.0)
Hemoglobin: 7.2 g/dL — ABNORMAL LOW (ref 12.0–15.0)
MCH: 30.5 pg (ref 26.0–34.0)
MCH: 30.7 pg (ref 26.0–34.0)
MCH: 31.3 pg (ref 26.0–34.0)
MCHC: 32.1 g/dL (ref 30.0–36.0)
MCHC: 32.3 g/dL (ref 30.0–36.0)
MCHC: 33.2 g/dL (ref 30.0–36.0)
MCV: 94.4 fL (ref 78.0–100.0)
MCV: 94.9 fL (ref 78.0–100.0)
MCV: 95 fL (ref 78.0–100.0)
Platelets: 284 10*3/uL (ref 150–400)
Platelets: 294 10*3/uL (ref 150–400)
Platelets: 339 10*3/uL (ref 150–400)
RBC: 2.36 MIL/uL — ABNORMAL LOW (ref 3.87–5.11)
RBC: 2.38 MIL/uL — AB (ref 3.87–5.11)
RBC: 2.49 MIL/uL — AB (ref 3.87–5.11)
RDW: 17.6 % — ABNORMAL HIGH (ref 11.5–15.5)
RDW: 17.8 % — AB (ref 11.5–15.5)
RDW: 17.8 % — ABNORMAL HIGH (ref 11.5–15.5)
WBC: 10 10*3/uL (ref 4.0–10.5)
WBC: 11.5 10*3/uL — AB (ref 4.0–10.5)
WBC: 8.6 10*3/uL (ref 4.0–10.5)

## 2015-10-22 LAB — POCT PREGNANCY, URINE: PREG TEST UR: POSITIVE — AB

## 2015-10-22 LAB — COMPREHENSIVE METABOLIC PANEL
ALT: 7 U/L — ABNORMAL LOW (ref 14–54)
AST: 12 U/L — AB (ref 15–41)
Albumin: 2.3 g/dL — ABNORMAL LOW (ref 3.5–5.0)
Alkaline Phosphatase: 76 U/L (ref 38–126)
Anion gap: 18 — ABNORMAL HIGH (ref 5–15)
BILIRUBIN TOTAL: 0.4 mg/dL (ref 0.3–1.2)
BUN: 35 mg/dL — AB (ref 6–20)
CO2: 28 mmol/L (ref 22–32)
Calcium: 8.5 mg/dL — ABNORMAL LOW (ref 8.9–10.3)
Chloride: 94 mmol/L — ABNORMAL LOW (ref 101–111)
Creatinine, Ser: 6.63 mg/dL — ABNORMAL HIGH (ref 0.44–1.00)
GFR, EST AFRICAN AMERICAN: 7 mL/min — AB (ref >60)
GFR, EST NON AFRICAN AMERICAN: 6 mL/min — AB (ref >60)
Glucose, Bld: 105 mg/dL — ABNORMAL HIGH (ref 65–99)
POTASSIUM: 3.9 mmol/L (ref 3.5–5.1)
Sodium: 140 mmol/L (ref 135–145)
TOTAL PROTEIN: 6.2 g/dL — AB (ref 6.5–8.1)

## 2015-10-22 LAB — HEMOGLOBIN AND HEMATOCRIT, BLOOD
HCT: 29.1 % — ABNORMAL LOW (ref 36.0–46.0)
Hemoglobin: 9.9 g/dL — ABNORMAL LOW (ref 12.0–15.0)

## 2015-10-22 LAB — PREPARE RBC (CROSSMATCH)

## 2015-10-22 LAB — GLUCOSE, CAPILLARY
GLUCOSE-CAPILLARY: 104 mg/dL — AB (ref 65–99)
GLUCOSE-CAPILLARY: 110 mg/dL — AB (ref 65–99)
Glucose-Capillary: 100 mg/dL — ABNORMAL HIGH (ref 65–99)
Glucose-Capillary: 82 mg/dL (ref 65–99)
Glucose-Capillary: 73 mg/dL (ref 65–99)
Glucose-Capillary: 94 mg/dL (ref 65–99)

## 2015-10-22 LAB — MRSA PCR SCREENING: MRSA by PCR: NEGATIVE

## 2015-10-22 MED ORDER — PEG-KCL-NACL-NASULF-NA ASC-C 100 G PO SOLR
0.5000 | Freq: Once | ORAL | Status: AC
  Administered 2015-10-23: 100 g via ORAL

## 2015-10-22 MED ORDER — PENTAFLUOROPROP-TETRAFLUOROETH EX AERO: 1.0000 "application " | INHALATION_SPRAY | CUTANEOUS | Status: DC | PRN

## 2015-10-22 MED ORDER — PEG-KCL-NACL-NASULF-NA ASC-C 100 G PO SOLR
0.5000 | Freq: Once | ORAL | Status: AC
  Administered 2015-10-22: 100 g via ORAL
  Filled 2015-10-22: qty 1

## 2015-10-22 MED ORDER — HEPARIN SODIUM (PORCINE) 1000 UNIT/ML DIALYSIS
1000.0000 [IU] | INTRAMUSCULAR | Status: DC | PRN
  Filled 2015-10-22: qty 1

## 2015-10-22 MED ORDER — SODIUM CHLORIDE 0.9 % IV SOLN
250.0000 mg | INTRAVENOUS | Status: DC
  Administered 2015-10-24 – 2015-10-29 (×3): 250 mg via INTRAVENOUS
  Filled 2015-10-22 (×6): qty 20

## 2015-10-22 MED ORDER — PEG-KCL-NACL-NASULF-NA ASC-C 100 G PO SOLR: 1.0000 | Freq: Once | ORAL | Status: DC

## 2015-10-22 MED ORDER — SODIUM CHLORIDE 0.9 % IV SOLN: 100.0000 mL | INTRAVENOUS | Status: DC | PRN

## 2015-10-22 MED ORDER — FAMOTIDINE 20 MG PO TABS
20.0000 mg | ORAL_TABLET | Freq: Every day | ORAL | Status: DC
  Administered 2015-10-23 – 2015-10-30 (×7): 20 mg via ORAL
  Filled 2015-10-22 (×7): qty 1

## 2015-10-22 MED ORDER — SODIUM CHLORIDE 0.9 % IV SOLN
Freq: Once | INTRAVENOUS | Status: AC
  Administered 2015-10-22: 12:00:00 via INTRAVENOUS

## 2015-10-22 MED ORDER — SODIUM CHLORIDE 0.9 % IV SOLN: Freq: Once | INTRAVENOUS | Status: DC

## 2015-10-22 MED ORDER — LIDOCAINE HCL (PF) 1 % IJ SOLN: 5.0000 mL | INTRAMUSCULAR | Status: DC | PRN

## 2015-10-22 MED ORDER — ACETAMINOPHEN 325 MG PO TABS
ORAL_TABLET | ORAL | Status: AC
  Administered 2015-10-22: 325 mg via ORAL
  Filled 2015-10-22: qty 1

## 2015-10-22 MED ORDER — ALTEPLASE 2 MG IJ SOLR
2.0000 mg | Freq: Once | INTRAMUSCULAR | Status: DC | PRN
  Filled 2015-10-22: qty 2

## 2015-10-22 MED ORDER — LIDOCAINE-PRILOCAINE 2.5-2.5 % EX CREA
1.0000 "application " | TOPICAL_CREAM | CUTANEOUS | Status: DC | PRN
  Filled 2015-10-22: qty 5

## 2015-10-22 NOTE — Consult Note (Addendum)
Renal Service Consult Note [place] 10/22/2015 Roney Jaffe D Requesting Physician:  Dr Tana Coast  Reason for Consult:  ESRD patient w GIB HPI: The patient is a 65 y.o. year-old with hx of DM, HNT, obestiy, depression and ESRD who recently started HD at [facility] in early Feb 2017.  She also recently in the last few weeks had surgery / LN dissection for "tongue cancer" and is having difficulty speaking post op still.  She says 1 LN was + the rest were negative. Not getting any chemo/ radiation yet per pt.    Patient presented with nausea and bloody stools started yesterday.  She passed maroon blood clots and some BRBPR.  In ED BP's were low 70's - 90's, heart rate 110-130. Hb was 10.2 on admission and has dropped to 7.2 this am.  Is getting one unit prbc now.  She denies any CP, SOB, abd pain.    Last tsat 12% and ferritin 283 w Hb 9.7 on 2/25 when OP labs were done.   She denies use of nsaid's or blood thinners.   Home meds > zyrtec, tums, D3, B12, cymbalta, flovent, lasix 80 bid, norco, Lantus 25 , synthroid, Lopressor 50x 2, zantac, vit C, KCl    ROS  denies CP  no joint pain   no HA  no blurry vision  no rash  no diarrhea  no nausea/ vomiting  no dysuria  no difficulty voiding  no change in urine color    Past Medical History  Past Medical History  Diagnosis Date  . Diabetes mellitus   . Hypertension   . Hypothyroidism   . Cataract     Left eye( Dr. Katy Fitch, patient uninsured so she can't  get  an intervention)  . Chronic kidney disease     nephrolithiasis, s/p removal 12/05/07  . Back pain     myofascial, excacerberated in 6-7/09, 9/09- required narcotics at those times  . Elbow fracture, right   . Obesity   . Depression     better on nortriptyline  . Hyperplastic colon polyp 2001    f/u colonoscopy 11/23/06 was normal( Dr. Carlean Purl), falls into normal risk screening( next colonoscopy due in 2018)  . Pap smear for cervical cancer screening      12/14/2005: normal, evidence of candida. 05/16/2007: normal, benign reperative changes.  . Anemia   . Sleep apnea   . Pneumonia     as a child  . GERD (gastroesophageal reflux disease)   . Arthritis   . 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 Nephrolithiasis 11/2007, 01/2012  . Cancer ([place]) 06/08/15 bx    SCC of left lateral tongue   . Acute pulmonary edema ([place]) 08/03/2015   Past Surgical History  Past Surgical History  Procedure Laterality Date  . Cataract extraction Left   . Lithotripsy  11/2007  . Dilation and curettage of uterus    . Av fistula placement Left 05/12/2015    Procedure: ARTERIOVENOUS (AV) FISTULA CREATION;  Surgeon: Serafina Mitchell, MD;  Location: [place];  Service: Vascular;  Laterality: Left;  . Av fistula placement  05/12/15    left fore arm   Family History  Family History  Problem Relation Age of Onset  . Diabetes Mother   . Heart disease Mother     before age 68  . Hypertension Mother    Social History  reports that she has never smoked. She has  never used smokeless tobacco. She reports that she does not drink alcohol or use illicit drugs. Allergies No Known Allergies Home medications Prior to Admission medications   Medication Sig Start Date End Date Taking? Authorizing Provider  calcium carbonate (TUMS - DOSED IN MG ELEMENTAL CALCIUM) 500 MG chewable tablet Chew 2 tablets by mouth daily as needed for indigestion or heartburn.   Yes Historical Provider, MD  cetirizine (ZYRTEC) 10 MG tablet Take 10 mg by mouth daily as needed for allergies.  04/02/15  Yes Historical Provider, MD  Cholecalciferol (VITAMIN D3) 2000 UNITS TABS Take 1 capsule by mouth daily.   Yes Historical Provider, MD  Cyanocobalamin (VITAMIN B-12) 1000 MCG SUBL Place 1 tablet under the tongue daily.   Yes Historical Provider, MD  DULoxetine (CYMBALTA) 30 MG capsule Take 30-60 mg by mouth See admin instructions. Pt takes 30mg  in am and 60mg  in pm   Yes Historical  Provider, MD  fluticasone (FLOVENT HFA) 44 MCG/ACT inhaler Inhale 1 puff into the lungs 2 (two) times daily.   Yes Historical Provider, MD  furosemide (LASIX) 80 MG tablet Take 1 tablet (80 mg total) by mouth 2 (two) times daily. Fluid retention 08/08/15  Yes Thurnell Lose, MD  HYDROcodone-acetaminophen (NORCO) 10-325 MG tablet Take 1 tablet by mouth every 6 (six) hours as needed. Patient taking differently: Take 1 tablet by mouth every 6 (six) hours as needed for moderate pain.  05/12/15  Yes Samantha J Rhyne, PA-C  insulin glargine (LANTUS) 100 UNIT/ML injection Inject 0.25 mLs (25 Units total) into the skin at bedtime. 08/08/15  Yes Thurnell Lose, MD  levothyroxine (SYNTHROID, LEVOTHROID) 150 MCG tablet Take 150 mcg by mouth daily.     Yes Historical Provider, MD  metoprolol (LOPRESSOR) 50 MG tablet Take 50 mg by mouth 2 (two) times daily.   Yes Historical Provider, MD  ranitidine (ZANTAC) 300 MG tablet Take 300 mg by mouth daily as needed for heartburn.   Yes Historical Provider, MD  vitamin C (ASCORBIC ACID) 500 MG tablet Take 500 mg by mouth daily.   Yes Historical Provider, MD  potassium chloride 20 MEQ TBCR Take 20 mEq by mouth 2 (two) times daily. Patient not taking: Reported on 10/21/2015 08/08/15   Thurnell Lose, MD   Liver Function Tests  Recent Labs Lab 10/21/15 1930 10/22/15 0243  AST 18 12*  ALT 8* 7*  ALKPHOS 85 76  BILITOT 0.7 0.4  PROT 7.1 6.2*  ALBUMIN 2.5* 2.3*   No results for input(s): LIPASE, AMYLASE in the last 168 hours. CBC  Recent Labs Lab 10/21/15 1930  10/21/15 2331 10/22/15 0243 10/22/15 0811  WBC 12.4*  --  11.5* 10.0 8.6  NEUTROABS 10.8*  --   --   --   --   HGB 10.2*  < > 7.8* 7.3* 7.2*  HCT 31.5*  < > 23.5* 22.6* 22.4*  MCV 94.6  --  94.4 95.0 94.9  PLT 323  --  339 294 284  < > = values in this interval not displayed. Basic Metabolic Panel  Recent Labs Lab 10/21/15 1930 10/21/15 1949 10/22/15 0243  NA 138 136 140  K 3.7 3.6  3.9  CL 94* 92* 94*  CO2 28  --  28  GLUCOSE 157* 153* 105*  BUN 35* 35* 35*  CREATININE 6.39* 5.90* 6.63*  CALCIUM 8.6*  --  8.5*    Filed Vitals:   10/22/15 1313 10/22/15 1330 10/22/15 1400 10/22/15 1410  BP: 139/72  120/68  140/124  Pulse: 76 78 83 80  Temp: 99 F (37.2 C) 98.4 F (36.9 C)  98.4 F (36.9 C)  TempSrc: Oral Oral  Oral  Resp: 16 22 13 15   Height:      Weight:      SpO2: 97% 100% 100% 100%   Exam Obese WF no distress, calm No rash, cyanosis or gangrene Sclera anicteric, throat clear No jvd Chest clear bilat RRR soft SEM no RG ABd obese soft ntnd no ascites or mass GU defer MS no joint effusion/ deform Ext no LE or UE edema L forearm AVF +bruit  Home meds > zyrtec, tums, D3, B12, cymbalta, flovent, lasix 80 bid, norco, Lantus 25 , synthroid, Lopressor 50x 2, zantac, vit C, KCl   Dialysis: [facility]   4h  104kg  400/1.5  2/2 bath  Heparin 5000 (holding for GIB)   Hect 2 ug > pth 251, Ca/P ok Venofer 50 / wk > tsat 12%/ ferr 283/ Hb 9.7 2/25  Assessment: 1 Gastrointestinal bleed- acute 2 ESRD HD TTS 3 Anemia of CKD/ ABL, low tsat last draw at center.  Will give IV Fe. ESA's are contraindicated in cancer patients unless anemia is from chemoRx and and the chemoRx is not curative Rx.   4 SCCa of tongue - sp surgery/ LN dissection recently 5 DM2 on insulin 6 HTN lopressor bid 7 Volume - no vol excess, 1kg + 8 Obesity 9 Hypothyroid  Plan - HD today, no heparin, gentle Rx w GIB. Transfuse 1 more unit prbc w HD today. . DC IVF for now, use bolus prn for low BP's. IV Fe.   [name] MD [employer] pager [phone]    cell [phone] 10/22/2015, 3:16 PM

## 2015-10-22 NOTE — Progress Notes (Signed)
[facility]                                                                              Patient Demographics  Tami Elliott, is a 65 y.o. female, DOB - 03/10/1951, [id number]  Admit date - 10/21/2015   Admitting Physician Rise Patience, MD  Outpatient Primary MD for the patient is Harvie Junior, MD  LOS - 1   Chief Complaint  Patient presents with  . GI Bleeding       Brief HPI   65 year old female with ESRD on HD, TTS, diabetes type 2,Presented with 2 episodes of GI bleeding, described as large clots, dark. No nausea, vomiting, abdominal pain or fevers.   Assessment & Plan    Principal Problem:   Acute GI bleeding, Acute blood loss anemia - Continue NPO, gentle hydration, IV PPI - Transfuse 1 unit packed RBCs - I called gastroenterology, [facility], prior colonoscopy in 2011 per patient was normal  Active Problems: ESRD on hemodialysis: TTS - I called nephrology, patient will need HD    Hypothyroidism - Continue Synthroid    Type 2 diabetes mellitus with diabetic neuropathy, with long-term current use of insulin ([place]) - Continue sliding scale insulin, q4hrs, NPO - Lantus reduced to 10 units due to NPO status    Tongue cancer ([place]) status post recent surgery  Essential hypertension - BP soft, hold metoprolol, lasix  Code Status: full   Family Communication: Discussed in detail with the patient, all imaging results, lab results explained to the patient    Disposition Plan: monitor in SDU  Time Spent in minutes 25 minutes  Procedures  None   Consults   GI Renal   DVT Prophylaxis   SCD's  Medications  Scheduled Meds: . budesonide  0.25 mg Inhalation BID  . DULoxetine  30 mg Oral Daily   And  . DULoxetine  60 mg Oral QHS  . famotidine  20 mg Oral BID  . insulin aspart  0-9 Units Subcutaneous Q4H  . insulin glargine  10 Units Subcutaneous QHS  . levothyroxine  150 mcg Oral QAC breakfast  . pantoprazole (PROTONIX)  IV  40 mg Intravenous Q12H   Continuous Infusions: . sodium chloride 75 mL/hr at 10/22/15 0325   PRN Meds:.acetaminophen **OR** acetaminophen, HYDROcodone-acetaminophen, ondansetron **OR** ondansetron (ZOFRAN) IV   Antibiotics   Anti-infectives    None        Subjective:   Tami Elliott was seen and examined today.  Patient denies dizziness, chest pain, shortness of breath, N/V, new weakness, numbess, tingling. Mild lower quadrant abdominal discomfort, no bleeding this morning    Objective:   Filed Vitals:   10/21/15 2322 10/22/15 0300 10/22/15 0334 10/22/15 0800  BP: 129/69 101/67  115/56  Pulse:    79  Temp: 98.5 F (36.9 C) 98.9 F (37.2 C)  98.4 F (36.9 C)  TempSrc: Oral Oral  Oral  Resp: 14 17  16   Height: 5\' 4"  (1.626 m)     Weight: 102.967 kg (227 lb)  103.103 kg (227 lb 4.8 oz)   SpO2: 100% 100%  100%    Intake/Output Summary (Last  24 hours) at 10/22/15 1020 Last data filed at 10/22/15 0600  Gross per 24 hour  Intake 293.75 ml  Output      0 ml  Net 293.75 ml     Wt Readings from Last 3 Encounters:  10/22/15 103.103 kg (227 lb 4.8 oz)  08/08/15 113.4 kg (250 lb)  06/29/15 116.121 kg (256 lb)     Exam  General: Alert and oriented x 3, NAD  HEENT:  PERRLA, EOMI  Neck: Supple, no JVD  CVS: S1 S2 auscultated, no rubs, murmurs or gallops. Regular rate and rhythm.  Respiratory: Clear to auscultation bilaterally, no wheezing, rales or rhonchi  Abdomen: Soft, No significant tenderness , nondistended, + bowel sounds  Ext: no cyanosis clubbing or edema  Neuro: AAOx3, Cr N's II- XII. Strength 5/5 upper and lower extremities bilaterally  Skin: No rashes  Psych: Normal affect and demeanor, alert and oriented x3    Data Review   Micro Results Recent Results (from the past 240 hour(s))  MRSA PCR Screening     Status: None   Collection Time: 10/22/15  1:08 AM  Result Value Ref Range Status   MRSA by PCR NEGATIVE NEGATIVE Final     Comment:        The GeneXpert MRSA Assay (FDA approved for NASAL specimens only), is one component of a comprehensive MRSA colonization surveillance program. It is not intended to diagnose MRSA infection nor to guide or monitor treatment for MRSA infections.     Radiology Reports No results found.  CBC  Recent Labs Lab 10/21/15 1930 10/21/15 1949 10/21/15 2331 10/22/15 0243 10/22/15 0811  WBC 12.4*  --  11.5* 10.0 8.6  HGB 10.2* 8.8* 7.8* 7.3* 7.2*  HCT 31.5* 26.0* 23.5* 22.6* 22.4*  PLT 323  --  339 294 284  MCV 94.6  --  94.4 95.0 94.9  MCH 30.6  --  31.3 30.7 30.5  MCHC 32.4  --  33.2 32.3 32.1  RDW 17.7*  --  17.6* 17.8* 17.8*  LYMPHSABS 0.9  --   --   --   --   MONOABS 0.7  --   --   --   --   EOSABS 0.1  --   --   --   --   BASOSABS 0.1  --   --   --   --     Chemistries   Recent Labs Lab 10/21/15 1930 10/21/15 1949 10/22/15 0243  NA 138 136 140  K 3.7 3.6 3.9  CL 94* 92* 94*  CO2 28  --  28  GLUCOSE 157* 153* 105*  BUN 35* 35* 35*  CREATININE 6.39* 5.90* 6.63*  CALCIUM 8.6*  --  8.5*  AST 18  --  12*  ALT 8*  --  7*  ALKPHOS 85  --  76  BILITOT 0.7  --  0.4   ------------------------------------------------------------------------------------------------------------------ estimated creatinine clearance is 10.2 mL/min (by C-G formula based on Cr of 6.63). ------------------------------------------------------------------------------------------------------------------ No results for input(s): HGBA1C in the last 72 hours. ------------------------------------------------------------------------------------------------------------------ No results for input(s): CHOL, HDL, LDLCALC, TRIG, CHOLHDL, LDLDIRECT in the last 72 hours. ------------------------------------------------------------------------------------------------------------------ No results for input(s): TSH, T4TOTAL, T3FREE, THYROIDAB in the last 72 hours.  Invalid input(s):  FREET3 ------------------------------------------------------------------------------------------------------------------ No results for input(s): VITAMINB12, FOLATE, FERRITIN, TIBC, IRON, RETICCTPCT in the last 72 hours.  Coagulation profile  Recent Labs Lab 10/21/15 1930  INR 1.34    No results for input(s): DDIMER in the last 72 hours.  Cardiac  Enzymes No results for input(s): CKMB, TROPONINI, MYOGLOBIN in the last 168 hours.  Invalid input(s): CK ------------------------------------------------------------------------------------------------------------------ Invalid input(s): [place]  10/22/15 0013 10/22/15 0334 10/22/15 0738  GLUCAP 110* 100* 7     RAI,RIPUDEEP M.D. [facility] 10/22/2015, 10:20 AM  Pager: [phone] Between 7am to 7pm - call Pager - [phone]  After 7pm go to [email or web address] - password [email or web address]  Call night coverage person covering after 7pm

## 2015-10-22 NOTE — Consult Note (Signed)
Referring Provider: Dr. Tana Coast Primary Care Physician:  Harvie Junior, MD Primary Gastroenterologist:  Dr. Carlean Purl  Reason for Consultation:  GI bleeding  HPI: Tami Elliott is a 65 y.o. female with history of ESRD on hemodialysis TTS (just started beginning of February), diabetes mellitus type 2, tongue cancer who has had recent surgery at [facility] and had staples removed last week.  She presented to the ER at [facility] hospital because of rectal bleeding.  Patient had 2 episodes of rectal bleeding last evening. Patient states she passed large clots that were very deep red in color (not melena); other blood was brighter red. Both episodes where one hour apart. When EMS arrived patient was mildly hypotensive for which patient was given a fluid bolus.  She has not had any further episodes since admission.  Patient states she has had some hemorrhoidal issues (irritation, not bleeding) for which she was using suppositories.  Denies any associated nausea, vomiting, abdominal pain, etc.  Patient only takes aspirin 81 mg daily but no other BT's. Denies taking any NSAIDs.  Hgb was 10.2 grams on admission yesterday and is now 7.2 grams this AM so she is going to be transfused.    Last colonoscopy with Dr. Carlean Purl 12/2006 with external hemorrhoids only.  Past Medical History  Diagnosis Date  . Diabetes mellitus   . Hypertension   . Hypothyroidism   . Cataract     Left eye( Dr. Katy Fitch, patient uninsured so she can't  get  an intervention)  . Chronic kidney disease     nephrolithiasis, s/p removal 12/05/07  . Back pain     myofascial, excacerberated in 6-7/09, 9/09- required narcotics at those times  . Elbow fracture, right   . Obesity   . Depression     better on nortriptyline  . Hyperplastic colon polyp 2001    f/u colonoscopy 11/23/06 was normal( Dr. Carlean Purl), falls into normal risk screening( next colonoscopy due in 2018)  . Pap smear for cervical cancer screening     12/14/2005: normal,  evidence of candida. 05/16/2007: normal, benign reperative changes.  . Anemia   . Sleep apnea   . Pneumonia     as a child  . GERD (gastroesophageal reflux disease)   . Arthritis   . 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 Nephrolithiasis 11/2007, 01/2012  . Cancer ([place]) 06/08/15 bx    SCC of left lateral tongue   . Acute pulmonary edema ([place]) 08/03/2015    Past Surgical History  Procedure Laterality Date  . Cataract extraction Left   . Lithotripsy  11/2007  . Dilation and curettage of uterus    . Av fistula placement Left 05/12/2015    Procedure: ARTERIOVENOUS (AV) FISTULA CREATION;  Surgeon: Serafina Mitchell, MD;  Location: [facility];  Service: Vascular;  Laterality: Left;  . Av fistula placement  05/12/15    left fore arm    Prior to Admission medications   Medication Sig Start Date End Date Taking? Authorizing Provider  calcium carbonate (TUMS - DOSED IN MG ELEMENTAL CALCIUM) 500 MG chewable tablet Chew 2 tablets by mouth daily as needed for indigestion or heartburn.   Yes Historical Provider, MD  cetirizine (ZYRTEC) 10 MG tablet Take 10 mg by mouth daily as needed for allergies.  04/02/15  Yes Historical Provider, MD  Cholecalciferol (VITAMIN D3) 2000 UNITS TABS Take 1 capsule by mouth daily.   Yes Historical Provider, MD  Cyanocobalamin (VITAMIN  B-12) 1000 MCG SUBL Place 1 tablet under the tongue daily.   Yes Historical Provider, MD  DULoxetine (CYMBALTA) 30 MG capsule Take 30-60 mg by mouth See admin instructions. Pt takes 30mg  in am and 60mg  in pm   Yes Historical Provider, MD  fluticasone (FLOVENT HFA) 44 MCG/ACT inhaler Inhale 1 puff into the lungs 2 (two) times daily.   Yes Historical Provider, MD  furosemide (LASIX) 80 MG tablet Take 1 tablet (80 mg total) by mouth 2 (two) times daily. Fluid retention 08/08/15  Yes Thurnell Lose, MD  HYDROcodone-acetaminophen (NORCO) 10-325 MG tablet Take 1 tablet by mouth every 6 (six) hours as needed. Patient taking  differently: Take 1 tablet by mouth every 6 (six) hours as needed for moderate pain.  05/12/15  Yes Samantha J Rhyne, PA-C  insulin glargine (LANTUS) 100 UNIT/ML injection Inject 0.25 mLs (25 Units total) into the skin at bedtime. 08/08/15  Yes Thurnell Lose, MD  levothyroxine (SYNTHROID, LEVOTHROID) 150 MCG tablet Take 150 mcg by mouth daily.     Yes Historical Provider, MD  metoprolol (LOPRESSOR) 50 MG tablet Take 50 mg by mouth 2 (two) times daily.   Yes Historical Provider, MD  ranitidine (ZANTAC) 300 MG tablet Take 300 mg by mouth daily as needed for heartburn.   Yes Historical Provider, MD  vitamin C (ASCORBIC ACID) 500 MG tablet Take 500 mg by mouth daily.   Yes Historical Provider, MD  potassium chloride 20 MEQ TBCR Take 20 mEq by mouth 2 (two) times daily. Patient not taking: Reported on 10/21/2015 08/08/15   Thurnell Lose, MD    Current Facility-Administered Medications  Medication Dose Route Frequency Provider Last Rate Last Dose  . 0.9 %  sodium chloride infusion   Intravenous Continuous Rise Patience, MD 75 mL/hr at 10/22/15 0325    . acetaminophen (TYLENOL) tablet 650 mg  650 mg Oral Q6H PRN Rise Patience, MD       Or  . acetaminophen (TYLENOL) suppository 650 mg  650 mg Rectal Q6H PRN Rise Patience, MD      . budesonide (PULMICORT) nebulizer solution 0.25 mg  0.25 mg Inhalation BID Rise Patience, MD   0.25 mg at 10/22/15 0005  . DULoxetine (CYMBALTA) DR capsule 30 mg  30 mg Oral Daily Rise Patience, MD       And  . DULoxetine (CYMBALTA) DR capsule 60 mg  60 mg Oral QHS Rise Patience, MD   60 mg at 10/22/15 0100  . famotidine (PEPCID) tablet 20 mg  20 mg Oral BID Rise Patience, MD   20 mg at 10/22/15 0100  . HYDROcodone-acetaminophen (NORCO) 10-325 MG per tablet 1 tablet  1 tablet Oral Q6H PRN Rise Patience, MD      . insulin aspart (novoLOG) injection 0-9 Units  0-9 Units Subcutaneous Q4H Rise Patience, MD   0 Units at  10/21/15 2300  . insulin glargine (LANTUS) injection 10 Units  10 Units Subcutaneous QHS Rise Patience, MD   10 Units at 10/22/15 0100  . levothyroxine (SYNTHROID, LEVOTHROID) tablet 150 mcg  150 mcg Oral QAC breakfast Rise Patience, MD   150 mcg at 10/22/15 0725  . ondansetron (ZOFRAN) tablet 4 mg  4 mg Oral Q6H PRN Rise Patience, MD       Or  . ondansetron [facility]) injection 4 mg  4 mg Intravenous Q6H PRN Rise Patience, MD      .  pantoprazole (PROTONIX) injection 40 mg  40 mg Intravenous Q12H Rise Patience, MD   40 mg at 10/22/15 0100    Allergies as of 10/21/2015  . (No Known Allergies)    Family History  Problem Relation Age of Onset  . Diabetes Mother   . Heart disease Mother     before age 40  . Hypertension Mother     Social History   Social History  . Marital Status: Married    Spouse Name: N/A  . Number of Children: 2  . Years of Education: N/A   Occupational History  . Not on file.   Social History Main Topics  . Smoking status: Never Smoker   . Smokeless tobacco: Never Used  . Alcohol Use: No  . Drug Use: No  . Sexual Activity: Not on file   Other Topics Concern  . Not on file   Social History Narrative   Married( 2nd marriage). Husband awaiting back surgery. BE AWARE THAT HUSBAND BROKE PAIN CONTRACT WITH [facility] SEVERAL TIMES!   Patient unemployed. Difficulty finding a jobb/c she is partially blind( but not blind enough to have disability). She used to work in [employer].. The grandson's mother basically   Son 16 y/o , unemployed and grandson lives with her. The grandson's mother basically dropped the child off when he was 21 y/o. They rarely hear from her They moved to an apartment from their house in 12/2008.    Review of Systems: Ten point ROS is O/W negative except as mentioned in HPI.  Physical Exam: Vital signs in last 24 hours: Temp:  [98.4 F (36.9 C)-98.9 F (37.2 C)] 98.4 F (36.9 C) (03/09  0800) Pulse Rate:  [79-103] 79 (03/09 0800) Resp:  [14-20] 16 (03/09 0800) BP: (101-164)/(56-87) 115/56 mmHg (03/09 0800) SpO2:  [93 %-100 %] 100 % (03/09 0800) Weight:  [227 lb (102.967 kg)-227 lb 4.8 oz (103.103 kg)] 227 lb 4.8 oz (103.103 kg) (03/09 0334) Last BM Date: 10/21/15 General:  Alert, Well-developed, well-nourished, pleasant and cooperative in NAD Head:  Normocephalic and atraumatic. Eyes:  Sclera clear, no icterus.  Conjunctiva pink. Ears:  Normal auditory acuity. Neck:  Recent incision on left side of neck.   Lungs:  Clear throughout to auscultation.  No wheezes, crackles, or rhonchi.  Heart:  Regular rate and rhythm; no murmurs, clicks, rubs, or gallops. Abdomen:  Soft, non-distended.  BS present.  Non-tender.   Rectal:  Deferred  Msk:  Symmetrical without gross deformities. Pulses:  Normal pulses noted. Extremities:  Without clubbing or edema. Neurologic:  Alert and oriented x 4;  grossly normal neurologically. Skin:  Intact without significant lesions or rashes. Psych:  Alert and cooperative. Normal mood and affect.  Intake/Output from previous day: 03/08 0701 - 03/09 0700 In: 293.8 [I.V.:293.8] Out: -   Lab Results:  Recent Labs  10/21/15 2331 10/22/15 0243 10/22/15 0811  WBC 11.5* 10.0 8.6  HGB 7.8* 7.3* 7.2*  HCT 23.5* 22.6* 22.4*  PLT 339 294 284   BMET  Recent Labs  10/21/15 1930 10/21/15 1949 10/22/15 0243  NA 138 136 140  K 3.7 3.6 3.9  CL 94* 92* 94*  CO2 28  --  28  GLUCOSE 157* 153* 105*  BUN 35* 35* 35*  CREATININE 6.39* 5.90* 6.63*  CALCIUM 8.6*  --  8.5*   LFT  Recent Labs  10/22/15 0243  PROT 6.2*  ALBUMIN 2.3*  AST 12*  ALT 7*  ALKPHOS 76  BILITOT  0.4   PT/INR  Recent Labs  10/21/15 1930  LABPROT 16.7*  INR 1.34   IMPRESSION:  -Lower GI bleeding:  Could be diverticular but no evidence of that on last colonoscopy in 2008 (could have developed this though).  Doubt ischemia since it was painless.  Seems to be  resolving at this point. -Acute blood loss anemia:  Hgb down 3 grams since admission yesterday (7.2 grams this AM).  Going to be transfused.  PLAN: -Colonoscopy tentatively 1400 on 3/10.  If re-bleeds briskly in the interim then may need nuc med bleeding scan.   -Agree with PRBC's and monitor Hgb.  ZEHR, JESSICA D.  10/22/2015, 10:23 AM  Pager number [phone]       [facility] Attending   I have taken an interval history, reviewed the chart and examined the patient. I agree with the Advanced Practitioner's note, impression and recommendations.     Clinical scenario suggestive of diverticular hemorhage. Colonoscopy tomorrow. The risks and benefits as well as alternatives of endoscopic procedure(s) have been discussed and reviewed. All questions answered. The patient agrees to proceed.  Gatha Mayer, MD, [facility] [phone] (pager) [phone] after 5 PM, weekends and holidays  10/22/2015 7:07 PM

## 2015-10-23 ENCOUNTER — Encounter (HOSPITAL_COMMUNITY): Payer: Self-pay

## 2015-10-23 ENCOUNTER — Encounter (HOSPITAL_COMMUNITY)
Admission: EM | Disposition: A | Discharge status: Home / Self Care | Payer: Self-pay | Source: Home / Self Care | Attending: Family Medicine

## 2015-10-23 DIAGNOSIS — K626 Ulcer of anus and rectum: Secondary | ICD-10-CM

## 2015-10-23 DIAGNOSIS — K649 Unspecified hemorrhoids: Secondary | ICD-10-CM

## 2015-10-23 HISTORY — DX: Ulcer of anus and rectum: K62.6

## 2015-10-23 HISTORY — PX: FLEXIBLE SIGMOIDOSCOPY: SHX5431

## 2015-10-23 LAB — CBC
HCT: 25 % — ABNORMAL LOW (ref 36.0–46.0)
HEMATOCRIT: 20.8 % — AB (ref 36.0–46.0)
HEMOGLOBIN: 8.4 g/dL — AB (ref 12.0–15.0)
Hemoglobin: 6.7 g/dL — CL (ref 12.0–15.0)
MCH: 29.7 pg (ref 26.0–34.0)
MCH: 28.8 pg (ref 26.0–34.0)
MCHC: 33.6 g/dL (ref 30.0–36.0)
MCHC: 32.2 g/dL (ref 30.0–36.0)
MCV: 88.3 fL (ref 78.0–100.0)
MCV: 89.3 fL (ref 78.0–100.0)
Platelets: 279 10*3/uL (ref 150–400)
Platelets: 209 10*3/uL (ref 150–400)
RBC: 2.83 MIL/uL — AB (ref 3.87–5.11)
RBC: 2.33 MIL/uL — AB (ref 3.87–5.11)
RDW: 19.6 % — ABNORMAL HIGH (ref 11.5–15.5)
RDW: 19.7 % — ABNORMAL HIGH (ref 11.5–15.5)
WBC: 18.2 10*3/uL — ABNORMAL HIGH (ref 4.0–10.5)
WBC: 10 10*3/uL (ref 4.0–10.5)

## 2015-10-23 LAB — GLUCOSE, CAPILLARY
GLUCOSE-CAPILLARY: 96 mg/dL (ref 65–99)
GLUCOSE-CAPILLARY: 73 mg/dL (ref 65–99)
GLUCOSE-CAPILLARY: 74 mg/dL (ref 65–99)
Glucose-Capillary: 71 mg/dL (ref 65–99)
Glucose-Capillary: 83 mg/dL (ref 65–99)
Glucose-Capillary: 98 mg/dL (ref 65–99)

## 2015-10-23 LAB — PREPARE RBC (CROSSMATCH)

## 2015-10-23 SURGERY — COLONOSCOPY
Anesthesia: Moderate Sedation

## 2015-10-23 SURGERY — SIGMOIDOSCOPY, FLEXIBLE
Anesthesia: Moderate Sedation

## 2015-10-23 MED ORDER — SODIUM CHLORIDE 0.9 % IV SOLN: INTRAVENOUS | Status: DC

## 2015-10-23 MED ORDER — MIDAZOLAM HCL 5 MG/5ML IJ SOLN
INTRAMUSCULAR | Status: DC | PRN
  Administered 2015-10-23: 2 mg via INTRAVENOUS

## 2015-10-23 MED ORDER — MIDAZOLAM HCL 5 MG/ML IJ SOLN
INTRAMUSCULAR | Status: AC
  Filled 2015-10-23: qty 2

## 2015-10-23 MED ORDER — FENTANYL CITRATE (PF) 100 MCG/2ML IJ SOLN
INTRAMUSCULAR | Status: AC
  Filled 2015-10-23: qty 2

## 2015-10-23 MED ORDER — SODIUM CHLORIDE 0.9 % IV SOLN
Freq: Once | INTRAVENOUS | Status: AC
  Administered 2015-10-24: 11:00:00 via INTRAVENOUS

## 2015-10-23 MED ORDER — POLYETHYLENE GLYCOL 3350 17 G PO PACK
17.0000 g | PACK | Freq: Two times a day (BID) | ORAL | Status: DC
  Administered 2015-10-25 – 2015-10-30 (×5): 17 g via ORAL
  Filled 2015-10-23 (×7): qty 1

## 2015-10-23 MED ORDER — FENTANYL CITRATE (PF) 100 MCG/2ML IJ SOLN
INTRAMUSCULAR | Status: DC | PRN
  Administered 2015-10-23: 25 ug via INTRAVENOUS

## 2015-10-23 MED ORDER — DIPHENHYDRAMINE HCL 50 MG/ML IJ SOLN
INTRAMUSCULAR | Status: AC
Start: 2015-10-23 — End: 2015-10-23
  Filled 2015-10-23: qty 1

## 2015-10-23 NOTE — Progress Notes (Signed)
Patient's hemoglobin is 6.7.  Have notified [place] [facility] of primary team.  Will continue to monitor patient.

## 2015-10-23 NOTE — Care Management Important Message (Signed)
Important Message  Patient Details  Name: Tami Elliott MRN: [id number] Date of Birth: 1951-04-25   Medicare Important Message Given:  Yes    Nathen May 10/23/2015, 11:55 AM

## 2015-10-23 NOTE — Progress Notes (Signed)
10/23/2015 Patient receive tap water enema 500cc at 1558. [facility] RN.

## 2015-10-23 NOTE — Op Note (Signed)
[facility] Patient Name: Tami Elliott Procedure Date : 10/23/2015 MRN: [id number] Attending MD: Gatha Mayer , MD Date of Birth: September 24, 1950 CSN: [id number] Age: 65 Admit Type: Inpatient Procedure:                Flexible Sigmoidoscopy Indications:              Hematochezia Providers:                Gatha Mayer, MD, Laverta Baltimore, RN,                            William Dalton, Technician Referring MD:              Medicines:                Fentanyl 25 micrograms IV, Midazolam 2 mg IV, None Complications:            No immediate complications. Estimated Blood Loss:     Estimated blood loss: none. Procedure:                Pre-Anesthesia Assessment:                           - Prior to the procedure, a History and Physical                            was performed, and patient medications and                            allergies were reviewed. The patient's tolerance of                            previous anesthesia was also reviewed. The risks                            and benefits of the procedure and the sedation                            options and risks were discussed with the patient.                            All questions were answered, and informed consent                            was obtained. Prior Anticoagulants: The patient                            last took aspirin on the day of the procedure. ASA                            Grade Assessment: III - A patient with severe                            systemic disease. After reviewing the risks and  benefits, the patient was deemed in satisfactory                            condition to undergo the procedure.                           After obtaining informed consent, the scope was                            passed under direct vision. The [id number]                            scope was introduced through the anus and advanced                            to  the the sigmoid colon. After obtaining informed                            consent, the scope was passed under direct vision.                            The flexible sigmoidoscopy was somewhat difficult                            due to poor bowel prep. Scope In: 4:50:33 PM Scope Out: 5:01:46 PM Total Procedure Duration: 0 hours 11 minutes 12 seconds  Findings:      The perianal exam findings include thrombosed external hemorrhoids.      tender DRE.      Multiple 5-10 mm ulcers were found at the anus and in the distal rectum.       No bleeding was present. No stigmata of recent bleeding were seen.       Biopsies were taken with a cold forceps for histology.      External and internal hemorrhoids were found during retroflexion. The       hemorrhoids were medium-sized.      Anal papilla(e) were hypertrophied. Impression:               - Thrombosed external hemorrhoids found on perianal                            exam.                           - Multiple ulcers at the anus and in the distal                            rectum. Biopsied.                           - External and internal hemorrhoids.                           - Anal papilla(e) were hypertrophied. Moderate Sedation:      Moderate (conscious) sedation was administered by the endoscopy nurse       and supervised  by the endoscopist. The following parameters were       monitored: oxygen saturation, heart rate, blood pressure, respiratory       rate, EKG, adequacy of pulmonary ventilation, and response to care.       Total physician intraservice time was 6 minutes. Recommendation:           - Resume previous diet.                           - Miralax 1 capful (17 grams) in 8 ounces of water                            PO BID.                           - Await pathology results.                           - She will likely bleed some from the biopsies. ?                            STERCORAL ULCERATIONS VS NEOPLASTIC. NOT CLEAR TO                             ME. LIMITED EXAM OVERALL BUT DID NOT SEE ANY                            DIVERTICULA. BELIEVE BASED UPON NO UPSTREAM BLOOED                            AND THESE FINDINGS THAT THE SOURCE OF BLEEDING WAS                            THE ULCERS AND OVERALL SUSPECT STERCORAL ULCERS AND                            HEMORRHOIDS AS SOURCE. Procedure Code(s):        --- Professional ---                           [phone], Sigmoidoscopy, flexible; with biopsy, single                            or multiple Diagnosis Code(s):        --- Professional ---                           K64.8, Other hemorrhoids                           K62.6, Ulcer of anus and rectum                           K62.89, Other specified diseases of anus and rectum  K92.1, Melena (includes Hematochezia) CPT copyright 2016 American Medical Association. All rights reserved. The codes documented in this report are preliminary and upon coder review may  be revised to meet current compliance requirements. Gatha Mayer, MD Gatha Mayer, MD 10/23/2015 5:21:38 PM Number of Addenda: 0

## 2015-10-23 NOTE — Progress Notes (Signed)
Patient for colonoscopy this afternoon.  Was not able to drink about 1/4 of the morning prep dose, but last stool was watery and very light brown.  Had some bleeding earlier with the prep but none with at least the past one BM.  Had a rough time because she got dialysis last night and then had to do the prep.  AM Hgb pending.

## 2015-10-23 NOTE — Progress Notes (Signed)
  [facility] Progress Note   Subjective: stable  Filed Vitals:   10/23/15 0010 10/23/15 0445 10/23/15 0738 10/23/15 1159  BP: 132/80 141/79 120/100 97/70  Pulse: 91  114 98  Temp: 98.8 F (37.1 C) 99.2 F (37.3 C) 98.6 F (37 C) 99 F (37.2 C)  TempSrc: Oral Oral Oral Oral  Resp: 14 13 12 12   Height:      Weight:      SpO2: 94% 92% 100% 100%    Inpatient medications: . sodium chloride   Intravenous Once  . budesonide  0.25 mg Inhalation BID  . DULoxetine  30 mg Oral Daily   And  . DULoxetine  60 mg Oral QHS  . famotidine  20 mg Oral Daily  . [START ON 10/24/2015] ferric gluconate (FERRLECIT/NULECIT) IV  250 mg Intravenous Q T,Th,Sa-HD  . insulin aspart  0-9 Units Subcutaneous Q4H  . insulin glargine  10 Units Subcutaneous QHS  . levothyroxine  150 mcg Oral QAC breakfast  . pantoprazole (PROTONIX) IV  40 mg Intravenous Q12H   . sodium chloride 20 mL/hr at 10/23/15 0842   acetaminophen **OR** acetaminophen, HYDROcodone-acetaminophen, ondansetron **OR** ondansetron (ZOFRAN) IV  Exam: Obese WF no distress, calm No jvd Chest clear bilat RRR soft SEM no RG ABd obese soft ntnd no ascites or mass MS no joint effusion/ deform Ext no LE or UE edema L forearm AVF +bruit  Home meds > zyrtec, tums, D3, B12, cymbalta, flovent,  norco, Lantus 25 , synthroid, zantac, vit C, KCl  // Lasix 80 bid,Lopressor 50x 2  Dialysis: TTS NW 4h 104kg 400/1.5 2/2 bath Heparin 5000 (holding for GIB)  Hect 2 ug > pth 251, Ca/P ok Venofer 50 / wk > tsat 12%/ ferr 283/ Hb 9.7 2/25  Assessment: 1 Gastrointestinal bleed- acute, prob lower. Per GI/ primary 2 ESRD HD TTS 3 Anemia of CKD/ ABL -loading w IV Fe. ESA's contraindicated in cancer patients unless anemia is from chemo and and chemo is not curative  4 SCCa of tongue - sp surgery/ LN dissection recently 5 DM2 on insulin 6 HTN lopressor bid 7 Volume - no vol excess, 1kg +  Plan - HD Saturday, no heparin, min  UF   [name] MD [facility] pager [phone]    cell [phone] 10/23/2015, 12:31 PM    Recent Labs Lab 10/21/15 1930 10/21/15 1949 10/22/15 0243  NA 138 136 140  K 3.7 3.6 3.9  CL 94* 92* 94*  CO2 28  --  28  GLUCOSE 157* 153* 105*  BUN 35* 35* 35*  CREATININE 6.39* 5.90* 6.63*  CALCIUM 8.6*  --  8.5*    Recent Labs Lab 10/21/15 1930 10/22/15 0243  AST 18 12*  ALT 8* 7*  ALKPHOS 85 76  BILITOT 0.7 0.4  PROT 7.1 6.2*  ALBUMIN 2.5* 2.3*    Recent Labs Lab 10/21/15 1930  10/22/15 0243 10/22/15 0811 10/22/15 2209 10/23/15 0952  WBC 12.4*  < > 10.0 8.6  --  18.2*  NEUTROABS 10.8*  --   --   --   --   --   HGB 10.2*  < > 7.3* 7.2* 9.9* 8.4*  HCT 31.5*  < > 22.6* 22.4* 29.1* 25.0*  MCV 94.6  < > 95.0 94.9  --  88.3  PLT 323  < > 294 284  --  279  < > = values in this interval not displayed.

## 2015-10-23 NOTE — Progress Notes (Signed)
[facility]                                                                              Patient Demographics  Tami Elliott, is a 65 y.o. female, DOB - 1950-11-18, [id number]  Admit date - 10/21/2015   Admitting Physician Rise Patience, MD  Outpatient Primary MD for the patient is Harvie Junior, MD  LOS - 2   Chief Complaint  Patient presents with  . GI Bleeding       Brief HPI   65 year old female with ESRD on HD, TTS, diabetes type 2,Presented with 2 episodes of GI bleeding, described as large clots, dark. No nausea, vomiting, abdominal pain or fevers.   Assessment & Plan    Principal Problem:   Acute GI bleeding, Acute blood loss anemia - Continue NPO, gentle hydration, IV PPI - s/p Transfusion 1 unit packed RBCs - GI on board and plans are for colonoscopy or Nuc scan should patient rebleed. - From my end will continue to monitor cbc's serially  Active Problems: ESRD on hemodialysis: TTS - Nephrology managing.    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 sodium chloride   Intravenous Once  . budesonide  0.25 mg Inhalation BID  . DULoxetine  30 mg Oral Daily   And  . DULoxetine  60 mg Oral QHS  . famotidine  20 mg Oral Daily  . [START ON 10/24/2015] ferric gluconate (FERRLECIT/NULECIT) IV  250 mg Intravenous  Q T,Th,Sa-HD  . insulin aspart  0-9 Units Subcutaneous Q4H  . insulin glargine  10 Units Subcutaneous QHS  . levothyroxine  150 mcg Oral QAC breakfast  . pantoprazole (PROTONIX) IV  40 mg Intravenous Q12H   Continuous Infusions: . sodium chloride 20 mL/hr at 10/23/15 0842   PRN Meds:.acetaminophen **OR** acetaminophen, HYDROcodone-acetaminophen, ondansetron **OR** ondansetron (ZOFRAN) IV   Antibiotics   Anti-infectives    None        Subjective:   Tami Elliott Pt has no new complaints reported to me today.   Objective:   Filed Vitals:   10/22/15 2154 10/23/15 0010 10/23/15 0445 10/23/15 0738  BP: 133/78 132/80 141/79 120/100  Pulse:  91  114  Temp: 98.7 F (37.1 C) 98.8 F (37.1 C) 99.2 F (37.3 C) 98.6 F (37 C)  TempSrc: Oral Oral Oral Oral  Resp: 15 14 13 12   Height:      Weight:      SpO2: 95% 94% 92% 100%    Intake/Output Summary (  Last 24 hours) at 10/23/15 1129 Last data filed at 10/22/15 2115  Gross per 24 hour  Intake   1390 ml  Output    625 ml  Net    765 ml     Wt Readings from Last 3 Encounters:  10/22/15 105 kg (231 lb 7.7 oz)  08/08/15 113.4 kg (250 lb)  06/29/15 116.121 kg (256 lb)     Exam  General: Alert and oriented x 3, NAD  HEENT:  PERRLA, EOMI  Neck: Supple, no JVD  CVS: S1 S2 auscultated, no rubs, murmurs or gallops. Regular rate and rhythm.  Respiratory: Clear to auscultation bilaterally, no wheezing, rales or rhonchi  Abdomen: Soft, No significant tenderness , nondistended, + bowel sounds  Ext: no cyanosis clubbing or edema  Neuro: AAOx3, no facial asymmetry  Skin: No rashes  Psych: Normal affect and demeanor, alert and oriented x3    Data Review   Micro Results Recent Results (from the past 240 hour(s))  MRSA PCR Screening     Status: None   Collection Time: 10/22/15  1:08 AM  Result Value Ref Range Status   MRSA by PCR NEGATIVE NEGATIVE Final    Comment:        The GeneXpert MRSA Assay  (FDA approved for NASAL specimens only), is one component of a comprehensive MRSA colonization surveillance program. It is not intended to diagnose MRSA infection nor to guide or monitor treatment for MRSA infections.     Radiology Reports No results found.  CBC  Recent Labs Lab 10/21/15 1930  10/21/15 2331 10/22/15 0243 10/22/15 0811 10/22/15 2209 10/23/15 0952  WBC 12.4*  --  11.5* 10.0 8.6  --  18.2*  HGB 10.2*  < > 7.8* 7.3* 7.2* 9.9* 8.4*  HCT 31.5*  < > 23.5* 22.6* 22.4* 29.1* 25.0*  PLT 323  --  339 294 284  --  279  MCV 94.6  --  94.4 95.0 94.9  --  88.3  MCH 30.6  --  31.3 30.7 30.5  --  29.7  MCHC 32.4  --  33.2 32.3 32.1  --  33.6  RDW 17.7*  --  17.6* 17.8* 17.8*  --  19.6*  LYMPHSABS 0.9  --   --   --   --   --   --   MONOABS 0.7  --   --   --   --   --   --   EOSABS 0.1  --   --   --   --   --   --   BASOSABS 0.1  --   --   --   --   --   --   < > = values in this interval not displayed.  Chemistries   Recent Labs Lab 10/21/15 1930 10/21/15 1949 10/22/15 0243  NA 138 136 140  K 3.7 3.6 3.9  CL 94* 92* 94*  CO2 28  --  28  GLUCOSE 157* 153* 105*  BUN 35* 35* 35*  CREATININE 6.39* 5.90* 6.63*  CALCIUM 8.6*  --  8.5*  AST 18  --  12*  ALT 8*  --  7*  ALKPHOS 85  --  76  BILITOT 0.7  --  0.4   ------------------------------------------------------------------------------------------------------------------ estimated creatinine clearance is 10.3 mL/min (by C-G formula based on Cr of 6.63). ------------------------------------------------------------------------------------------------------------------ No results for input(s): HGBA1C in the last 72 hours. ------------------------------------------------------------------------------------------------------------------ No results for input(s): CHOL, HDL, LDLCALC, TRIG, CHOLHDL, LDLDIRECT in the last 72  hours. ------------------------------------------------------------------------------------------------------------------ No results for input(s): TSH, T4TOTAL, T3FREE, THYROIDAB in the last 72 hours.  Invalid input(s): FREET3 ------------------------------------------------------------------------------------------------------------------ No results for input(s): VITAMINB12, FOLATE, FERRITIN, TIBC, IRON, RETICCTPCT in the last 72 hours.  Coagulation profile  Recent Labs Lab 10/21/15 1930  INR 1.34    No results for input(s): DDIMER in the last 72 hours.  Cardiac Enzymes No results for input(s): CKMB, TROPONINI, MYOGLOBIN in the last 168 hours.  Invalid input(s): CK ------------------------------------------------------------------------------------------------------------------ Invalid input(s): [place]  10/22/15 0738 10/22/15 1326 10/22/15 1712 10/22/15 2156 10/23/15 0101 10/23/15 0446  GLUCAP 82 73 94 104* 96 73     Velvet Bathe M.D. [facility] 10/23/2015, 11:29 AM  Pager: [place]:[phone] Between 7am to 7pm - call Pager - [phone]  After 7pm go to [email or web address] - password [email or web address]  Call night coverage person covering after 7pm

## 2015-10-23 NOTE — Progress Notes (Signed)
CRITICAL VALUE ALERT  Critical value received:  Hemoglobin (6.7)  Date of notification:  09/25/2015  Time of notification:  [id number]  Critical value read back:Yes.    Nurse who received alert:  Rico Sheehan (RN)  MD notified (1st page):  Dr Wendee Beavers)   Time of first page:  Text at [place] and was called at [place]  MD notified (2nd page):  Time of second page:  Responding MD:  Dr Wendee Beavers   Time MD responded:  1925 Dr Wendee Beavers was made aware that third shift nurse notified the oncall MD tonight about the  Critical value

## 2015-10-23 NOTE — Progress Notes (Signed)
Utilization Review Completed.  

## 2015-10-24 ENCOUNTER — Encounter (HOSPITAL_COMMUNITY): Payer: Self-pay | Admitting: Internal Medicine

## 2015-10-24 LAB — CBC
HEMATOCRIT: 18.7 % — AB (ref 36.0–46.0)
HEMOGLOBIN: 6.1 g/dL — AB (ref 12.0–15.0)
MCH: 29.2 pg (ref 26.0–34.0)
MCHC: 32.6 g/dL (ref 30.0–36.0)
MCV: 89.5 fL (ref 78.0–100.0)
PLATELETS: 222 10*3/uL (ref 150–400)
RBC: 2.09 MIL/uL — AB (ref 3.87–5.11)
RDW: 19.4 % — ABNORMAL HIGH (ref 11.5–15.5)
WBC: 10.4 10*3/uL (ref 4.0–10.5)

## 2015-10-24 LAB — GLUCOSE, CAPILLARY
GLUCOSE-CAPILLARY: 113 mg/dL — AB (ref 65–99)
GLUCOSE-CAPILLARY: 47 mg/dL — AB (ref 65–99)
GLUCOSE-CAPILLARY: 70 mg/dL (ref 65–99)
GLUCOSE-CAPILLARY: 78 mg/dL (ref 65–99)
GLUCOSE-CAPILLARY: 89 mg/dL (ref 65–99)
GLUCOSE-CAPILLARY: 93 mg/dL (ref 65–99)

## 2015-10-24 MED ORDER — LIDOCAINE HCL (PF) 1 % IJ SOLN: 5.0000 mL | INTRAMUSCULAR | Status: DC | PRN

## 2015-10-24 MED ORDER — SODIUM CHLORIDE 0.9 % IV SOLN: 100.0000 mL | INTRAVENOUS | Status: DC | PRN

## 2015-10-24 MED ORDER — PENTAFLUOROPROP-TETRAFLUOROETH EX AERO: 1.0000 "application " | INHALATION_SPRAY | CUTANEOUS | Status: DC | PRN

## 2015-10-24 MED ORDER — LIDOCAINE-PRILOCAINE 2.5-2.5 % EX CREA: 1.0000 "application " | TOPICAL_CREAM | CUTANEOUS | Status: DC | PRN

## 2015-10-24 MED ORDER — ALTEPLASE 2 MG IJ SOLR: 2.0000 mg | Freq: Once | INTRAMUSCULAR | Status: DC | PRN

## 2015-10-24 MED ORDER — HEPARIN SODIUM (PORCINE) 1000 UNIT/ML DIALYSIS: 1000.0000 [IU] | INTRAMUSCULAR | Status: DC | PRN

## 2015-10-24 NOTE — Progress Notes (Signed)
[facility]                                                                              Patient Demographics  Tami Elliott, is a 65 y.o. female, DOB - 24-Jan-1951, [id number]  Admit date - 10/21/2015   Admitting Physician Rise Patience, MD  Outpatient Primary MD for the patient is Harvie Junior, MD  LOS - 3   Chief Complaint  Patient presents with  . GI Bleeding       Brief HPI   65 year old female with ESRD on HD, TTS, diabetes type 2,Presented with 2 episodes of GI bleeding, described as large clots, dark. No nausea, vomiting, abdominal pain or fevers.   Assessment & Plan    Principal Problem:   Acute GI bleeding, Acute blood loss anemia - Continue NPO, gentle hydration, IV PPI - s/p Transfusion 1 unit packed RBCs, Will obtain 2 more during Dialysis today. - GI on board and patient is status post flexible sigmoidoscopy. Blood noted upstream as such current thought is patient may have had bleeding from stercoral ulcers - From my end will continue to monitor cbc's serially  Active Problems: ESRD on hemodialysis: TTS - Nephrology managing.    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 sodium chloride   Intravenous Once  . budesonide  0.25 mg Inhalation BID  .  DULoxetine  30 mg Oral Daily   And  . DULoxetine  60 mg Oral QHS  . famotidine  20 mg Oral Daily  . ferric gluconate (FERRLECIT/NULECIT) IV  250 mg Intravenous Q T,Th,Sa-HD  . insulin aspart  0-9 Units Subcutaneous Q4H  . insulin glargine  10 Units Subcutaneous QHS  . levothyroxine  150 mcg Oral QAC breakfast  . pantoprazole (PROTONIX) IV  40 mg Intravenous Q12H  . polyethylene glycol  17 g Oral BID   Continuous Infusions:   PRN Meds:.acetaminophen **OR** acetaminophen, HYDROcodone-acetaminophen, ondansetron **OR** ondansetron (ZOFRAN) IV   Antibiotics   Anti-infectives    None        Subjective:   Tami Elliott Pt has no new complaints reported to me today.   Objective:   Filed Vitals:   10/23/15 1955 10/23/15 2340 10/24/15 0335 10/24/15 0740  BP: 107/64 101/50  132/74  Pulse:    77  Temp: 98.2 F (36.8 C) 98.6 F (37 C) 98.4 F (36.9 C) 98.4 F (36.9 C)  TempSrc: Oral Oral Oral Oral  Resp: 15 16  14  Height:      Weight:      SpO2: 98% 100%  97%    Intake/Output Summary (Last 24 hours) at 10/24/15 1123 Last data filed at 10/24/15 0956  Gross per 24 hour  Intake   1160 ml  Output      0 ml  Net   1160 ml     Wt Readings from Last 3 Encounters:  10/22/15 105 kg (231 lb 7.7 oz)  08/08/15 113.4 kg (250 lb)  06/29/15 116.121 kg (256 lb)     Exam  General: Alert and oriented x 3, NAD  HEENT:  PERRLA, EOMI  Neck: Supple, no JVD  CVS: S1 S2 auscultated, no rubs, murmurs or gallops. Regular rate and rhythm.  Respiratory: Clear to auscultation bilaterally, no wheezing, rales or rhonchi  Abdomen: Soft, No significant tenderness , nondistended, + bowel sounds  Ext: no cyanosis clubbing or edema  Neuro: AAOx3, no facial asymmetry  Skin: No rashes  Psych: Normal affect and demeanor   Data Review   Micro Results Recent Results (from the past 240 hour(s))  MRSA PCR Screening     Status: None   Collection Time: 10/22/15  1:08 AM  Result  Value Ref Range Status   MRSA by PCR NEGATIVE NEGATIVE Final    Comment:        The GeneXpert MRSA Assay (FDA approved for NASAL specimens only), is one component of a comprehensive MRSA colonization surveillance program. It is not intended to diagnose MRSA infection nor to guide or monitor treatment for MRSA infections.     Radiology Reports No results found.  CBC  Recent Labs Lab 10/21/15 1930  10/22/15 0243 10/22/15 0811 10/22/15 2209 10/23/15 0952 10/23/15 1819 10/24/15 0209  WBC 12.4*  < > 10.0 8.6  --  18.2* 10.0 10.4  HGB 10.2*  < > 7.3* 7.2* 9.9* 8.4* 6.7* 6.1*  HCT 31.5*  < > 22.6* 22.4* 29.1* 25.0* 20.8* 18.7*  PLT 323  < > 294 284  --  279 209 222  MCV 94.6  < > 95.0 94.9  --  88.3 89.3 89.5  MCH 30.6  < > 30.7 30.5  --  29.7 28.8 29.2  MCHC 32.4  < > 32.3 32.1  --  33.6 32.2 32.6  RDW 17.7*  < > 17.8* 17.8*  --  19.6* 19.7* 19.4*  LYMPHSABS 0.9  --   --   --   --   --   --   --   MONOABS 0.7  --   --   --   --   --   --   --   EOSABS 0.1  --   --   --   --   --   --   --   BASOSABS 0.1  --   --   --   --   --   --   --   < > = values in this interval not displayed.  Chemistries   Recent Labs Lab 10/21/15 1930 10/21/15 1949 10/22/15 0243  NA 138 136 140  K 3.7 3.6 3.9  CL 94* 92* 94*  CO2 28  --  28  GLUCOSE 157* 153* 105*  BUN 35* 35* 35*  CREATININE 6.39* 5.90* 6.63*  CALCIUM 8.6*  --  8.5*  AST 18  --  12*  ALT 8*  --  7*  ALKPHOS 85  --  76  BILITOT 0.7  --  0.4   ------------------------------------------------------------------------------------------------------------------  estimated creatinine clearance is 10.3 mL/min (by C-G formula based on Cr of 6.63). ------------------------------------------------------------------------------------------------------------------ No results for input(s): HGBA1C in the last 72  hours. ------------------------------------------------------------------------------------------------------------------ No results for input(s): CHOL, HDL, LDLCALC, TRIG, CHOLHDL, LDLDIRECT in the last 72 hours. ------------------------------------------------------------------------------------------------------------------ No results for input(s): TSH, T4TOTAL, T3FREE, THYROIDAB in the last 72 hours.  Invalid input(s): FREET3 ------------------------------------------------------------------------------------------------------------------ No results for input(s): VITAMINB12, FOLATE, FERRITIN, TIBC, IRON, RETICCTPCT in the last 72 hours.  Coagulation profile  Recent Labs Lab 10/21/15 1930  INR 1.34    No results for input(s): DDIMER in the last 72 hours.  Cardiac Enzymes No results for input(s): CKMB, TROPONINI, MYOGLOBIN in the last 168 hours.  Invalid input(s): CK ------------------------------------------------------------------------------------------------------------------ Invalid input(s): [place]  10/23/15 1558 10/23/15 2036 10/23/15 2344 10/24/15 0337 10/24/15 0819 10/24/15 0959  GLUCAP 74 98 93 89 47* 78     Velvet Bathe M.D. [facility] 10/24/2015, 11:23 AM  Pager: [place]:[phone] Between 7am to 7pm - call Pager - [phone]  After 7pm go to [email or web address] - password [email or web address]  Call night coverage person covering after 7pm

## 2015-10-24 NOTE — Progress Notes (Signed)
   10/24/15 1831  Clinical Encounter Type  Visited With Patient  Visit Type Follow-up;Spiritual support;Social support  Referral From Patient  Spiritual Encounters  Spiritual Needs Emotional   Chaplain followed up with patient in Hemodialysis, and offered more empathic support and listening. Chaplain will check in with folks around patient's son and offer them support. Chaplain services available as needed.   Jeri Lager, Chaplain 10/24/2015 6:32 PM

## 2015-10-24 NOTE — Progress Notes (Signed)
   10/24/15 1525  Clinical Encounter Type  Visited With Patient  Visit Type Initial;Spiritual support;Social support  Referral From Patient;Nurse  Spiritual Encounters  Spiritual Needs Emotional  Stress Factors  Patient Stress Factors Health changes;Family relationships   Chaplain responded to a request to visit with patient after patient learned of patient's son being on life support. Chaplain met with patient and offered empathic listening, facilitated life review, and support. Chaplain was paged away, but will seek to follow-up.   Jeri Lager, Chaplain 10/24/2015 4:31 PM

## 2015-10-24 NOTE — Progress Notes (Signed)
Called hemodialysis to find out patient will be picked for treatment only around 1700. Suggested transfusing the first unit of blood here on the floor but patient verbalized she prefers blood transfusion  be done during HD.

## 2015-10-24 NOTE — Progress Notes (Signed)
     [facility] Progress Note  Subjective:   Hgb 6.1.  Flex sig 10/23/15:thrombosed hemorrhoids and anal ulcers noted.Biopsies pending.For dialysis later today, has 2 units prbcs ordered. Upset,tearful-- grandson hospitalized after being found unconscious from overdose.No N/V or abd pain.   Objective:  Vital signs in last 24 hours: Temp:  [98.2 F (36.8 C)-99.2 F (37.3 C)] 98.4 F (36.9 C) (03/11 0740) Pulse Rate:  [77-103] 77 (03/11 0740) Resp:  [9-22] 14 (03/11 0740) BP: (75-149)/(33-84) 132/74 mmHg (03/11 0740) SpO2:  [92 %-100 %] 97 % (03/11 0740) Last BM Date: 10/23/15 General:   Alert,  Well-developed,    in NAD Heart:  Regular rate and rhythm; no murmurs Pulm;lungs clear Abdomen:  Soft, nontender and nondistended. Normal bowel sounds, without guarding, and without rebound.   Extremities:  Without edema.   Intake/Output from previous day: 03/10 0701 - 03/11 0700 In: 880 [P.O.:720; I.V.:160] Out: -  Intake/Output this shift: Total I/O In: 600 [P.O.:600] Out: -   Lab Results:  Recent Labs  10/23/15 0952 10/23/15 1819 10/24/15 0209  WBC 18.2* 10.0 10.4  HGB 8.4* 6.7* 6.1*  HCT 25.0* 20.8* 18.7*  PLT 279 209 222   BMET  Recent Labs  10/21/15 1930 10/21/15 1949 10/22/15 0243  NA 138 136 140  K 3.7 3.6 3.9  CL 94* 92* 94*  CO2 28  --  28  GLUCOSE 157* 153* 105*  BUN 35* 35* 35*  CREATININE 6.39* 5.90* 6.63*  CALCIUM 8.6*  --  8.5*   LFT  Recent Labs  10/22/15 0243  PROT 6.2*  ALBUMIN 2.3*  AST 12*  ALT 7*  ALKPHOS 76  BILITOT 0.4   PT/INR  Recent Labs  10/21/15 1930  LABPROT 16.7*  INR 1.34     ASSESSMENT/PLAN:  65 yo female admitted with lower GI bleeding. Flex sig with thromboswed hemorroids as well as anal ulcers, biopsies pending. Hgb 6.1, 2 units prbcs ordered to be transfused with dialysis. Continue supportive care.  ESRD on hemodialysis Hypothyroidism Type 2 diabetes Hypertension     LOS: 3 days    Ahnika Hannibal P PA-C 10/24/2015, Pager [phone] Mon-Fri 8a-5p [phone] after 5p, weekends, holidays

## 2015-10-24 NOTE — Progress Notes (Signed)
  [facility] Progress Note   Subjective: stable  Filed Vitals:   10/23/15 1955 10/23/15 2340 10/24/15 0335 10/24/15 0740  BP: 107/64 101/50  132/74  Pulse:    77  Temp: 98.2 F (36.8 C) 98.6 F (37 C) 98.4 F (36.9 C) 98.4 F (36.9 C)  TempSrc: Oral Oral Oral Oral  Resp: 15 16  14   Height:      Weight:      SpO2: 98% 100%  97%    Inpatient medications: . sodium chloride   Intravenous Once  . budesonide  0.25 mg Inhalation BID  . DULoxetine  30 mg Oral Daily   And  . DULoxetine  60 mg Oral QHS  . famotidine  20 mg Oral Daily  . ferric gluconate (FERRLECIT/NULECIT) IV  250 mg Intravenous Q T,Th,Sa-HD  . insulin aspart  0-9 Units Subcutaneous Q4H  . insulin glargine  10 Units Subcutaneous QHS  . levothyroxine  150 mcg Oral QAC breakfast  . pantoprazole (PROTONIX) IV  40 mg Intravenous Q12H  . polyethylene glycol  17 g Oral BID     acetaminophen **OR** acetaminophen, HYDROcodone-acetaminophen, ondansetron **OR** ondansetron (ZOFRAN) IV  Exam: Obese WF no distress, calm No jvd Chest clear bilat RRR soft SEM no RG ABd obese soft ntnd no ascites or mass MS no joint effusion/ deform Ext no LE or UE edema L forearm AVF +bruit  Home meds >  Zyrtec, tums, D3, B12, cymbalta, flovent,  norco, Lantus 25 , synthroid, zantac, vit C, KCl   Lasix 80 bid,Lopressor 50x 2  Dialysis: TTS NW 4h 104kg 400/1.5 2/2 bath Heparin 5000 (holding for GIB)  Hect 2 ug > pth 251, Ca/P ok Venofer 50 / wk > tsat 12%/ ferr 283/ Hb 9.7 2/25  Assessment: 1 Gastrointestinal bleed- stercoral ulcers on flex sig. Hb 6.1 2 ESRD HD TTS 3 Anemia of CKD/ ABL -loading w IV Fe. 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 5 DM2 on insulin 6 HTN lopressor bid 7 Volume - no vol excess, 1kg +  Plan - HD today, no hep, transfuse prbc's w hd    [name] MD [place] pager [phone]    cell [phone] 10/24/2015,  11:43 AM    Recent Labs Lab 10/21/15 1930 10/21/15 1949 10/22/15 0243  NA 138 136 140  K 3.7 3.6 3.9  CL 94* 92* 94*  CO2 28  --  28  GLUCOSE 157* 153* 105*  BUN 35* 35* 35*  CREATININE 6.39* 5.90* 6.63*  CALCIUM 8.6*  --  8.5*    Recent Labs Lab 10/21/15 1930 10/22/15 0243  AST 18 12*  ALT 8* 7*  ALKPHOS 85 76  BILITOT 0.7 0.4  PROT 7.1 6.2*  ALBUMIN 2.5* 2.3*    Recent Labs Lab 10/21/15 1930  10/23/15 0952 10/23/15 1819 10/24/15 0209  WBC 12.4*  < > 18.2* 10.0 10.4  NEUTROABS 10.8*  --   --   --   --   HGB 10.2*  < > 8.4* 6.7* 6.1*  HCT 31.5*  < > 25.0* 20.8* 18.7*  MCV 94.6  < > 88.3 89.3 89.5  PLT 323  < > 279 209 222  < > = values in this interval not displayed.

## 2015-10-25 ENCOUNTER — Inpatient Hospital Stay (HOSPITAL_COMMUNITY): Payer: Medicare Other

## 2015-10-25 ENCOUNTER — Other Ambulatory Visit: Payer: Self-pay

## 2015-10-25 DIAGNOSIS — I471 Supraventricular tachycardia: Secondary | ICD-10-CM

## 2015-10-25 LAB — CBC
HCT: 25.4 % — ABNORMAL LOW (ref 36.0–46.0)
HEMATOCRIT: 25.4 % — AB (ref 36.0–46.0)
HEMATOCRIT: 27.2 % — AB (ref 36.0–46.0)
HEMOGLOBIN: 8.2 g/dL — AB (ref 12.0–15.0)
HEMOGLOBIN: 8.7 g/dL — AB (ref 12.0–15.0)
Hemoglobin: 8.7 g/dL — ABNORMAL LOW (ref 12.0–15.0)
MCH: 29.8 pg (ref 26.0–34.0)
MCH: 28.4 pg (ref 26.0–34.0)
MCH: 28.2 pg (ref 26.0–34.0)
MCHC: 34.3 g/dL (ref 30.0–36.0)
MCHC: 32.3 g/dL (ref 30.0–36.0)
MCHC: 32 g/dL (ref 30.0–36.0)
MCV: 87 fL (ref 78.0–100.0)
MCV: 87.9 fL (ref 78.0–100.0)
MCV: 88 fL (ref 78.0–100.0)
PLATELETS: 226 10*3/uL (ref 150–400)
Platelets: 210 10*3/uL (ref 150–400)
Platelets: 215 10*3/uL (ref 150–400)
RBC: 2.92 MIL/uL — ABNORMAL LOW (ref 3.87–5.11)
RBC: 2.89 MIL/uL — ABNORMAL LOW (ref 3.87–5.11)
RBC: 3.09 MIL/uL — AB (ref 3.87–5.11)
RDW: 17.9 % — ABNORMAL HIGH (ref 11.5–15.5)
RDW: 18 % — ABNORMAL HIGH (ref 11.5–15.5)
RDW: 18.4 % — ABNORMAL HIGH (ref 11.5–15.5)
WBC: 12.1 10*3/uL — ABNORMAL HIGH (ref 4.0–10.5)
WBC: 10.7 10*3/uL — ABNORMAL HIGH (ref 4.0–10.5)
WBC: 13.2 10*3/uL — ABNORMAL HIGH (ref 4.0–10.5)

## 2015-10-25 LAB — TYPE AND SCREEN
ABO/RH(D): O POS
ANTIBODY SCREEN: NEGATIVE
UNIT DIVISION: 0
UNIT DIVISION: 0
Unit division: 0
Unit division: 0
Unit division: 0

## 2015-10-25 LAB — BLOOD GAS, ARTERIAL
ACID-BASE EXCESS: 1 mmol/L (ref 0.0–2.0)
Bicarbonate: 25 mEq/L — ABNORMAL HIGH (ref 20.0–24.0)
Drawn by: 283401
FIO2: 21
O2 SAT: 92.2 %
PATIENT TEMPERATURE: 98.6
PCO2 ART: 39.7 mmHg (ref 35.0–45.0)
PO2 ART: 64.5 mmHg — AB (ref 80.0–100.0)
TCO2: 26.3 mmol/L (ref 0–100)
pH, Arterial: 7.417 (ref 7.350–7.450)

## 2015-10-25 LAB — BASIC METABOLIC PANEL
ANION GAP: 14 (ref 5–15)
BUN: 14 mg/dL (ref 6–20)
CHLORIDE: 97 mmol/L — AB (ref 101–111)
CO2: 26 mmol/L (ref 22–32)
Calcium: 8.4 mg/dL — ABNORMAL LOW (ref 8.9–10.3)
Creatinine, Ser: 4.31 mg/dL — ABNORMAL HIGH (ref 0.44–1.00)
GFR calc Af Amer: 12 mL/min — ABNORMAL LOW (ref >60)
GFR, EST NON AFRICAN AMERICAN: 10 mL/min — AB (ref >60)
GLUCOSE: 94 mg/dL (ref 65–99)
POTASSIUM: 4.2 mmol/L (ref 3.5–5.1)
Sodium: 137 mmol/L (ref 135–145)

## 2015-10-25 LAB — GLUCOSE, CAPILLARY
GLUCOSE-CAPILLARY: 84 mg/dL (ref 65–99)
Glucose-Capillary: 124 mg/dL — ABNORMAL HIGH (ref 65–99)
Glucose-Capillary: 126 mg/dL — ABNORMAL HIGH (ref 65–99)
Glucose-Capillary: 76 mg/dL (ref 65–99)
Glucose-Capillary: 78 mg/dL (ref 65–99)
Glucose-Capillary: 80 mg/dL (ref 65–99)

## 2015-10-25 LAB — TROPONIN I: Troponin I: <0.03 ng/mL (ref <0.031)

## 2015-10-25 LAB — LACTIC ACID, PLASMA: Lactic Acid, Venous: 1.3 mmol/L (ref 0.5–2.0)

## 2015-10-25 MED ORDER — ADENOSINE 6 MG/2ML IV SOLN
6.0000 mg | Freq: Once | INTRAVENOUS | Status: AC
  Administered 2015-10-25: 6 mg via INTRAVENOUS

## 2015-10-25 MED ORDER — SODIUM CHLORIDE 0.9 % IV BOLUS (SEPSIS)
500.0000 mL | Freq: Once | INTRAVENOUS | Status: AC
  Administered 2015-10-25: 500 mL via INTRAVENOUS

## 2015-10-25 MED ORDER — SODIUM CHLORIDE 0.9 % IV BOLUS (SEPSIS)
1000.0000 mL | Freq: Once | INTRAVENOUS | Status: AC
  Administered 2015-10-25: 1000 mL via INTRAVENOUS

## 2015-10-25 MED ORDER — ADENOSINE 6 MG/2ML IV SOLN
INTRAVENOUS | Status: AC
  Filled 2015-10-25: qty 2

## 2015-10-25 MED ORDER — DILTIAZEM HCL 25 MG/5ML IV SOLN
5.0000 mg | Freq: Once | INTRAVENOUS | Status: AC
  Administered 2015-10-25: 5 mg via INTRAVENOUS
  Filled 2015-10-25: qty 5

## 2015-10-25 MED ORDER — METOPROLOL TARTRATE 12.5 MG HALF TABLET
12.5000 mg | ORAL_TABLET | Freq: Once | ORAL | Status: AC
  Administered 2015-10-25: 12.5 mg via ORAL
  Filled 2015-10-25: qty 1

## 2015-10-25 MED ORDER — DILTIAZEM HCL 100 MG IV SOLR: 5.0000 mg/h | INTRAVENOUS | Status: DC

## 2015-10-25 NOTE — Progress Notes (Signed)
[facility]                                                                              Patient Demographics  Tami Elliott, is a 65 y.o. female, DOB - 15-Dec-1950, KX:[id number]  Admit date - 10/21/2015   Admitting Physician Rise Patience, MD  Outpatient Primary MD for the patient is Harvie Junior, MD  LOS - 4   Chief Complaint  Patient presents with  . GI Bleeding       Brief HPI   65 year old female with ESRD on HD, TTS, diabetes type 2,Presented with 2 episodes of GI bleeding, described as large clots, dark. No nausea, vomiting, abdominal pain or fevers.   Assessment & Plan    Principal Problem:   Acute GI bleeding, Acute blood loss anemia - Continue NPO, gentle hydration, IV PPI - s/p Transfusion 1 unit packed RBCs, Will obtain 2 more during Dialysis today. - GI on board and patient is status post flexible sigmoidoscopy. Blood noted upstream as such current thought is patient may have had bleeding from stercoral ulcers - Patient had a half a point drop in hemoglobin. Discussed with patient and would like to observe for another 23 hours. Patient agreeable will reassess hemoglobin levels next a.m.  Active Problems: ESRD on hemodialysis: TTS - Nephrology managing.    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 sodium chloride   Intravenous Once  . budesonide  0.25 mg Inhalation BID  . DULoxetine  30 mg Oral Daily   And  . DULoxetine  60 mg Oral QHS  . famotidine  20 mg Oral Daily  . ferric gluconate (FERRLECIT/NULECIT) IV  250 mg Intravenous Q T,Th,Sa-HD  . insulin aspart  0-9 Units Subcutaneous Q4H  . insulin glargine  10 Units Subcutaneous QHS  . levothyroxine  150 mcg Oral QAC breakfast  . pantoprazole (PROTONIX) IV  40 mg Intravenous Q12H  . polyethylene glycol  17 g Oral BID   Continuous Infusions:   PRN Meds:.acetaminophen **OR** acetaminophen, HYDROcodone-acetaminophen, ondansetron **OR** ondansetron (ZOFRAN) IV   Antibiotics   Anti-infectives    None        Subjective:   Tami Elliott patient has no new complaints no acute issues overnight denies any acute bleed or bright red blood per rectum   Objective:   Filed Vitals:   10/25/15 0100 10/25/15 0400 10/25/15 0500 10/25/15 0802  BP: 132/71 103/59  139/84  Pulse: 86 94  79  Temp:  99.7 F (37.6 C)  98.9 F (37.2 C)  TempSrc:  Oral  Oral  Resp: 18 16  15   Height:      Weight:   103.7 kg (228 lb 9.9 oz)   SpO2: 92% 96%  98%    Intake/Output Summary (Last 24 hours) at 10/25/15 1108 Last data filed at 10/25/15 0900  Gross per 24 hour  Intake    970 ml  Output    900 ml  Net     70 ml     Wt Readings from Last 3 Encounters:  10/25/15 103.7 kg (228 lb 9.9 oz)  08/08/15 113.4 kg (250 lb)  06/29/15 116.121 kg (256 lb)     Exam  General: Alert and oriented x 3, NAD  HEENT:  PERRLA, EOMI  Neck: Supple, no JVD  CVS: S1 S2 auscultated, no rubs, murmurs or gallops. Regular rate and rhythm.  Respiratory: Clear to auscultation bilaterally, no wheezing, rales or rhonchi  Abdomen: Soft, No significant tenderness , nondistended, + bowel sounds  Ext: no cyanosis clubbing or edema  Neuro: AAOx3, no facial asymmetry  Skin: No rashes  Psych: Normal affect and  demeanor   Data Review   Micro Results Recent Results (from the past 240 hour(s))  MRSA PCR Screening     Status: None   Collection Time: 10/22/15  1:08 AM  Result Value Ref Range Status   MRSA by PCR NEGATIVE NEGATIVE Final    Comment:        The GeneXpert MRSA Assay (FDA approved for NASAL specimens only), is one component of a comprehensive MRSA colonization surveillance program. It is not intended to diagnose MRSA infection nor to guide or monitor treatment for MRSA infections.     Radiology Reports No results found.  CBC  Recent Labs Lab 10/21/15 1930  10/23/15 0952 10/23/15 1819 10/24/15 0209 10/24/15 2315 10/25/15 0726  WBC 12.4*  < > 18.2* 10.0 10.4 12.1* 10.7*  HGB 10.2*  < > 8.4* 6.7* 6.1* 8.7* 8.2*  HCT 31.5*  < > 25.0* 20.8* 18.7* 25.4* 25.4*  PLT 323  < > 279 209 222 226 210  MCV 94.6  < > 88.3 89.3 89.5 87.0 87.9  MCH 30.6  < > 29.7 28.8 29.2 29.8 28.4  MCHC 32.4  < > 33.6 32.2 32.6 34.3 32.3  RDW 17.7*  < > 19.6* 19.7* 19.4* 17.9* 18.0*  LYMPHSABS 0.9  --   --   --   --   --   --   MONOABS 0.7  --   --   --   --   --   --   EOSABS 0.1  --   --   --   --   --   --   BASOSABS 0.1  --   --   --   --   --   --   < > = values in this interval not displayed.  Chemistries   Recent Labs Lab 10/21/15 1930 10/21/15 1949 10/22/15 0243  NA 138 136 140  K 3.7 3.6 3.9  CL 94* 92* 94*  CO2 28  --  28  GLUCOSE 157* 153* 105*  BUN 35* 35* 35*  CREATININE 6.39* 5.90* 6.63*  CALCIUM 8.6*  --  8.5*  AST 18  --  12*  ALT 8*  --  7*  ALKPHOS 85  --  76  BILITOT 0.7  --  0.4   ------------------------------------------------------------------------------------------------------------------ estimated creatinine clearance is  10.2 mL/min (by C-G formula based on Cr of 6.63). ------------------------------------------------------------------------------------------------------------------ No results for input(s): HGBA1C in the last 72  hours. ------------------------------------------------------------------------------------------------------------------ No results for input(s): CHOL, HDL, LDLCALC, TRIG, CHOLHDL, LDLDIRECT in the last 72 hours. ------------------------------------------------------------------------------------------------------------------ No results for input(s): TSH, T4TOTAL, T3FREE, THYROIDAB in the last 72 hours.  Invalid input(s): FREET3 ------------------------------------------------------------------------------------------------------------------ No results for input(s): VITAMINB12, FOLATE, FERRITIN, TIBC, IRON, RETICCTPCT in the last 72 hours.  Coagulation profile  Recent Labs Lab 10/21/15 1930  INR 1.34    No results for input(s): DDIMER in the last 72 hours.  Cardiac Enzymes No results for input(s): CKMB, TROPONINI, MYOGLOBIN in the last 168 hours.  Invalid input(s): CK ------------------------------------------------------------------------------------------------------------------ Invalid input(s): [place]  10/24/15 0959 10/24/15 1308 10/24/15 2238 10/25/15 0029 10/25/15 0410 10/25/15 0801  GLUCAP 78 113* 70 124* 126* 76     Velvet Bathe M.D. [facility] 10/25/2015, 11:07 AM  Pager: [place]:[phone] Between 7am to 7pm - call Pager - [phone]  After 7pm go to [email or web address] - password [email or web address]  Call night coverage person covering after 7pm

## 2015-10-25 NOTE — Progress Notes (Signed)
Pt went into SVT in 150s, initially asymptomatic but then developed palpitations. No visible bleeding. BP stable at 115/71. 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 Claiborne Billings, more than likely related to underlying condition. Will give low dose PO metoprolol, stat labs to follow H&H, fluid bolus, and re-evaluate.

## 2015-10-25 NOTE — Consult Note (Signed)
Reason for Consult: tachycardia Cardiologist: new Referring Physician: Dr. Jeralene Peters is an 65 y.o. female.  HPI: Ms. Losasso is a 65 yo woman with ESRD on hemodialysis Tues/Thurs/Saturday, T2DM admitted with GI bleeding and this evening went into tachycardia. She was largely asymptomatic. However, on history, she had a stressful situation with a visitor (family/friend) before going into tachycardia. She barely "knew/felt" she had faster HR. Her blood pressure was stable and initially attempted to manage by resuscitation, checking h/h but her blood pressure trended down. EKG without overt p-waves, 12/16 echo with preserved EF 55-60%, grade I DD, PA pressure 53 mmHg and baseline ECG with RBBB. Thus, adenosine 6 mg given with rapid assessment nurse, primary RN, pacing pads/zoll monitor in place per protocol. I discussed the procedure with ms. Roel including risks/benefits and she agreed. Her tachycardia converted to sinus rhythm. The strips stopped printed right before conversion but conversion suggests SVT ~ AVNRT. Her blood pressure immediately returned to normal.   Past Medical History  Diagnosis Date  . Diabetes mellitus   . Hypertension   . Hypothyroidism   . Cataract     Left eye( Dr. Katy Fitch, patient uninsured so she can't  get  an intervention)  . Chronic kidney disease     nephrolithiasis, s/p removal 12/05/07  . Back pain     myofascial, excacerberated in 6-7/09, 9/09- required narcotics at those times  . Elbow fracture, right   . Obesity   . Depression     better on nortriptyline  . Hyperplastic colon polyp 2001    f/u colonoscopy 11/23/06 was normal( Dr. Carlean Purl), falls into normal risk screening( next colonoscopy due in 2018)  . Pap smear for cervical cancer screening     12/14/2005: normal, evidence of candida. 05/16/2007: normal, benign reperative changes.  . Anemia   . Sleep apnea   . Pneumonia     as a child  . GERD (gastroesophageal reflux disease)   .  Arthritis   . Tongue cancer ([place]) 06/16/2015    SCCa of Left Lateral Tongue  . Nephrolithiasis 11/2007, 01/2012  . Cancer ([place]) 06/08/15 bx    SCC of left lateral tongue   . Acute pulmonary edema ([place]) 08/03/2015  . Anorectal ulcer 10/23/2015    Past Surgical History  Procedure Laterality Date  . Cataract extraction Left   . Lithotripsy  11/2007  . Dilation and curettage of uterus    . Av fistula placement Left 05/12/2015    Procedure: ARTERIOVENOUS (AV) FISTULA CREATION;  Surgeon: Serafina Mitchell, MD;  Location: [facility];  Service: Vascular;  Laterality: Left;  . Av fistula placement  05/12/15    left fore arm  . Flexible sigmoidoscopy N/A 10/23/2015    Procedure: FLEXIBLE SIGMOIDOSCOPY;  Surgeon: Gatha Mayer, MD;  Location: [facility];  Service: Endoscopy;  Laterality: N/A;    Family History  Problem Relation Age of Onset  . Diabetes Mother   . Heart disease Mother     before age 55  . Hypertension Mother     Social History:  reports that she has never smoked. She has never used smokeless tobacco. She reports that she does not drink alcohol or use illicit drugs.  Allergies: No Known Allergies  Medications:  I have reviewed the patient's current medications. Prior to Admission:  Prescriptions prior to admission  Medication Sig Dispense Refill Last Dose  . calcium carbonate (TUMS - DOSED IN MG ELEMENTAL CALCIUM) 500 MG chewable tablet Chew 2 tablets  by mouth daily as needed for indigestion or heartburn.   Past Week at Unknown time  . cetirizine (ZYRTEC) 10 MG tablet Take 10 mg by mouth daily as needed for allergies.   5 10/21/2015 at Unknown time  . Cholecalciferol (VITAMIN D3) 2000 UNITS TABS Take 1 capsule by mouth daily.   10/21/2015 at Unknown time  . Cyanocobalamin (VITAMIN B-12) 1000 MCG SUBL Place 1 tablet under the tongue daily.   10/21/2015 at Unknown time  . DULoxetine (CYMBALTA) 30 MG capsule Take 30-60 mg by mouth See admin instructions. Pt takes 50m in am and 69min pm    10/21/2015 at Unknown time  . fluticasone (FLOVENT HFA) 44 MCG/ACT inhaler Inhale 1 puff into the lungs 2 (two) times daily.   10/21/2015 at unknown  . furosemide (LASIX) 80 MG tablet Take 1 tablet (80 mg total) by mouth 2 (two) times daily. Fluid retention 60 tablet 0 10/21/2015 at Unknown time  . HYDROcodone-acetaminophen (NORCO) 10-325 MG tablet Take 1 tablet by mouth every 6 (six) hours as needed. (Patient taking differently: Take 1 tablet by mouth every 6 (six) hours as needed for moderate pain. ) 20 tablet 0 10/20/2015 at Unknown time  . insulin glargine (LANTUS) 100 UNIT/ML injection Inject 0.25 mLs (25 Units total) into the skin at bedtime. 10 mL 11 10/20/2015 at Unknown time  . levothyroxine (SYNTHROID, LEVOTHROID) 150 MCG tablet Take 150 mcg by mouth daily.     10/21/2015 at Unknown time  . metoprolol (LOPRESSOR) 50 MG tablet Take 50 mg by mouth 2 (two) times daily.   10/21/2015 at 0800  . ranitidine (ZANTAC) 300 MG tablet Take 300 mg by mouth daily as needed for heartburn.   Past Week at Unknown time  . vitamin C (ASCORBIC ACID) 500 MG tablet Take 500 mg by mouth daily.   10/21/2015 at Unknown time  . potassium chloride 20 MEQ TBCR Take 20 mEq by mouth 2 (two) times daily. (Patient not taking: Reported on 10/21/2015) 60 tablet 0 Not Taking at Unknown time   Scheduled: . sodium chloride   Intravenous Once  . budesonide  0.25 mg Inhalation BID  . DULoxetine  30 mg Oral Daily   And  . DULoxetine  60 mg Oral QHS  . famotidine  20 mg Oral Daily  . ferric gluconate (FERRLECIT/NULECIT) IV  250 mg Intravenous Q T,Th,Sa-HD  . insulin aspart  0-9 Units Subcutaneous Q4H  . insulin glargine  10 Units Subcutaneous QHS  . levothyroxine  150 mcg Oral QAC breakfast  . pantoprazole (PROTONIX) IV  40 mg Intravenous Q12H  . polyethylene glycol  17 g Oral BID  . sodium chloride  500 mL Intravenous Once   Continuous:   Results for orders placed or performed during the hospital encounter of 10/21/15 (from the past  48 hour(s))  Glucose, capillary     Status: None   Collection Time: 10/23/15 11:44 PM  Result Value Ref Range   Glucose-Capillary 93 65 - 99 mg/dL  CBC     Status: Abnormal   Collection Time: 10/24/15  2:09 AM  Result Value Ref Range   WBC 10.4 4.0 - 10.5 K/uL   RBC 2.09 (L) 3.87 - 5.11 MIL/uL   Hemoglobin 6.1 (LL) 12.0 - 15.0 g/dL    Comment: REPEATED TO VERIFY CRITICAL VALUE NOTED.  VALUE IS CONSISTENT WITH PREVIOUSLY REPORTED AND CALLED VALUE.    HCT 18.7 (L) 36.0 - 46.0 %   MCV 89.5 78.0 - 100.0 fL  MCH 29.2 26.0 - 34.0 pg   MCHC 32.6 30.0 - 36.0 g/dL   RDW 19.4 (H) 11.5 - 15.5 %   Platelets 222 150 - 400 K/uL  Glucose, capillary     Status: None   Collection Time: 10/24/15  3:37 AM  Result Value Ref Range   Glucose-Capillary 89 65 - 99 mg/dL  Glucose, capillary     Status: Abnormal   Collection Time: 10/24/15  8:19 AM  Result Value Ref Range   Glucose-Capillary 47 (L) 65 - 99 mg/dL  Glucose, capillary     Status: None   Collection Time: 10/24/15  9:59 AM  Result Value Ref Range   Glucose-Capillary 78 65 - 99 mg/dL  Glucose, capillary     Status: Abnormal   Collection Time: 10/24/15  1:08 PM  Result Value Ref Range   Glucose-Capillary 113 (H) 65 - 99 mg/dL  Glucose, capillary     Status: None   Collection Time: 10/24/15 10:38 PM  Result Value Ref Range   Glucose-Capillary 70 65 - 99 mg/dL   Comment 1 Notify RN   CBC     Status: Abnormal   Collection Time: 10/24/15 11:15 PM  Result Value Ref Range   WBC 12.1 (H) 4.0 - 10.5 K/uL    Comment: REPEATED TO VERIFY WHITE COUNT CONFIRMED ON SMEAR    RBC 2.92 (L) 3.87 - 5.11 MIL/uL   Hemoglobin 8.7 (L) 12.0 - 15.0 g/dL    Comment: POST TRANSFUSION SPECIMEN REPEATED TO VERIFY    HCT 25.4 (L) 36.0 - 46.0 %   MCV 87.0 78.0 - 100.0 fL   MCH 29.8 26.0 - 34.0 pg   MCHC 34.3 30.0 - 36.0 g/dL   RDW 17.9 (H) 11.5 - 15.5 %   Platelets 226 150 - 400 K/uL    Comment: REPEATED TO VERIFY PLATELET COUNT CONFIRMED BY SMEAR    Glucose, capillary     Status: Abnormal   Collection Time: 10/25/15 12:29 AM  Result Value Ref Range   Glucose-Capillary 124 (H) 65 - 99 mg/dL  Glucose, capillary     Status: Abnormal   Collection Time: 10/25/15  4:10 AM  Result Value Ref Range   Glucose-Capillary 126 (H) 65 - 99 mg/dL  CBC     Status: Abnormal   Collection Time: 10/25/15  7:26 AM  Result Value Ref Range   WBC 10.7 (H) 4.0 - 10.5 K/uL   RBC 2.89 (L) 3.87 - 5.11 MIL/uL   Hemoglobin 8.2 (L) 12.0 - 15.0 g/dL   HCT 25.4 (L) 36.0 - 46.0 %   MCV 87.9 78.0 - 100.0 fL   MCH 28.4 26.0 - 34.0 pg   MCHC 32.3 30.0 - 36.0 g/dL   RDW 18.0 (H) 11.5 - 15.5 %   Platelets 210 150 - 400 K/uL  Glucose, capillary     Status: None   Collection Time: 10/25/15  8:01 AM  Result Value Ref Range   Glucose-Capillary 76 65 - 99 mg/dL  Glucose, capillary     Status: None   Collection Time: 10/25/15  1:23 PM  Result Value Ref Range   Glucose-Capillary 80 65 - 99 mg/dL  Glucose, capillary     Status: None   Collection Time: 10/25/15  3:27 PM  Result Value Ref Range   Glucose-Capillary 84 65 - 99 mg/dL  Glucose, capillary     Status: None   Collection Time: 10/25/15  7:40 PM  Result Value Ref Range   Glucose-Capillary 78 65 -  99 mg/dL  CBC     Status: Abnormal   Collection Time: 10/25/15  7:43 PM  Result Value Ref Range   WBC 13.2 (H) 4.0 - 10.5 K/uL   RBC 3.09 (L) 3.87 - 5.11 MIL/uL   Hemoglobin 8.7 (L) 12.0 - 15.0 g/dL   HCT 27.2 (L) 36.0 - 46.0 %   MCV 88.0 78.0 - 100.0 fL   MCH 28.2 26.0 - 34.0 pg   MCHC 32.0 30.0 - 36.0 g/dL   RDW 18.4 (H) 11.5 - 15.5 %   Platelets 215 150 - 400 K/uL  Troponin I (q 6hr x 3)     Status: None   Collection Time: 10/25/15  7:43 PM  Result Value Ref Range   Troponin I <0.03 <0.031 ng/mL    Comment:        NO INDICATION OF MYOCARDIAL INJURY.   Basic metabolic panel     Status: Abnormal   Collection Time: 10/25/15  7:43 PM  Result Value Ref Range   Sodium 137 135 - 145 mmol/L   Potassium  4.2 3.5 - 5.1 mmol/L   Chloride 97 (L) 101 - 111 mmol/L   CO2 26 22 - 32 mmol/L   Glucose, Bld 94 65 - 99 mg/dL   BUN 14 6 - 20 mg/dL   Creatinine, Ser 4.31 (H) 0.44 - 1.00 mg/dL   Calcium 8.4 (L) 8.9 - 10.3 mg/dL   GFR calc non Af Amer 10 (L) >60 mL/min   GFR calc Af Amer 12 (L) >60 mL/min    Comment: (NOTE) The eGFR has been calculated using the CKD EPI equation. This calculation has not been validated in all clinical situations. eGFR's persistently <60 mL/min signify possible Chronic Kidney Disease.    Anion gap 14 5 - 15    No results found.  Review of Systems  Constitutional: Positive for malaise/fatigue. Negative for diaphoresis.  HENT: Negative for ear discharge and ear pain.   Eyes: Negative for blurred vision, double vision and photophobia.  Respiratory: Negative for cough, hemoptysis and sputum production.   Cardiovascular: Positive for palpitations. Negative for chest pain, orthopnea and PND.  Gastrointestinal: Positive for blood in stool. Negative for nausea, vomiting and abdominal pain.  Genitourinary: Negative for dysuria and urgency.  Musculoskeletal: Negative for myalgias and neck pain.  Neurological: Negative for dizziness, tingling and tremors.  Endo/Heme/Allergies: Negative for polydipsia. Bruises/bleeds easily.  Psychiatric/Behavioral: Negative for suicidal ideas, hallucinations and substance abuse.   Blood pressure 115/71, pulse 162, temperature 98.7 F (37.1 C), temperature source Oral, resp. rate 14, height '5\' 4"'  (1.626 m), weight 103.7 kg (228 lb 9.9 oz), SpO2 94 %. Physical Exam  Nursing note and vitals reviewed. Constitutional: She is oriented to person, place, and time. She appears well-developed and well-nourished. No distress.  HENT:  Head: Normocephalic and atraumatic.  Nose: Nose normal.  Mouth/Throat: Oropharynx is clear and moist. No oropharyngeal exudate.  Eyes: Conjunctivae and EOM are normal. Pupils are equal, round, and reactive to light.  No scleral icterus.  Neck: Normal range of motion. Neck supple. JVD present. No tracheal deviation present.  Cardiovascular: Regular rhythm and intact distal pulses.  Exam reveals gallop.   No murmur heard. tachycardia  Respiratory: Effort normal. No respiratory distress. She has no wheezes. She has rales.  GI: Soft. Bowel sounds are normal. She exhibits no distension. There is no tenderness. There is no rebound.  Musculoskeletal: Normal range of motion. She exhibits edema. She exhibits no tenderness.  Neurological: She is  alert and oriented to person, place, and time. No cranial nerve deficit. Coordination normal.  Skin: Skin is warm and dry. No rash noted. She is not diaphoretic. No erythema.  lukewarm  Psychiatric: She has a normal mood and affect. Her behavior is normal. Thought content normal.   Labs reviewed EKG reviewed SVT with RBBB (known)  Assessment/Plan: Ms. Whelpley is a 65 yo woman with ESRD on hemodialysis Tues/Thurs/Saturday, T2DM admitted with GI bleeding and this evening went into tachycardia. Differential includes SVT - AVNRT, AVRT, VT, atrial fibrillation with aberrancy. Given structurally normal heart from 12/16 echo, less likely VT. However, persistent SVT with hypotension so adenosine used and converted suggesting SVT ~ AVNRT. She feels well currently. No further medications currently. If she has recurrent SVT or symptoms, then low dose metoprolol can be used and we can consider an EP consult.  Problem List GI Bleed Tachycardia - SVT S/p conversion with adenosine ESRD  Plan 1. Continue treatment for underlying medical conditions - ESRD/volume overload/GI Bleeding 2. PRN metoprolol 2.5 mg IV or 12.5 mg PO for SVT 3. If recurrent symptoms, consider EP consult 4. Please consult cardiology again for further questions/concerns  Trusten Hume 10/25/2015, 11:32 PM

## 2015-10-25 NOTE — Progress Notes (Signed)
Came to visit pt. She is not in room, nurse reports she went to visit the room of her grandson. Rec 2 units prbcs in dialysis yesterday.Hgb 8.2. Nurse reports no further bleeding.Revisit in am.  Arta Bruce, PA-C [facility]

## 2015-10-25 NOTE — Progress Notes (Signed)
  Tami Tami Elliott Progress Note   Subjective: stable  Filed Vitals:   10/25/15 0400 10/25/15 0500 10/25/15 0802 10/25/15 1324  BP: 103/59  139/84 125/73  Pulse: 94  79 86  Temp: 99.7 F (37.6 C)  98.9 F (37.2 C) 97.7 F (36.5 C)  TempSrc: Oral  Oral Axillary  Resp: 16  15 17   Height:      Weight:  103.7 kg (228 lb 9.9 oz)    SpO2: 96%  98% 99%    Inpatient medications: . sodium chloride   Intravenous Once  . budesonide  0.25 mg Inhalation BID  . DULoxetine  30 mg Oral Daily   And  . DULoxetine  60 mg Oral QHS  . famotidine  20 mg Oral Daily  . ferric gluconate (FERRLECIT/NULECIT) IV  250 mg Intravenous Q T,Th,Sa-HD  . insulin aspart  0-9 Units Subcutaneous Q4H  . insulin glargine  10 Units Subcutaneous QHS  . levothyroxine  150 mcg Oral QAC breakfast  . pantoprazole (PROTONIX) IV  40 mg Intravenous Q12H  . polyethylene glycol  17 g Oral BID     acetaminophen **OR** acetaminophen, HYDROcodone-acetaminophen, ondansetron **OR** ondansetron (ZOFRAN) IV  Exam: Obese WF no distress, calm No jvd Chest clear bilat RRR soft SEM no RG ABd obese soft ntnd no ascites or mass MS no joint effusion/ deform Ext no LE or UE edema L forearm AVF +bruit  Home meds >  Zyrtec, tums, D3, B12, cymbalta, flovent,  norco, Lantus 25 , synthroid, zantac, vit C, KCl   Lasix 80 bid,Lopressor 50x 2  Dialysis: TTS NW 4h 104kg 400/1.5 2/2 bath Heparin 5000 (holding for GIB)  Hect 2 ug > pth 251, Ca/P ok Venofer 50 / wk > tsat 12%/ ferr 283/ Hb 9.7 2/25  Assessment: 1 Gastrointestinal bleed- stercoral ulcers on flex sig. Hb 8.2 after 2u prbc on Sat. Following Hb, awaiting stabilization. No active bleeding now.  2 ESRD HD TTS 3 Anemia of CKD/ ABL -loading w IV Fe. 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 5 DM2 on insulin 6 HTN lopressor bid 7 Volume - no vol excess  Plan - HD TTS , no hep w GIB    Tami Splinter  MD [facility] Tami Elliott pager [phone]    cell [phone] 10/25/2015, 3:24 PM    Recent Labs Lab 10/21/15 1930 10/21/15 1949 10/22/15 0243  NA 138 136 140  K 3.7 3.6 3.9  CL 94* 92* 94*  CO2 28  --  28  GLUCOSE 157* 153* 105*  BUN 35* 35* 35*  CREATININE 6.39* 5.90* 6.63*  CALCIUM 8.6*  --  8.5*    Recent Labs Lab 10/21/15 1930 10/22/15 0243  AST 18 12*  ALT 8* 7*  ALKPHOS 85 76  BILITOT 0.7 0.4  PROT 7.1 6.2*  ALBUMIN 2.5* 2.3*    Recent Labs Lab 10/21/15 1930  10/24/15 0209 10/24/15 2315 10/25/15 0726  WBC 12.4*  < > 10.4 12.1* 10.7*  NEUTROABS 10.8*  --   --   --   --   HGB 10.2*  < > 6.1* 8.7* 8.2*  HCT 31.5*  < > 18.7* 25.4* 25.4*  MCV 94.6  < > 89.5 87.0 87.9  PLT 323  < > 222 226 210  < > = values in this interval not displayed.

## 2015-10-26 ENCOUNTER — Encounter (HOSPITAL_COMMUNITY): Payer: Self-pay | Admitting: Physician Assistant

## 2015-10-26 LAB — CBC
HEMATOCRIT: 25.4 % — AB (ref 36.0–46.0)
HEMOGLOBIN: 8.2 g/dL — AB (ref 12.0–15.0)
MCH: 28.7 pg (ref 26.0–34.0)
MCHC: 32.3 g/dL (ref 30.0–36.0)
MCV: 88.8 fL (ref 78.0–100.0)
Platelets: 216 10*3/uL (ref 150–400)
RBC: 2.86 MIL/uL — AB (ref 3.87–5.11)
RDW: 18.4 % — ABNORMAL HIGH (ref 11.5–15.5)
WBC: 12.3 10*3/uL — AB (ref 4.0–10.5)

## 2015-10-26 LAB — GLUCOSE, CAPILLARY
GLUCOSE-CAPILLARY: 98 mg/dL (ref 65–99)
GLUCOSE-CAPILLARY: 80 mg/dL (ref 65–99)
GLUCOSE-CAPILLARY: 111 mg/dL — AB (ref 65–99)
GLUCOSE-CAPILLARY: 87 mg/dL (ref 65–99)
Glucose-Capillary: 76 mg/dL (ref 65–99)
Glucose-Capillary: 95 mg/dL (ref 65–99)

## 2015-10-26 LAB — TSH: TSH: 3.242 u[IU]/mL (ref 0.350–4.500)

## 2015-10-26 LAB — TROPONIN I
TROPONIN I: 0.06 ng/mL — AB (ref <0.031)
Troponin I: 0.09 ng/mL — ABNORMAL HIGH (ref <0.031)

## 2015-10-26 MED ORDER — CALCIUM CARBONATE 1250 MG/5ML PO SUSP
500.0000 mg | Freq: Four times a day (QID) | ORAL | Status: DC | PRN
  Filled 2015-10-26: qty 5

## 2015-10-26 MED ORDER — NEPRO/CARBSTEADY PO LIQD: 237.0000 mL | Freq: Three times a day (TID) | ORAL | Status: DC | PRN

## 2015-10-26 MED ORDER — ZOLPIDEM TARTRATE 5 MG PO TABS: 5.0000 mg | ORAL_TABLET | Freq: Every evening | ORAL | Status: DC | PRN

## 2015-10-26 MED ORDER — ONDANSETRON HCL 4 MG PO TABS: 4.0000 mg | ORAL_TABLET | Freq: Four times a day (QID) | ORAL | Status: DC | PRN

## 2015-10-26 MED ORDER — ONDANSETRON HCL 4 MG/2ML IJ SOLN: 4.0000 mg | Freq: Four times a day (QID) | INTRAMUSCULAR | Status: DC | PRN

## 2015-10-26 MED ORDER — FAMOTIDINE 20 MG PO TABS: 20.0000 mg | ORAL_TABLET | Freq: Every day | ORAL | Status: DC | PRN

## 2015-10-26 MED ORDER — ACETAMINOPHEN 325 MG PO TABS: 650.0000 mg | ORAL_TABLET | Freq: Four times a day (QID) | ORAL | Status: DC | PRN

## 2015-10-26 MED ORDER — ACETAMINOPHEN 650 MG RE SUPP: 650.0000 mg | Freq: Four times a day (QID) | RECTAL | Status: DC | PRN

## 2015-10-26 MED ORDER — HYDROXYZINE HCL 25 MG PO TABS: 25.0000 mg | ORAL_TABLET | Freq: Three times a day (TID) | ORAL | Status: DC | PRN

## 2015-10-26 MED ORDER — DOCUSATE SODIUM 283 MG RE ENEM
1.0000 | ENEMA | RECTAL | Status: DC | PRN
  Filled 2015-10-26: qty 1

## 2015-10-26 MED ORDER — CAMPHOR-MENTHOL 0.5-0.5 % EX LOTN
1.0000 "application " | TOPICAL_LOTION | Freq: Three times a day (TID) | CUTANEOUS | Status: DC | PRN
  Filled 2015-10-26: qty 222

## 2015-10-26 MED ORDER — SORBITOL 70 % SOLN
30.0000 mL | Status: DC | PRN
  Filled 2015-10-26: qty 30

## 2015-10-26 NOTE — Progress Notes (Signed)
Daily Rounding Note  10/26/2015, 8:17 AM  LOS: 5 days   SUBJECTIVE:       Stools now brown.  Feels well.  No SOB or chest pain.  No dizziness.   OBJECTIVE:         Vital signs in last 24 hours:    Temp:  [97.7 F (36.5 C)-99 F (37.2 C)] 98.2 F (36.8 C) (03/13 0400) Pulse Rate:  [73-162] 73 (03/13 0400) Resp:  [8-23] 18 (03/13 0400) BP: (92-134)/(60-88) 119/68 mmHg (03/13 0400) SpO2:  [93 %-100 %] 95 % (03/13 0400) Weight:  [107.8 kg (237 lb 10.5 oz)] 107.8 kg (237 lb 10.5 oz) (03/13 0500) Last BM Date: 10/25/15 Filed Weights   10/24/15 2130 10/25/15 0500 10/26/15 0500  Weight: 103.4 kg (227 lb 15.3 oz) 103.7 kg (228 lb 9.9 oz) 107.8 kg (237 lb 10.5 oz)   General: obese, looks somewhat chronically ill.    Face: CDI scar at left mandible.  Heart: RRR Chest: clear bil.  No labored breathing.  Abdomen: soft, NT.  Active BS  Extremities: no CCE Neuro/Psych:  Pleasant, fully alert and oriented.  No gross deficits.   Intake/Output from previous day: 03/12 0701 - 03/13 0700 In: 2360 [P.O.:360; IV Piggyback:2000] Out: 600 [Urine:600]  Intake/Output this shift:    Lab Results:  Recent Labs  10/25/15 0726 10/25/15 1943 10/26/15 0105  WBC 10.7* 13.2* 12.3*  HGB 8.2* 8.7* 8.2*  HCT 25.4* 27.2* 25.4*  PLT 210 215 216   BMET  Recent Labs  10/25/15 1943  NA 137  K 4.2  CL 97*  CO2 26  GLUCOSE 94  BUN 14  CREATININE 4.31*  CALCIUM 8.4*   LFT No results for input(s): PROT, ALBUMIN, AST, ALT, ALKPHOS, BILITOT, BILIDIR, IBILI in the last 72 hours. PT/INR No results for input(s): LABPROT, INR in the last 72 hours. Hepatitis Panel No results for input(s): HEPBSAG, HCVAB, HEPAIGM, HEPBIGM in the last 72 hours.  Studies/Results: Dg Chest Port 1 View  10/26/2015  CLINICAL DATA:  Shortness of breath and cough EXAM: PORTABLE CHEST 1 VIEW COMPARISON:  08/07/2015 FINDINGS: No cardiomegaly. Negative aortic  contours accounting for rightward rotation. Improved inflation compared to prior. No effusions seen today. No edema or pneumonia. No pneumothorax. IMPRESSION: Negative portable chest. Electronically Signed   By: Monte Fantasia M.D.   On: 10/26/2015 00:25   Scheduled Meds: . sodium chloride   Intravenous Once  . budesonide  0.25 mg Inhalation BID  . DULoxetine  30 mg Oral Daily   And  . DULoxetine  60 mg Oral QHS  . famotidine  20 mg Oral Daily  . ferric gluconate (FERRLECIT/NULECIT) IV  250 mg Intravenous Q T,Th,Sa-HD  . insulin aspart  0-9 Units Subcutaneous Q4H  . insulin glargine  10 Units Subcutaneous QHS  . levothyroxine  150 mcg Oral QAC breakfast  . pantoprazole (PROTONIX) IV  40 mg Intravenous Q12H  . polyethylene glycol  17 g Oral BID   Continuous Infusions:  PRN Meds:.acetaminophen **OR** acetaminophen, HYDROcodone-acetaminophen, ondansetron **OR** ondansetron (ZOFRAN) IV  ASSESMENT:   *  Hematochezia a/w straining and constipation from narctoics.  Resolved.  10/23/15 flex sig: thrombosed int rrhoids, anal ulcers.  Path pending.   *  ABL on top of chronic ESRD anemia. S/p 4 PRBCs thus far, latest 2 on 3/11. Hgb remains low but stable. On nulecit q TTS during HD.  No epogen type drugs on board. Marland Kitchen   *  ESRD. TTS dialysis since 09/2015.   *  Squamous cell cancer of tongue. DX 05/2015.  S/p left partial glossectomy/neck dissection 10/06/2015, 1/18 left neck nodes positive.   Swallowing and speech improved.       PLAN   *  GI signing off.  Will set up GI ROV on a non HD day.    *  Stop  IV Protonix.    *  Topical preparation H or hydrocortisone containing cream PR for recurrent bleeding.  Miralax, dulcolax or other renal safe laxatives for constipation.     Azucena Freed  10/26/2015, 8:17 AM Pager: [phone]   ________________________________________________________________________  Velora Heckler GI MD note:  I personally examined the patient, reviewed the data and agree  with the assessment and plan described above.  No further overt bleeding, Should use topical rectal therapy as needed (see above).  We will arrange for return visit in our office in 6-8 weeks.  Will also contact her with final path results.  Please call or page with any further questions or concerns.    Owens Loffler, MD [facility] Gastroenterology Pager [phone]

## 2015-10-26 NOTE — Progress Notes (Signed)
[facility]                                                                              Patient Demographics  Tami Elliott, is a 65 y.o. female, DOB - [birth year], [id number]  Admit date - 10/21/2015   Admitting Physician Rise Patience, MD  Outpatient Primary MD for the patient is Harvie Junior, MD  LOS - 5   Chief Complaint  Patient presents with  . GI Bleeding       Brief HPI   65 year old female with ESRD on HD, TTS, diabetes type 2,Presented with 2 episodes of GI bleeding, described as large clots, dark. No nausea, vomiting, abdominal pain or fevers.   Assessment & Plan    Principal Problem:   Acute GI bleeding, Acute blood loss anemia - Continue NPO, gentle hydration, IV PPI - s/p Transfusion 1 unit packed RBCs, Will obtain 2 more during Dialysis today. - GI on board and patient is status post flexible sigmoidoscopy. Blood noted upstream as such current thought is patient may have had bleeding from stercoral ulcers   SVT -Seen and evaluated by cardiology currently on beta blocker if SVT recurs we are to consult EP specialist  Active Problems: ESRD on hemodialysis: TTS - Nephrology managing.    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 sodium chloride    Intravenous Once  . budesonide  0.25 mg Inhalation BID  . DULoxetine  30 mg Oral Daily   And  . DULoxetine  60 mg Oral QHS  . famotidine  20 mg Oral Daily  . ferric gluconate (FERRLECIT/NULECIT) IV  250 mg Intravenous Q T,Th,Sa-HD  . insulin aspart  0-9 Units Subcutaneous Q4H  . insulin glargine  10 Units Subcutaneous QHS  . levothyroxine  150 mcg Oral QAC breakfast  . polyethylene glycol  17 g Oral BID   Continuous Infusions:   PRN Meds:.acetaminophen **OR** acetaminophen, acetaminophen **OR** [DISCONTINUED] acetaminophen, calcium carbonate (dosed in mg elemental calcium), camphor-menthol **AND** hydrOXYzine, docusate sodium, famotidine, feeding supplement (NEPRO CARB STEADY), HYDROcodone-acetaminophen, ondansetron **OR** ondansetron (ZOFRAN) IV, sorbitol, zolpidem   Antibiotics   Anti-infectives    None        Subjective:   Tami Elliott Pt has SVT after argument with person in hospital. She currently has no new complaints.   Objective:   Filed Vitals:   10/26/15 0900 10/26/15 1000 10/26/15 1100 10/26/15 1202  BP: 122/82 120/84 117/99 127/65  Pulse: 84 81 94  86  Temp:    99 F (37.2 C)  TempSrc:    Oral  Resp: 13 17 11 16   Height:      Weight:      SpO2: 100% 100% 82% 100%    Intake/Output Summary (Last 24 hours) at 10/26/15 1559 Last data filed at 10/26/15 0900  Gross per 24 hour  Intake   2360 ml  Output    600 ml  Net   1760 ml     Wt Readings from Last 3 Encounters:  10/26/15 107.8 kg (237 lb 10.5 oz)  08/08/15 113.4 kg (250 lb)  06/29/15 116.121 kg (256 lb)     Exam  General: Alert and oriented x 3, NAD  HEENT:  PERRLA, EOMI  Neck: Supple, no JVD  CVS: S1 S2 auscultated, no rubs, murmurs or gallops. Regular rate and rhythm.  Respiratory: Clear to auscultation bilaterally, no wheezing, rales or rhonchi  Abdomen: Soft, No significant tenderness , nondistended, + bowel sounds  Ext: no cyanosis clubbing or edema  Neuro: AAOx3, no  facial asymmetry  Skin: No rashes  Psych: Normal affect and demeanor   Data Review   Micro Results Recent Results (from the past 240 hour(s))  MRSA PCR Screening     Status: None   Collection Time: 10/22/15  1:08 AM  Result Value Ref Range Status   MRSA by PCR NEGATIVE NEGATIVE Final    Comment:        The GeneXpert MRSA Assay (FDA approved for NASAL specimens only), is one component of a comprehensive MRSA colonization surveillance program. It is not intended to diagnose MRSA infection nor to guide or monitor treatment for MRSA infections.     Radiology Reports Dg Chest Port 1 View  10/26/2015  CLINICAL DATA:  Shortness of breath and cough EXAM: PORTABLE CHEST 1 VIEW COMPARISON:  08/07/2015 FINDINGS: No cardiomegaly. Negative aortic contours accounting for rightward rotation. Improved inflation compared to prior. No effusions seen today. No edema or pneumonia. No pneumothorax. IMPRESSION: Negative portable chest. Electronically Signed   By: Monte Fantasia M.D.   On: 10/26/2015 00:25    CBC  Recent Labs Lab 10/21/15 1930  10/24/15 0209 10/24/15 2315 10/25/15 0726 10/25/15 1943 10/26/15 0105  WBC 12.4*  < > 10.4 12.1* 10.7* 13.2* 12.3*  HGB 10.2*  < > 6.1* 8.7* 8.2* 8.7* 8.2*  HCT 31.5*  < > 18.7* 25.4* 25.4* 27.2* 25.4*  PLT 323  < > 222 226 210 215 216  MCV 94.6  < > 89.5 87.0 87.9 88.0 88.8  MCH 30.6  < > 29.2 29.8 28.4 28.2 28.7  MCHC 32.4  < > 32.6 34.3 32.3 32.0 32.3  RDW 17.7*  < > 19.4* 17.9* 18.0* 18.4* 18.4*  LYMPHSABS 0.9  --   --   --   --   --   --   MONOABS 0.7  --   --   --   --   --   --   EOSABS 0.1  --   --   --   --   --   --   BASOSABS 0.1  --   --   --   --   --   --   < > = values in this interval not displayed.  Chemistries   Recent Labs Lab 10/21/15 1930 10/21/15 1949 10/22/15 0243 10/25/15 1943  NA 138 136 140 137  K 3.7 3.6 3.9 4.2  CL 94* 92* 94* 97*  CO2  28  --  28 26  GLUCOSE 157* 153* 105* 94  BUN 35* 35* 35* 14    CREATININE 6.39* 5.90* 6.63* 4.31*  CALCIUM 8.6*  --  8.5* 8.4*  AST 18  --  12*  --   ALT 8*  --  7*  --   ALKPHOS 85  --  76  --   BILITOT 0.7  --  0.4  --    ------------------------------------------------------------------------------------------------------------------ estimated creatinine clearance is 16 mL/min (by C-G formula based on Cr of 4.31). ------------------------------------------------------------------------------------------------------------------ No results for input(s): HGBA1C in the last 72 hours. ------------------------------------------------------------------------------------------------------------------ No results for input(s): CHOL, HDL, LDLCALC, TRIG, CHOLHDL, LDLDIRECT in the last 72 hours. ------------------------------------------------------------------------------------------------------------------  Recent Labs  10/26/15 0222  TSH 3.242   ------------------------------------------------------------------------------------------------------------------ No results for input(s): VITAMINB12, FOLATE, FERRITIN, TIBC, IRON, RETICCTPCT in the last 72 hours.  Coagulation profile  Recent Labs Lab 10/21/15 1930  INR 1.34    No results for input(s): DDIMER in the last 72 hours.  Cardiac Enzymes  Recent Labs Lab 10/25/15 1943 10/26/15 0105 10/26/15 0815  TROPONINI <0.03 0.09* 0.06*   ------------------------------------------------------------------------------------------------------------------ Invalid input(s): POCBNP   Recent Labs  10/25/15 1527 10/25/15 1940 10/26/15 0103 10/26/15 0445 10/26/15 0822 10/26/15 1201  GLUCAP 84 78 98 76 80 111*     Velvet Bathe M.D. [facility] 10/26/2015, 3:59 PM  Pager: [place]:[phone] Between 7am to 7pm - call Pager - [phone]  After 7pm go to [email or web address] - password [email or web address]  Call night coverage person covering after 7pm

## 2015-10-26 NOTE — Progress Notes (Signed)
Assessment: 1 Gastrointestinal bleed- stercoral ulcers on flex sig.  S/p 4 units PRBCs. Following Hbg, awaiting stabilization.  2 ESRD HD TTS 3 Anemia of CKD/ ABL -loading w IV Fe. 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 5 DM2 on insulin 6 HTN lopressor bid 7 Volume - no vol excess  Plan - HD TTS , no hep w recent GIB: adjust EDW upward  Subjective: Interval History: C/o malaise post HD  Objective: Vital signs in last 24 hours: Temp:  [97.7 F (36.5 C)-99 F (37.2 C)] 98.4 F (36.9 C) (03/13 0821) Pulse Rate:  [73-162] 73 (03/13 0821) Resp:  [8-23] 20 (03/13 0821) BP: (92-134)/(60-88) 121/64 mmHg (03/13 0821) SpO2:  [93 %-100 %] 98 % (03/13 0821) Weight:  [107.8 kg (237 lb 10.5 oz)] 107.8 kg (237 lb 10.5 oz) (03/13 0500) Weight change: 4.3 kg (9 lb 7.7 oz)  Intake/Output from previous day: 03/12 0701 - 03/13 0700 In: 2360 [P.O.:360; IV Piggyback:2000] Out: 600 [Urine:600] Intake/Output this shift: Total I/O In: 360 [P.O.:360] Out: -   General appearance: alert and cooperative Resp: clear to auscultation bilaterally Cardio: regular rate and rhythm, S1, S2 normal, no murmur, click, rub or gallop GI: soft obese Extremities: No CCE  Lab Results:  Recent Labs  10/25/15 1943 10/26/15 0105  WBC 13.2* 12.3*  HGB 8.7* 8.2*  HCT 27.2* 25.4*  PLT 215 216   BMET:  Recent Labs  10/25/15 1943  NA 137  K 4.2  CL 97*  CO2 26  GLUCOSE 94  BUN 14  CREATININE 4.31*  CALCIUM 8.4*   No results for input(s): PTH in the last 72 hours. Iron Studies: No results for input(s): IRON, TIBC, TRANSFERRIN, FERRITIN in the last 72 hours. Studies/Results: Dg Chest Port 1 View  10/26/2015  CLINICAL DATA:  Shortness of breath and cough EXAM: PORTABLE CHEST 1 VIEW COMPARISON:  08/07/2015 FINDINGS: No cardiomegaly. Negative aortic contours accounting for rightward rotation. Improved inflation compared to prior. No effusions seen today.  No edema or pneumonia. No pneumothorax. IMPRESSION: Negative portable chest. Electronically Signed   By: Monte Fantasia M.D.   On: 10/26/2015 00:25    Scheduled: . sodium chloride   Intravenous Once  . budesonide  0.25 mg Inhalation BID  . DULoxetine  30 mg Oral Daily   And  . DULoxetine  60 mg Oral QHS  . famotidine  20 mg Oral Daily  . ferric gluconate (FERRLECIT/NULECIT) IV  250 mg Intravenous Q T,Th,Sa-HD  . insulin aspart  0-9 Units Subcutaneous Q4H  . insulin glargine  10 Units Subcutaneous QHS  . levothyroxine  150 mcg Oral QAC breakfast  . polyethylene glycol  17 g Oral BID     LOS: 5 days   Katoria Yetman C 10/26/2015,11:17 AM

## 2015-10-26 NOTE — Progress Notes (Signed)
   10/26/15 1000  Clinical Encounter Type  Visited With Patient  Visit Type Initial  Referral From Chaplain  Spiritual Encounters  Spiritual Needs Emotional  Stress Factors  Patient Stress Factors Health changes;Lack of caregivers  At request of fellow resident, went to visit this patient who is facing very serious personal health challenges while also dealing with son in serious if not critical condition. Spent approximately 45 minutes with her, primarily listening but also helping he focus on what some support systems might be for her in the future and how she can learn to ask for help from others and relinquish some control. Kasey Hansell, Chaplain

## 2015-10-27 LAB — CBC
HEMATOCRIT: 24.3 % — AB (ref 36.0–46.0)
HEMATOCRIT: 26 % — AB (ref 36.0–46.0)
Hemoglobin: 7.8 g/dL — ABNORMAL LOW (ref 12.0–15.0)
Hemoglobin: 8.6 g/dL — ABNORMAL LOW (ref 12.0–15.0)
MCH: 28.6 pg (ref 26.0–34.0)
MCH: 29.9 pg (ref 26.0–34.0)
MCHC: 32.1 g/dL (ref 30.0–36.0)
MCHC: 33.1 g/dL (ref 30.0–36.0)
MCV: 89 fL (ref 78.0–100.0)
MCV: 90.3 fL (ref 78.0–100.0)
PLATELETS: 247 10*3/uL (ref 150–400)
PLATELETS: 253 10*3/uL (ref 150–400)
RBC: 2.73 MIL/uL — ABNORMAL LOW (ref 3.87–5.11)
RBC: 2.88 MIL/uL — ABNORMAL LOW (ref 3.87–5.11)
RDW: 18.6 % — AB (ref 11.5–15.5)
RDW: 18.4 % — AB (ref 11.5–15.5)
WBC: 8.5 10*3/uL (ref 4.0–10.5)
WBC: 10.6 10*3/uL — AB (ref 4.0–10.5)

## 2015-10-27 LAB — RENAL FUNCTION PANEL
ALBUMIN: 2.4 g/dL — AB (ref 3.5–5.0)
Anion gap: 13 (ref 5–15)
BUN: 22 mg/dL — AB (ref 6–20)
CALCIUM: 8.4 mg/dL — AB (ref 8.9–10.3)
CO2: 24 mmol/L (ref 22–32)
CREATININE: 6.08 mg/dL — AB (ref 0.44–1.00)
Chloride: 103 mmol/L (ref 101–111)
GFR calc Af Amer: 8 mL/min — ABNORMAL LOW (ref >60)
GFR, EST NON AFRICAN AMERICAN: 7 mL/min — AB (ref >60)
GLUCOSE: 115 mg/dL — AB (ref 65–99)
PHOSPHORUS: 5.7 mg/dL — AB (ref 2.5–4.6)
Potassium: 4.6 mmol/L (ref 3.5–5.1)
SODIUM: 140 mmol/L (ref 135–145)

## 2015-10-27 LAB — GLUCOSE, CAPILLARY
GLUCOSE-CAPILLARY: 100 mg/dL — AB (ref 65–99)
GLUCOSE-CAPILLARY: 104 mg/dL — AB (ref 65–99)
GLUCOSE-CAPILLARY: 64 mg/dL — AB (ref 65–99)
GLUCOSE-CAPILLARY: 88 mg/dL (ref 65–99)
GLUCOSE-CAPILLARY: 93 mg/dL (ref 65–99)

## 2015-10-27 MED ORDER — METOPROLOL TARTRATE 25 MG PO TABS
25.0000 mg | ORAL_TABLET | Freq: Two times a day (BID) | ORAL | Status: DC
  Administered 2015-10-27 – 2015-10-29 (×4): 25 mg via ORAL
  Filled 2015-10-27 (×5): qty 1

## 2015-10-27 NOTE — Progress Notes (Addendum)
Hypoglycemic Event  CBG: 64  Treatment: Meal offered  Symptoms: none  Follow-up CBG: 93     TimeCBG Result:  Possible Reasons for Event: NPO, pt in dialysis  Comments/MD notified:

## 2015-10-27 NOTE — Progress Notes (Signed)
Patient's family called about transfer to [address] [place]. Spoke to grandson, Retail buyer.

## 2015-10-27 NOTE — Progress Notes (Signed)
[facility]                                                                              Patient Demographics  Tami Elliott, is a 65 y.o. female, DOB - 06-21-1951, [id number]  Admit date - 10/21/2015   Admitting Physician Rise Patience, MD  Chief Complaint  Patient presents with  . GI Bleeding       Brief HPI   65 year old female with ESRD on HD, TTS, diabetes type 2,Presented with 2 episodes of GI bleeding, described as large clots. FOund to have lower Gi bleed and anemia, 4 units PRBC given, GI consulted, flex sig, hemorroids, suspected to have sterocoral ulcers possibly too. Then had SVT which resolved Improving, Hb down slightly, Tx to floor  Assessment & Plan    Principal Problem:  Lower GI bleeding -felt to be due to straining and constipation from narcotics -appreciate GI consult -10/23/15 flex sig: thrombosed internal hemorrohoids, anal ulcers. Path pending, no diverticuli noted -recommended laxatives and Hydrocort cream topically -no further bleeding today   Acute blood loss anemia - s/p 4 units PRBC this admission -hb trended down again today , 8.7>8.2>7.8 -will repeat again this pm and tomorrow, if trends down further will need another unit   SVT -resolved -Seen and evaluated by cardiology, recommended PRN metoprolol 2.5 mg IV or 12.5 mg PO for SVT -resume Po metoprolol  ESRD on hemodialysis: TTS - Nephrology managing.    Hypothyroidism - Continue Synthroid    Type 2 diabetes mellitus with diabetic neuropathy, with long-term current use of insulin (HCC) - SSI, lantus, CBGs stable    Tongue cancer (HCC) status post recent surgery  Essential hypertension - BP was soft, soft, metoprolol, lasix on hold, resume metoprolol at lower dose  Code Status: full   Family Communication: Discussed with the patient  Disposition Plan: Tx to floor, Home tomorrow if Hb stable/improving  Time Spent in minutes 25 minutes  Procedures    None   Consults   GI Renal   DVT Prophylaxis   SCD's  Medications  Scheduled Meds: . sodium chloride   Intravenous Once  . budesonide  0.25 mg Inhalation BID  . DULoxetine  30 mg Oral Daily   And  . DULoxetine  60 mg Oral QHS  . famotidine  20 mg Oral Daily  . ferric gluconate (FERRLECIT/NULECIT) IV  250 mg Intravenous Q T,Th,Sa-HD  . insulin aspart  0-9 Units Subcutaneous Q4H  . insulin glargine  10 Units Subcutaneous QHS  . levothyroxine  150 mcg Oral QAC breakfast  . polyethylene glycol  17 g Oral BID   Continuous Infusions:   PRN Meds:.acetaminophen **OR** acetaminophen, acetaminophen **OR** [DISCONTINUED] acetaminophen, calcium carbonate (dosed in mg elemental calcium), camphor-menthol **AND** hydrOXYzine, docusate sodium, famotidine, feeding supplement (NEPRO CARB STEADY), HYDROcodone-acetaminophen, ondansetron **OR** ondansetron (ZOFRAN) IV, sorbitol, zolpidem   Antibiotics   Anti-infectives    None        Subjective:   Feels ok, no further bleeding, eating ok   Objective:   Filed Vitals:   10/27/15 0930 10/27/15 0959 10/27/15 1016 10/27/15 1030  BP: 119/69 100/57 116/80 93/65  Pulse: 85 87 88 87  Temp:      TempSrc:      Resp:      Height:      Weight:      SpO2:   99%     Intake/Output Summary (Last 24 hours) at 10/27/15 1050 Last data filed at 10/27/15 0500  Gross per 24 hour  Intake    480 ml  Output   1150 ml  Net   -670 ml     Wt Readings from Last 3 Encounters:  10/27/15 106.7 kg (235 lb 3.7 oz)  08/08/15 113.4 kg (250 lb)  06/29/15 116.121 kg (256 lb)     Exam  General: Alert and oriented x 3, NAD  HEENT:  PERRLA, EOMI  Neck: Supple, no JVD  CVS: S1 S2 auscultated, no rubs, murmurs or gallops. Regular rate and rhythm.  Respiratory: Clear to auscultation bilaterally, no wheezing, rales or rhonchi  Abdomen: Soft, No significant tenderness , nondistended, + bowel sounds  Ext: 1pl;us edema  Neuro: AAOx3, no facial  asymmetry  Skin: No rashes  Psych: Normal affect and demeanor   Data Review   Micro Results Recent Results (from the past 240 hour(s))  MRSA PCR Screening     Status: None   Collection Time: 10/22/15  1:08 AM  Result Value Ref Range Status   MRSA by PCR NEGATIVE NEGATIVE Final    Comment:        The GeneXpert MRSA Assay (FDA approved for NASAL specimens only), is one component of a comprehensive MRSA colonization surveillance program. It is not intended to diagnose MRSA infection nor to guide or monitor treatment for MRSA infections.     Radiology Reports Dg Chest Port 1 View  10/26/2015  CLINICAL DATA:  Shortness of breath and cough EXAM: PORTABLE CHEST 1 VIEW COMPARISON:  08/07/2015 FINDINGS: No cardiomegaly. Negative aortic contours accounting for rightward rotation. Improved inflation compared to prior. No effusions seen today. No edema or pneumonia. No pneumothorax. IMPRESSION: Negative portable chest. Electronically Signed   By: Monte Fantasia M.D.   On: 10/26/2015 00:25    CBC  Recent Labs Lab 10/21/15 1930  10/24/15 2315 10/25/15 0726 10/25/15 1943 10/26/15 0105 10/27/15 0802  WBC 12.4*  < > 12.1* 10.7* 13.2* 12.3* 8.5  HGB 10.2*  < > 8.7* 8.2* 8.7* 8.2* 7.8*  HCT 31.5*  < > 25.4* 25.4* 27.2* 25.4* 24.3*  PLT 323  < > 226 210 215 216 247  MCV 94.6  < > 87.0 87.9 88.0 88.8 89.0  MCH 30.6  < > 29.8 28.4 28.2 28.7 28.6  MCHC 32.4  < > 34.3 32.3 32.0 32.3 32.1  RDW 17.7*  < > 17.9* 18.0* 18.4* 18.4* 18.6*  LYMPHSABS 0.9  --   --   --   --   --   --   MONOABS 0.7  --   --   --   --   --   --   EOSABS 0.1  --   --   --   --   --   --   BASOSABS 0.1  --   --   --   --   --   --   < > = values in this interval not displayed.  Chemistries   Recent Labs Lab 10/21/15 1930 10/21/15 1949 10/22/15 0243 10/25/15 1943 10/27/15 0802  NA 138 136 140 137 140  K 3.7 3.6 3.9 4.2 4.6  CL 94* 92* 94* 97*  103  CO2 28  --  28 26 24   GLUCOSE 157* 153* 105* 94 115*   BUN 35* 35* 35* 14 22*  CREATININE 6.39* 5.90* 6.63* 4.31* 6.08*  CALCIUM 8.6*  --  8.5* 8.4* 8.4*  AST 18  --  12*  --   --   ALT 8*  --  7*  --   --   ALKPHOS 85  --  76  --   --   BILITOT 0.7  --  0.4  --   --    ------------------------------------------------------------------------------------------------------------------ estimated creatinine clearance is 11.3 mL/min (by C-G formula based on Cr of 6.08). ------------------------------------------------------------------------------------------------------------------ No results for input(s): HGBA1C in the last 72 hours. ------------------------------------------------------------------------------------------------------------------ No results for input(s): CHOL, HDL, LDLCALC, TRIG, CHOLHDL, LDLDIRECT in the last 72 hours. ------------------------------------------------------------------------------------------------------------------  Recent Labs  10/26/15 0222  TSH 3.242   ------------------------------------------------------------------------------------------------------------------ No results for input(s): VITAMINB12, FOLATE, FERRITIN, TIBC, IRON, RETICCTPCT in the last 72 hours.  Coagulation profile  Recent Labs Lab 10/21/15 1930  INR 1.34    No results for input(s): DDIMER in the last 72 hours.  Cardiac Enzymes  Recent Labs Lab 10/25/15 1943 10/26/15 0105 10/26/15 0815  TROPONINI <0.03 0.09* 0.06*   ------------------------------------------------------------------------------------------------------------------ Invalid input(s): POCBNP   Recent Labs  10/26/15 0822 10/26/15 1201 10/26/15 1700 10/26/15 2013 10/26/15 2350 10/27/15 0324  GLUCAP 80 111* 87 95 88 100Domenic Polite M.D. [facility] 10/27/2015, 10:50 AM  Pager: [phone]  After 7pm go to [email or web address] - password [email or web address]  Call night coverage person covering after 7pm

## 2015-10-27 NOTE — Progress Notes (Signed)
Pt transferred from [facility], pt oriented to unit, tele monitor placed on pt, bed alarm on, pt eating lunch, pt verbalizes an understanding

## 2015-10-27 NOTE — Procedures (Signed)
She c/o post dialysis malaise, so suspect she needs a raised EDW.  Will limit UF today.  Currently, hemodynamically stable. Weight 106.7 today.  Hgb down to 7.8g today, 8.2 3/13, & 8.7 3/12.   TTS NW 4h 104kg 400/1.5 2/2 bath Heparin 5000 (holding for GIB)  Kacy Conely C

## 2015-10-28 DIAGNOSIS — C029 Malignant neoplasm of tongue, unspecified: Secondary | ICD-10-CM

## 2015-10-28 DIAGNOSIS — E1140 Type 2 diabetes mellitus with diabetic neuropathy, unspecified: Secondary | ICD-10-CM

## 2015-10-28 DIAGNOSIS — Z794 Long term (current) use of insulin: Secondary | ICD-10-CM

## 2015-10-28 LAB — BASIC METABOLIC PANEL
ANION GAP: 13 (ref 5–15)
BUN: 15 mg/dL (ref 6–20)
CALCIUM: 8.1 mg/dL — AB (ref 8.9–10.3)
CO2: 28 mmol/L (ref 22–32)
Chloride: 97 mmol/L — ABNORMAL LOW (ref 101–111)
Creatinine, Ser: 4.36 mg/dL — ABNORMAL HIGH (ref 0.44–1.00)
GFR calc Af Amer: 11 mL/min — ABNORMAL LOW (ref >60)
GFR, EST NON AFRICAN AMERICAN: 10 mL/min — AB (ref >60)
Glucose, Bld: 127 mg/dL — ABNORMAL HIGH (ref 65–99)
POTASSIUM: 4 mmol/L (ref 3.5–5.1)
SODIUM: 138 mmol/L (ref 135–145)

## 2015-10-28 LAB — CBC
HCT: 24.9 % — ABNORMAL LOW (ref 36.0–46.0)
Hemoglobin: 7.8 g/dL — ABNORMAL LOW (ref 12.0–15.0)
MCH: 28.4 pg (ref 26.0–34.0)
MCHC: 31.3 g/dL (ref 30.0–36.0)
MCV: 90.5 fL (ref 78.0–100.0)
Platelets: 286 K/uL (ref 150–400)
RBC: 2.75 MIL/uL — ABNORMAL LOW (ref 3.87–5.11)
RDW: 18.4 % — ABNORMAL HIGH (ref 11.5–15.5)
WBC: 9.4 K/uL (ref 4.0–10.5)

## 2015-10-28 LAB — HEMOGLOBIN AND HEMATOCRIT, BLOOD
HEMATOCRIT: 23 % — AB (ref 36.0–46.0)
Hemoglobin: 7.7 g/dL — ABNORMAL LOW (ref 12.0–15.0)

## 2015-10-28 LAB — GLUCOSE, CAPILLARY
GLUCOSE-CAPILLARY: 85 mg/dL (ref 65–99)
GLUCOSE-CAPILLARY: 94 mg/dL (ref 65–99)
Glucose-Capillary: 123 mg/dL — ABNORMAL HIGH (ref 65–99)
Glucose-Capillary: 137 mg/dL — ABNORMAL HIGH (ref 65–99)
Glucose-Capillary: 79 mg/dL (ref 65–99)
Glucose-Capillary: 99 mg/dL (ref 65–99)

## 2015-10-28 LAB — PREPARE RBC (CROSSMATCH)

## 2015-10-28 MED ORDER — HYDROCORTISONE ACE-PRAMOXINE 1-1 % RE FOAM
1.0000 | Freq: Two times a day (BID) | RECTAL | Status: DC
  Administered 2015-10-28 – 2015-10-30 (×5): 1 via RECTAL
  Filled 2015-10-28: qty 10

## 2015-10-28 MED ORDER — SODIUM CHLORIDE 0.9 % IV SOLN
Freq: Once | INTRAVENOUS | Status: AC
  Administered 2015-10-28: 11:00:00 via INTRAVENOUS

## 2015-10-28 MED ORDER — HYDROCORTISONE 2.5 % RE CREA
TOPICAL_CREAM | Freq: Three times a day (TID) | RECTAL | Status: DC
  Administered 2015-10-28 – 2015-10-29 (×4): via RECTAL
  Administered 2015-10-29: 1 via RECTAL
  Administered 2015-10-30: 10:00:00 via RECTAL
  Filled 2015-10-28: qty 28.35

## 2015-10-28 MED ORDER — WITCH HAZEL-GLYCERIN EX PADS
MEDICATED_PAD | Freq: Four times a day (QID) | CUTANEOUS | Status: DC
  Administered 2015-10-28 – 2015-10-30 (×6): 1 via TOPICAL
  Filled 2015-10-28: qty 100

## 2015-10-28 MED ORDER — DOXERCALCIFEROL 4 MCG/2ML IV SOLN
2.0000 ug | INTRAVENOUS | Status: DC
  Administered 2015-10-29: 2 ug via INTRAVENOUS
  Filled 2015-10-28: qty 2

## 2015-10-28 MED ORDER — SODIUM CHLORIDE 0.9 % IV BOLUS (SEPSIS): 1000.0000 mL | Freq: Once | INTRAVENOUS | Status: DC

## 2015-10-28 MED ORDER — SODIUM CHLORIDE 0.9 % IV BOLUS (SEPSIS)
1000.0000 mL | Freq: Once | INTRAVENOUS | Status: AC
  Administered 2015-10-28: 1000 mL via INTRAVENOUS

## 2015-10-28 MED ORDER — SODIUM CHLORIDE 0.9 % IV SOLN
Freq: Once | INTRAVENOUS | Status: AC
  Administered 2015-10-28: 04:00:00 via INTRAVENOUS

## 2015-10-28 NOTE — Progress Notes (Signed)
[facility] Progress Note  Assessment/Plan: 1. Lower GI bleeding: per primary stercoral ulcers on flex sig. S/p 4 units PRBCs. GI signed off however, pt had episode of bleeding last PM, required 2 more units PRBCs, 500 cc NS.  2. ESRD -TTS at [facility]. New to HD started OP HD 09/08/15. HD tomorrow NO HEPARIN. 3. Anemia - Last HBG 7.7, currently receiving 2nd unit of PRBCs for today. Follow HGB 4. Secondary hyperparathyroidism - cont. TUMs when eating. Last Ca 8.1. Phos 5.7 Recheck renal profile in HD tomorrow.  5. HTN/volume - HD yesterday Net UF 1000 post wt 105.7 at OP EDW-now 107.5 after IVF and blood products. HD tomorrow, attempt UFG 2-3 liters as tolerated Follow volume closely.  6. Nutrition - Albumin 2.4. NPO at present. Renal diet with prostat when able to take POs.  7. 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 Staples out of neck.  8. DM: per primary  Rita H. Brown NP-C 10/28/2015, 12:15 PM  [facility] [phone]  Renal Attending; Persistent and recurrent GIB with need for PRBCs again.  For HD in AM and will avoid heparin. Eros Montour C   Subjective:  "I'm not having a good day". Patient states she had bleeding last night. Currently denies abdominal pain/SOB.  Patient is very stressed as her son in in MICU here "on life support". Emotional support to patient and husband  Objective Filed Vitals:   10/28/15 0809 10/28/15 1002 10/28/15 1035 10/28/15 1051  BP: 99/58 86/57 94/60  129/106  Pulse: 79 90 93 88  Temp:   99.1 F (37.3 C) 98.7 F (37.1 C)  TempSrc:   Oral Oral  Resp:   18 16  Height:      Weight:      SpO2:   100% 100%   Physical Exam General: Chronically ill appearing female, pleasant, NAD. Heart: S1, S2, SR on monitor Lungs: Bilateral breath sounds CTA Abdomen: soft nontender Extremities: No LE edema Dialysis Access: LFA AVF + bruit. Bruised from previous infiltration  Dialysis Orders: [facility] TueThuSat, 4 hrs 0  min, 180NRe Optiflux, BFR 400, DFR Autoflow 1.5, EDW 104 (kg), Dialysate 2.0 K, 2.0 Ca,  UFR Profile: None, Sodium Model: None, Access: LFA AV Fistula Heparin: 5000 units TTS Hectoral: 2 mcg IV per treatment TTS Venofer: 50 mg IV weekly (last dose 10/01/15)  Additional Objective Labs: Basic Metabolic Panel:  Recent Labs Lab 10/25/15 1943 10/27/15 0802 10/28/15 0140  NA 137 140 138  K 4.2 4.6 4.0  CL 97* 103 97*  CO2 26 24 28   GLUCOSE 94 115* 127*  BUN 14 22* 15  CREATININE 4.31* 6.08* 4.36*  CALCIUM 8.4* 8.4* 8.1*  PHOS  --  5.7*  --    Liver Function Tests:  Recent Labs Lab 10/21/15 1930 10/22/15 0243 10/27/15 0802  AST 18 12*  --   ALT 8* 7*  --   ALKPHOS 85 76  --   BILITOT 0.7 0.4  --   PROT 7.1 6.2*  --   ALBUMIN 2.5* 2.3* 2.4*   No results for input(s): LIPASE, AMYLASE in the last 168 hours. CBC:  Recent Labs Lab 10/21/15 1930  10/25/15 1943 10/26/15 0105 10/27/15 0802 10/27/15 1650 10/28/15 0140 10/28/15 0810  WBC 12.4*  < > 13.2* 12.3* 8.5 10.6* 9.4  --   NEUTROABS 10.8*  --   --   --   --   --   --   --   HGB 10.2*  < >  8.7* 8.2* 7.8* 8.6* 7.8* 7.7*  HCT 31.5*  < > 27.2* 25.4* 24.3* 26.0* 24.9* 23.0*  MCV 94.6  < > 88.0 88.8 89.0 90.3 90.5  --   PLT 323  < > 215 216 247 253 286  --   < > = values in this interval not displayed. Blood Culture    Component Value Date/Time   SDES URINE, RANDOM 07/03/2012 1208   SPECREQUEST NONE 07/03/2012 1208   CULT ESCHERICHIA COLI 07/03/2012 1208   REPTSTATUS 07/05/2012 FINAL 07/03/2012 1208    Cardiac Enzymes:  Recent Labs Lab 10/25/15 1943 10/26/15 0105 10/26/15 0815  TROPONINI <0.03 0.09* 0.06*   CBG:  Recent Labs Lab 10/27/15 2106 10/27/15 2356 10/28/15 0401 10/28/15 0757 10/28/15 1203  GLUCAP 104* 99 123* 85 79   Iron Studies: No results for input(s): IRON, TIBC, TRANSFERRIN, FERRITIN in the last 72 hours. @lablastinr3 @ Studies/Results: No results found. Medications:   . sodium  chloride   Intravenous Once  . budesonide  0.25 mg Inhalation BID  . DULoxetine  30 mg Oral Daily   And  . DULoxetine  60 mg Oral QHS  . famotidine  20 mg Oral Daily  . ferric gluconate (FERRLECIT/NULECIT) IV  250 mg Intravenous Q T,Th,Sa-HD  . insulin aspart  0-9 Units Subcutaneous Q4H  . insulin glargine  10 Units Subcutaneous QHS  . levothyroxine  150 mcg Oral QAC breakfast  . metoprolol tartrate  25 mg Oral BID  . polyethylene glycol  17 g Oral BID

## 2015-10-28 NOTE — Progress Notes (Signed)
Pt began passing large amounts of blood and clots from her rectum. BP 98/65, HR 82, Respirations 16, and 100% on room air. Paged Baltazar Najjar, NP. Awaiting orders at this time.

## 2015-10-28 NOTE — Progress Notes (Signed)
Shift summary: Pt had GIB here during stay but had not had bleeding any further. GI signed off on 10/26/15. However, last night 10/27/15, RN paged because pt had a large amount of BRBPR. BP in low 90s. 1L NS bolus given and one unit PRBCs ordered. Stat Hgb 7.8. BP has come up nicely since above. Per RN, has had small amount of bleeding during the night. Transfusion is finishing around 7am. Stat H/H 1.5 hours after that.  Clance Boll, NP [facility]

## 2015-10-28 NOTE — Progress Notes (Signed)
Quick Note:  Results given at hospital Benign ulcers No letter/recall ______

## 2015-10-28 NOTE — Progress Notes (Signed)
Daily Rounding Note  10/28/2015, 1:25 PM  LOS: 7 days   SUBJECTIVE:       Recurrent bleeding per rectum.   Flex sig 3/10 for same anal and distal rectal ulcers, internal and external rrhoids with thrombosis of external hemorrhoids. Path on ulcers: benign acute and chronically inflamed squamous and colorectal mucosa with ulceration: findings non-specific.  Hgb oscillating: 8.2, 7.8, 8.6, 7.8 yesterday: 1 unit PRBCs , 7.7 today: 1 more unit infusing now.   Initial hematochezia a/w constipation and straining in setting of narcotics.  Miralax added BID, but pt only accepting once daily dosing, refused todays AM dose. Pt had brown BMs earlier this week but recurrent larger volume hematochezia with clots of blood 3/14 x 3 or 4 episodes and 2 episodes this AM.  Some dizziness this AM.  No rectal or abdominal pain.    It is because of hematochezia that she is refusing the Miralax.  Pt also developed significant left arm hematoma over last few days following traumatic HD needle access to graft.   Scheduled Meds: . sodium chloride   Intravenous Once  . budesonide  0.25 mg Inhalation BID  . [START ON 10/29/2015] doxercalciferol  2 mcg Intravenous Q T,Th,Sa-HD  . DULoxetine  30 mg Oral Daily   And  . DULoxetine  60 mg Oral QHS  . famotidine  20 mg Oral Daily  . ferric gluconate (FERRLECIT/NULECIT) IV  250 mg Intravenous Q T,Th,Sa-HD  . insulin aspart  0-9 Units Subcutaneous Q4H  . insulin glargine  10 Units Subcutaneous QHS  . levothyroxine  150 mcg Oral QAC breakfast  . metoprolol tartrate  25 mg Oral BID  . polyethylene glycol  17 g Oral BID   Continuous Infusions:  PRN Meds:.acetaminophen **OR** acetaminophen, acetaminophen **OR** [DISCONTINUED] acetaminophen, calcium carbonate (dosed in mg elemental calcium), camphor-menthol **AND** hydrOXYzine, docusate sodium, famotidine, feeding supplement (NEPRO CARB STEADY),  HYDROcodone-acetaminophen, ondansetron **OR** ondansetron (ZOFRAN) IV, sorbitol, zolpidem  OBJECTIVE:         Vital signs in last 24 hours:    Temp:  [97.6 F (36.4 C)-99.9 F (37.7 C)] 98.7 F (37.1 C) (03/15 1051) Pulse Rate:  [76-103] 88 (03/15 1051) Resp:  [14-20] 16 (03/15 1051) BP: (86-129)/(46-106) 129/106 mmHg (03/15 1051) SpO2:  [90 %-100 %] 100 % (03/15 1051) Weight:  [107.9 kg (237 lb 14 oz)] 107.9 kg (237 lb 14 oz) (03/15 0558) Last BM Date: 10/27/15 Filed Weights   10/27/15 0735 10/27/15 1116 10/28/15 0558  Weight: 106.7 kg (235 lb 3.7 oz) 105.7 kg (233 lb 0.4 oz) 107.9 kg (237 lb 14 oz)   General: ill looking WF.  Comfortable, NAD   Heart: RRR Chest: clear bil.  No cough or dyspnea Abdomen: obese, soft, NT, minor/small hematoma right lower abdomen  Extremities: large bruise left UE emanating from left antecubital region.  Neuro/Psych:  Cooperative, calm, moves all 4s.  Fully oriented and alert.  Rectal:  No visible red blood.  Moderately engorged external hemorrhoid with visible surface ulcer.   Intake/Output from previous day: 03/14 0701 - 03/15 0700 In: 586 [P.O.:290; Blood:296] Out: 1402 [Urine:400; Stool:1]  Intake/Output this shift:    Lab Results:  Recent Labs  10/27/15 0802 10/27/15 1650 10/28/15 0140 10/28/15 0810  WBC 8.5 10.6* 9.4  --   HGB 7.8* 8.6* 7.8* 7.7*  HCT 24.3* 26.0* 24.9* 23.0*  PLT 247 253 286  --    BMET  Recent Labs  10/25/15 1943  10/27/15 0802 10/28/15 0140  NA 137 140 138  K 4.2 4.6 4.0  CL 97* 103 97*  CO2 26 24 28   GLUCOSE 94 115* 127*  BUN 14 22* 15  CREATININE 4.31* 6.08* 4.36*  CALCIUM 8.4* 8.4* 8.1*   LFT  Recent Labs  10/27/15 0802  ALBUMIN 2.4*   PT/INR No results for input(s): LABPROT, INR in the last 72 hours. Hepatitis Panel No results for input(s): HEPBSAG, HCVAB, HEPAIGM, HEPBIGM in the last 72 hours.  Studies/Results: No results found.  ASSESMENT:   * Hematochezia a/w straining and  constipation from narctoics. Resolved.  10/23/15 flex sig: thrombosed int rrhoids, anal ulcers. Path pending.   * ABL on top of chronic ESRD anemia. S/p 5 PRBCs thus far, 6th unit currently infusing. . On nulecit q TTS during HD. No epogen type drugs on board. .   * ESRD. TTS dialysis since 09/2015. The bruise in left UE near her HD access is accounting for some of the Hgb decrease.   * Squamous cell cancer of tongue. DX 05/2015. S/p left partial glossectomy/neck dissection 10/06/2015, 1/18 left neck nodes positive. Swallowing and speech improved.     PLAN   *  Adding Proctofoam, Anusol HC cream, tucks pads. .    *  No need to keep her NPO: ordered renal diet.   *  Discontinued order for Enemeez (docusate) enema in order to minimize potential for disrupting rectal ulcers.  In any case she was not using this.     Azucena Freed  10/28/2015, 1:25 PM Pager: [phone]   ________________________________________________________________________  Velora Heckler GI MD note:  I personally examined the patient, reviewed the data and agree with the assessment and plan described above.   Owens Loffler, MD [facility] Pager [phone]

## 2015-10-28 NOTE — Progress Notes (Addendum)
[facility]                                                                              Patient Demographics  Tami Elliott, is a 65 y.o. female, DOB - 1951/06/06, [id number]  Admit date - 10/21/2015   Admitting Physician Rise Patience, MD  Chief Complaint  Patient presents with  . GI Bleeding    Subjective:  Had bleeding last night and 2 bloody bowel movement this morning. Recent flexible sigmoidoscopy showed rectal ulcers, will let GI note.  Brief HPI   65 year old female with ESRD on HD, TTS, diabetes type 2,Presented with 2 episodes of GI bleeding, described as large clots. FOund to have lower Gi bleed and anemia, 4 units PRBC given, GI consulted, flex sig, hemorroids, suspected to have sterocoral ulcers possibly too. Then had SVT which resolved Improving, Hb down slightly, Tx to floor  Assessment & Plan    Lower GI bleeding -felt to be due to Stercoral ulcers from constipation secondary to narcotics -appreciate GI consult -10/23/15 flex sig: thrombosed internal hemorrohoids, anal ulcers. Path pending, no diverticuli noted -recommended laxatives and Hydrocort cream topically -Had bleeding last night, GI notified. Will transfuse.   Acute blood loss anemia - s/p 4 units PRBC this admission -Hemoglobin went down to 7.8, after transfusion of 1 unit only recovered to 7.7. -Had transfusion overnight, will transfuse 1 more unit (total of 2 units)  SVT -resolved -Seen and evaluated by cardiology, recommended PRN metoprolol 2.5 mg IV or 12.5 mg PO for SVT -resume Po metoprolol  ESRD on hemodialysis: TTS - Nephrology managing.    Hypothyroidism - Continue Synthroid    Type 2 diabetes mellitus with diabetic neuropathy, with long-term current use of insulin (HCC) - SSI, lantus, CBGs stable    Tongue cancer (HCC)  status post recent surgery  Essential hypertension - BP was soft, soft, metoprolol, lasix on hold, resume metoprolol at lower  dose  Code Status: full   Family Communication: Discussed with the patient  Disposition Plan: Tx to floor, Home tomorrow if Hb stable/improving  Time Spent in minutes 25 minutes  Procedures  None   Consults   GI Renal   DVT Prophylaxis   SCD's  Medications  Scheduled Meds: . sodium chloride   Intravenous Once  . budesonide  0.25 mg Inhalation BID  . [START ON 10/29/2015] doxercalciferol  2 mcg Intravenous Q T,Th,Sa-HD  . DULoxetine  30 mg Oral Daily   And  . DULoxetine  60 mg Oral QHS  . famotidine  20 mg Oral Daily  . ferric gluconate (FERRLECIT/NULECIT) IV  250 mg Intravenous Q T,Th,Sa-HD  . insulin aspart  0-9 Units Subcutaneous Q4H  . insulin glargine  10 Units Subcutaneous QHS  . levothyroxine  150 mcg Oral QAC breakfast  . metoprolol tartrate  25 mg Oral BID  . polyethylene glycol  17 g Oral BID   Continuous Infusions:   PRN Meds:.acetaminophen **OR** acetaminophen, acetaminophen **OR** [DISCONTINUED] acetaminophen, calcium carbonate (dosed in mg elemental calcium), camphor-menthol **AND** hydrOXYzine, docusate sodium, famotidine, feeding supplement (NEPRO CARB STEADY), HYDROcodone-acetaminophen, ondansetron **OR** ondansetron (ZOFRAN) IV, sorbitol, zolpidem   Antibiotics  Anti-infectives    None         Objective:   Filed Vitals:   10/28/15 0809 10/28/15 1002 10/28/15 1035 10/28/15 1051  BP: 99/58 86/57 94/60  129/106  Pulse: 79 90 93 88  Temp:   99.1 F (37.3 C) 98.7 F (37.1 C)  TempSrc:   Oral Oral  Resp:   18 16  Height:      Weight:      SpO2:   100% 100%    Intake/Output Summary (Last 24 hours) at 10/28/15 1255 Last data filed at 10/28/15 0650  Gross per 24 hour  Intake    296 ml  Output    402 ml  Net   -106 ml     Wt Readings from Last 3 Encounters:  10/28/15 107.9 kg (237 lb 14 oz)  08/08/15 113.4 kg (250 lb)  06/29/15 116.121 kg (256 lb)     Exam  General: Alert and oriented x 3, NAD  HEENT:  PERRLA, EOMI  Neck:  Supple, no JVD  CVS: S1 S2 auscultated, no rubs, murmurs or gallops. Regular rate and rhythm.  Respiratory: Clear to auscultation bilaterally, no wheezing, rales or rhonchi  Abdomen: Soft, No significant tenderness , nondistended, + bowel sounds  Ext: 1pl;us edema  Neuro: AAOx3, no facial asymmetry  Skin: No rashes  Psych: Normal affect and demeanor   Data Review   Micro Results Recent Results (from the past 240 hour(s))  MRSA PCR Screening     Status: None   Collection Time: 10/22/15  1:08 AM  Result Value Ref Range Status   MRSA by PCR NEGATIVE NEGATIVE Final    Comment:        The GeneXpert MRSA Assay (FDA approved for NASAL specimens only), is one component of a comprehensive MRSA colonization surveillance program. It is not intended to diagnose MRSA infection nor to guide or monitor treatment for MRSA infections.     Radiology Reports Dg Chest Port 1 View  10/26/2015  CLINICAL DATA:  Shortness of breath and cough EXAM: PORTABLE CHEST 1 VIEW COMPARISON:  08/07/2015 FINDINGS: No cardiomegaly. Negative aortic contours accounting for rightward rotation. Improved inflation compared to prior. No effusions seen today. No edema or pneumonia. No pneumothorax. IMPRESSION: Negative portable chest. Electronically Signed   By: Monte Fantasia M.D.   On: 10/26/2015 00:25    CBC  Recent Labs Lab 10/21/15 1930  10/25/15 1943 10/26/15 0105 10/27/15 0802 10/27/15 1650 10/28/15 0140 10/28/15 0810  WBC 12.4*  < > 13.2* 12.3* 8.5 10.6* 9.4  --   HGB 10.2*  < > 8.7* 8.2* 7.8* 8.6* 7.8* 7.7*  HCT 31.5*  < > 27.2* 25.4* 24.3* 26.0* 24.9* 23.0*  PLT 323  < > 215 216 247 253 286  --   MCV 94.6  < > 88.0 88.8 89.0 90.3 90.5  --   MCH 30.6  < > 28.2 28.7 28.6 29.9 28.4  --   MCHC 32.4  < > 32.0 32.3 32.1 33.1 31.3  --   RDW 17.7*  < > 18.4* 18.4* 18.6* 18.4* 18.4*  --   LYMPHSABS 0.9  --   --   --   --   --   --   --   MONOABS 0.7  --   --   --   --   --   --   --   EOSABS  0.1  --   --   --   --   --   --   --  BASOSABS 0.1  --   --   --   --   --   --   --   < > = values in this interval not displayed.  Chemistries   Recent Labs Lab 10/21/15 1930 10/21/15 1949 10/22/15 0243 10/25/15 1943 10/27/15 0802 10/28/15 0140  NA 138 136 140 137 140 138  K 3.7 3.6 3.9 4.2 4.6 4.0  CL 94* 92* 94* 97* 103 97*  CO2 28  --  28 26 24 28   GLUCOSE 157* 153* 105* 94 115* 127*  BUN 35* 35* 35* 14 22* 15  CREATININE 6.39* 5.90* 6.63* 4.31* 6.08* 4.36*  CALCIUM 8.6*  --  8.5* 8.4* 8.4* 8.1*  AST 18  --  12*  --   --   --   ALT 8*  --  7*  --   --   --   ALKPHOS 85  --  76  --   --   --   BILITOT 0.7  --  0.4  --   --   --    ------------------------------------------------------------------------------------------------------------------ estimated creatinine clearance is 15.8 mL/min (by C-G formula based on Cr of 4.36). ------------------------------------------------------------------------------------------------------------------ No results for input(s): HGBA1C in the last 72 hours. ------------------------------------------------------------------------------------------------------------------ No results for input(s): CHOL, HDL, LDLCALC, TRIG, CHOLHDL, LDLDIRECT in the last 72 hours. ------------------------------------------------------------------------------------------------------------------  Recent Labs  10/26/15 0222  TSH 3.242   ------------------------------------------------------------------------------------------------------------------ No results for input(s): VITAMINB12, FOLATE, FERRITIN, TIBC, IRON, RETICCTPCT in the last 72 hours.  Coagulation profile  Recent Labs Lab 10/21/15 1930  INR 1.34    No results for input(s): DDIMER in the last 72 hours.  Cardiac Enzymes  Recent Labs Lab 10/25/15 1943 10/26/15 0105 10/26/15 0815  TROPONINI <0.03 0.09* 0.06*    ------------------------------------------------------------------------------------------------------------------ Invalid input(s): POCBNP   Recent Labs  10/27/15 1521 10/27/15 2106 10/27/15 2356 10/28/15 0401 10/28/15 0757 10/28/15 1203  GLUCAP 93 104* 99 123* 85 66     Jonus Coble A M.D. [facility] 10/28/2015, 12:55 PM  Pager: [phone]  After 7pm go to [email or web address] - password [email or web address]  Call night coverage person covering after 7pm

## 2015-10-28 NOTE — Care Management Important Message (Signed)
Important Message  Patient Details  Name: Tami Elliott MRN: [id number] Date of Birth: Jan 16, 1951   Medicare Important Message Given:  Yes    Nathen May 10/28/2015, 9:57 AM

## 2015-10-29 DIAGNOSIS — E039 Hypothyroidism, unspecified: Secondary | ICD-10-CM

## 2015-10-29 LAB — CBC
HCT: 22.9 % — ABNORMAL LOW (ref 36.0–46.0)
Hemoglobin: 7.7 g/dL — ABNORMAL LOW (ref 12.0–15.0)
MCH: 29.8 pg (ref 26.0–34.0)
MCHC: 33.6 g/dL (ref 30.0–36.0)
MCV: 88.8 fL (ref 78.0–100.0)
PLATELETS: 289 10*3/uL (ref 150–400)
RBC: 2.58 MIL/uL — ABNORMAL LOW (ref 3.87–5.11)
RDW: 17.7 % — AB (ref 11.5–15.5)
WBC: 9.7 10*3/uL (ref 4.0–10.5)

## 2015-10-29 LAB — RENAL FUNCTION PANEL
Albumin: 2.3 g/dL — ABNORMAL LOW (ref 3.5–5.0)
Anion gap: 10 (ref 5–15)
BUN: 27 mg/dL — ABNORMAL HIGH (ref 6–20)
CO2: 27 mmol/L (ref 22–32)
Calcium: 8.1 mg/dL — ABNORMAL LOW (ref 8.9–10.3)
Chloride: 103 mmol/L (ref 101–111)
Creatinine, Ser: 5.95 mg/dL — ABNORMAL HIGH (ref 0.44–1.00)
GFR calc Af Amer: 8 mL/min — ABNORMAL LOW (ref >60)
GFR calc non Af Amer: 7 mL/min — ABNORMAL LOW (ref >60)
Glucose, Bld: 108 mg/dL — ABNORMAL HIGH (ref 65–99)
Phosphorus: 5.6 mg/dL — ABNORMAL HIGH (ref 2.5–4.6)
Potassium: 4.4 mmol/L (ref 3.5–5.1)
Sodium: 140 mmol/L (ref 135–145)

## 2015-10-29 LAB — GLUCOSE, CAPILLARY
GLUCOSE-CAPILLARY: 104 mg/dL — AB (ref 65–99)
Glucose-Capillary: 110 mg/dL — ABNORMAL HIGH (ref 65–99)
Glucose-Capillary: 82 mg/dL (ref 65–99)
Glucose-Capillary: 85 mg/dL (ref 65–99)
Glucose-Capillary: 88 mg/dL (ref 65–99)
Glucose-Capillary: 89 mg/dL (ref 65–99)

## 2015-10-29 LAB — PREPARE RBC (CROSSMATCH)

## 2015-10-29 MED ORDER — SODIUM CHLORIDE 0.9 % IV SOLN: 100.0000 mL | INTRAVENOUS | Status: DC | PRN

## 2015-10-29 MED ORDER — LIDOCAINE HCL (PF) 1 % IJ SOLN
5.0000 mL | INTRAMUSCULAR | Status: DC | PRN
  Filled 2015-10-29: qty 5

## 2015-10-29 MED ORDER — LIDOCAINE-PRILOCAINE 2.5-2.5 % EX CREA
1.0000 "application " | TOPICAL_CREAM | CUTANEOUS | Status: DC | PRN
  Filled 2015-10-29: qty 5

## 2015-10-29 MED ORDER — DOXERCALCIFEROL 4 MCG/2ML IV SOLN
INTRAVENOUS | Status: AC
  Filled 2015-10-29: qty 2

## 2015-10-29 MED ORDER — PENTAFLUOROPROP-TETRAFLUOROETH EX AERO: 1.0000 "application " | INHALATION_SPRAY | CUTANEOUS | Status: DC | PRN

## 2015-10-29 MED ORDER — SODIUM CHLORIDE 0.9 % IV SOLN: Freq: Once | INTRAVENOUS | Status: DC

## 2015-10-29 NOTE — Progress Notes (Signed)
Daily Rounding Note  10/29/2015, 8:41 AM  LOS: 8 days   SUBJECTIVE:       Small volume of blood last night, stool brown.   OBJECTIVE:         Vital signs in last 24 hours:    Temp:  [97.6 F (36.4 C)-99.6 F (37.6 C)] 99.4 F (37.4 C) (03/16 0710) Pulse Rate:  [72-93] 86 (03/16 0830) Resp:  [16-20] 18 (03/16 0710) BP: (86-129)/(57-106) 116/57 mmHg (03/16 0830) SpO2:  [95 %-100 %] 98 % (03/16 0710) Weight:  [102.8 kg (226 lb 10.1 oz)-104.1 kg (229 lb 8 oz)] 102.8 kg (226 lb 10.1 oz) (03/16 0710) Last BM Date: 10/28/15 Filed Weights   10/28/15 0558 10/29/15 0516 10/29/15 0710  Weight: 107.9 kg (237 lb 14 oz) 104.1 kg (229 lb 8 oz) 102.8 kg (226 lb 10.1 oz)   General: seen during HD session.  Looks chronically ill.    Heart: RRR Chest: clear bil.  No labored breathing Abdomen: soft, NT, ND.  Active BS  Extremities: no CCE Neuro/Psych:  Oriented x 3.  Alert, NAD.  Moves all 4s, no gross deficits.   Intake/Output from previous day: 03/15 0701 - 03/16 0700 In: 120 [P.O.:120] Out: 100 [Urine:100]  Intake/Output this shift:    Lab Results:  Recent Labs  10/27/15 1650 10/28/15 0140 10/28/15 0810 10/29/15 0626  WBC 10.6* 9.4  --  9.7  HGB 8.6* 7.8* 7.7* 7.7*  HCT 26.0* 24.9* 23.0* 22.9*  PLT 253 286  --  289   BMET  Recent Labs  10/27/15 0802 10/28/15 0140 10/29/15 0626  NA 140 138 140  K 4.6 4.0 4.4  CL 103 97* 103  CO2 24 28 27   GLUCOSE 115* 127* 108*  BUN 22* 15 27*  CREATININE 6.08* 4.36* 5.95*  CALCIUM 8.4* 8.1* 8.1*   LFT  Recent Labs  10/27/15 0802 10/29/15 0626  ALBUMIN 2.4* 2.3*   PT/INR No results for input(s): LABPROT, INR in the last 72 hours. Hepatitis Panel No results for input(s): HEPBSAG, HCVAB, HEPAIGM, HEPBIGM in the last 72 hours.  Studies/Results: No results found.   Scheduled Meds: . sodium chloride   Intravenous Once  . budesonide  0.25 mg Inhalation BID  .  doxercalciferol  2 mcg Intravenous Q T,Th,Sa-HD  . DULoxetine  30 mg Oral Daily   And  . DULoxetine  60 mg Oral QHS  . famotidine  20 mg Oral Daily  . ferric gluconate (FERRLECIT/NULECIT) IV  250 mg Intravenous Q T,Th,Sa-HD  . hydrocortisone   Rectal TID  . hydrocortisone-pramoxine  1 applicator Rectal BID  . insulin aspart  0-9 Units Subcutaneous Q4H  . insulin glargine  10 Units Subcutaneous QHS  . levothyroxine  150 mcg Oral QAC breakfast  . metoprolol tartrate  25 mg Oral BID  . polyethylene glycol  17 g Oral BID  . witch hazel-glycerin   Topical QID   Continuous Infusions:  PRN Meds:.sodium chloride, sodium chloride, acetaminophen **OR** acetaminophen, acetaminophen **OR** [DISCONTINUED] acetaminophen, calcium carbonate (dosed in mg elemental calcium), camphor-menthol **AND** hydrOXYzine, feeding supplement (NEPRO CARB STEADY), HYDROcodone-acetaminophen, lidocaine (PF), lidocaine-prilocaine, ondansetron **OR** ondansetron (ZOFRAN) IV, pentafluoroprop-tetrafluoroeth, sorbitol, zolpidem   ASSESMENT:   * Hematochezia a/w straining and constipation from narctoics.  10/23/15 flex sig: thrombosed int rrhoids, anal ulcers.Benign pathology.  Suspect stercoral ulcers.   * ABL on top of chronic ESRD anemia. S/p 5 PRBCs thus far, 6th unit currently infusing.  On nulecit  q TTS during HD. No epogen type drugs on board. .   * ESRD. TTS dialysis since 09/2015. The bruise in left UE near her HD access is accounting for some of the Hgb decrease.   * Squamous cell cancer of tongue. DX 05/2015. S/p left partial glossectomy/neck dissection 10/06/2015, 1/18 left neck nodes positive. Swallowing and speech improved.    PLAN   *  Continue proctofoam and external topical Anusol HC, tucks pads for another week.  Use miralax or stool softeners to maintain regular, soft BMs.   *  GI signing off.  Available prn.     Azucena Freed  10/29/2015, 8:41 AM Pager:  [phone]  ________________________________________________________________________  Velora Heckler GI MD note:  I personally examined the patient, reviewed the data and agree with the assessment and plan described above.   Owens Loffler, MD [facility] Pager [phone]

## 2015-10-29 NOTE — Procedures (Signed)
Hgb 7.7 today and stable, K 4.48meq/l Hemodynamically stable. Goal of 3 ,liters too high, will reduce goal as BP low. Will also give PRBCs  TueThuSat, 4 hrs 0 min, 180NRe Optiflux, BFR 400, DFR Autoflow 1.5, EDW 104 (kg), Dialysate 2.0 K, 2.0 Ca   Tami Elliott C

## 2015-10-29 NOTE — Progress Notes (Signed)
[facility]                                                                              Patient Demographics  Tami Elliott, is a 65 y.o. female, DOB - 1951/07/08, [id number]  Admit date - 10/21/2015   Admitting Physician Rise Patience, MD  Chief Complaint  Patient presents with  . GI Bleeding    Subjective:  Formed bowel movement today with less blood than yesterday. Hemoglobin is still 7.7 after transfusion of 2 units. Per nephrology 1 unit to be transfused today.  Brief HPI   65 year old female with ESRD on HD, TTS, diabetes type 2,Presented with 2 episodes of GI bleeding, described as large clots. FOund to have lower Gi bleed and anemia, 4 units PRBC given, GI consulted, flex sig, hemorroids, suspected to have sterocoral ulcers possibly too. Then had SVT which resolved Improving, Hb down slightly, Tx to floor  Assessment & Plan    Lower GI bleeding -felt to be due to Stercoral ulcers from constipation secondary to narcotics -appreciate GI consult -10/23/15 flex sig: thrombosed internal hemorrohoids, anal ulcers. Path pending, no diverticuli noted -recommended laxatives and Hydrocort cream topically -Brown stools with some blood, likely leading stopped.   Acute blood loss anemia - s/p 7 units PRBC this admission -Hemoglobin at 7.7. -Transfused 2 units with dialysis  SVT -resolved -Seen and evaluated by cardiology, recommended PRN metoprolol 2.5 mg IV or 12.5 mg PO for SVT -resume Po metoprolol  ESRD on hemodialysis: TTS - Nephrology managing.    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   Code Status: full   Family Communication: Discussed with the  patient  Disposition Plan: Tx to floor, Home tomorrow if Hb stable/improving  Time Spent in minutes 25 minutes  Procedures  None   Consults   GI Renal   DVT Prophylaxis   SCD's  Medications  Scheduled Meds: . sodium chloride   Intravenous Once  . sodium chloride   Intravenous Once  . budesonide  0.25 mg Inhalation BID  . doxercalciferol      . doxercalciferol  2 mcg Intravenous Q T,Th,Sa-HD  . DULoxetine  30 mg Oral Daily   And  . DULoxetine  60 mg Oral QHS  . famotidine  20 mg Oral Daily  . ferric gluconate (FERRLECIT/NULECIT) IV  250 mg Intravenous Q T,Th,Sa-HD  . hydrocortisone   Rectal TID  . hydrocortisone-pramoxine  1 applicator Rectal BID  . insulin aspart  0-9 Units Subcutaneous Q4H  . insulin glargine  10 Units Subcutaneous QHS  . levothyroxine  150 mcg Oral QAC breakfast  . metoprolol tartrate  25 mg Oral BID  . polyethylene glycol  17 g Oral BID  . witch hazel-glycerin   Topical QID   Continuous Infusions:   PRN Meds:.acetaminophen **OR** acetaminophen, acetaminophen **OR** [DISCONTINUED] acetaminophen, calcium carbonate (dosed in mg elemental  calcium), camphor-menthol **AND** hydrOXYzine, feeding supplement (NEPRO CARB STEADY), HYDROcodone-acetaminophen, ondansetron **OR** ondansetron (ZOFRAN) IV, sorbitol, zolpidem   Antibiotics   Anti-infectives    None         Objective:   Filed Vitals:   10/29/15 1114 10/29/15 1127 10/29/15 1150 10/29/15 1250  BP: 112/62 117/54 101/62 119/80  Pulse: 84 84 82 94  Temp: 98.1 F (36.7 C) 98.6 F (37 C) 98.4 F (36.9 C)   TempSrc: Oral Oral Oral   Resp:  23 22   Height:      Weight:   101 kg (222 lb 10.6 oz)   SpO2:   98% 100%    Intake/Output Summary (Last 24 hours) at 10/29/15 1255 Last data filed at 10/29/15 1230  Gross per 24 hour  Intake    455 ml  Output   1002 ml  Net   -547 ml     Wt Readings from Last 3 Encounters:  10/29/15 101 kg (222 lb 10.6 oz)  08/08/15 113.4 kg (250 lb)    06/29/15 116.121 kg (256 lb)     Exam  General: Alert and oriented x 3, NAD  HEENT:  PERRLA, EOMI  Neck: Supple, no JVD  CVS: S1 S2 auscultated, no rubs, murmurs or gallops. Regular rate and rhythm.  Respiratory: Clear to auscultation bilaterally, no wheezing, rales or rhonchi  Abdomen: Soft, No significant tenderness , nondistended, + bowel sounds  Ext: 1pl;us edema  Neuro: AAOx3, no facial asymmetry  Skin: No rashes  Psych: Normal affect and demeanor   Data Review   Micro Results Recent Results (from the past 240 hour(s))  MRSA PCR Screening     Status: None   Collection Time: 10/22/15  1:08 AM  Result Value Ref Range Status   MRSA by PCR NEGATIVE NEGATIVE Final    Comment:        The GeneXpert MRSA Assay (FDA approved for NASAL specimens only), is one component of a comprehensive MRSA colonization surveillance program. It is not intended to diagnose MRSA infection nor to guide or monitor treatment for MRSA infections.     Radiology Reports Dg Chest Port 1 View  10/26/2015  CLINICAL DATA:  Shortness of breath and cough EXAM: PORTABLE CHEST 1 VIEW COMPARISON:  08/07/2015 FINDINGS: No cardiomegaly. Negative aortic contours accounting for rightward rotation. Improved inflation compared to prior. No effusions seen today. No edema or pneumonia. No pneumothorax. IMPRESSION: Negative portable chest. Electronically Signed   By: Monte Fantasia M.D.   On: 10/26/2015 00:25    CBC  Recent Labs Lab 10/26/15 0105 10/27/15 0802 10/27/15 1650 10/28/15 0140 10/28/15 0810 10/29/15 0626  WBC 12.3* 8.5 10.6* 9.4  --  9.7  HGB 8.2* 7.8* 8.6* 7.8* 7.7* 7.7*  HCT 25.4* 24.3* 26.0* 24.9* 23.0* 22.9*  PLT 216 247 253 286  --  289  MCV 88.8 89.0 90.3 90.5  --  88.8  MCH 28.7 28.6 29.9 28.4  --  29.8  MCHC 32.3 32.1 33.1 31.3  --  33.6  RDW 18.4* 18.6* 18.4* 18.4*  --  17.7*    Chemistries   Recent Labs Lab 10/25/15 1943 10/27/15 0802 10/28/15 0140  10/29/15 0626  NA 137 140 138 140  K 4.2 4.6 4.0 4.4  CL 97* 103 97* 103  CO2 26 24 28 27   GLUCOSE 94 115* 127* 108*  BUN 14 22* 15 27*  CREATININE 4.31* 6.08* 4.36* 5.95*  CALCIUM 8.4* 8.4* 8.1* 8.1*   ------------------------------------------------------------------------------------------------------------------ estimated creatinine clearance  is 11.2 mL/min (by C-G formula based on Cr of 5.95). ------------------------------------------------------------------------------------------------------------------ No results for input(s): HGBA1C in the last 72 hours. ------------------------------------------------------------------------------------------------------------------ No results for input(s): CHOL, HDL, LDLCALC, TRIG, CHOLHDL, LDLDIRECT in the last 72 hours. ------------------------------------------------------------------------------------------------------------------ No results for input(s): TSH, T4TOTAL, T3FREE, THYROIDAB in the last 72 hours.  Invalid input(s): FREET3 ------------------------------------------------------------------------------------------------------------------ No results for input(s): VITAMINB12, FOLATE, FERRITIN, TIBC, IRON, RETICCTPCT in the last 72 hours.  Coagulation profile No results for input(s): INR, PROTIME in the last 168 hours.  No results for input(s): DDIMER in the last 72 hours.  Cardiac Enzymes  Recent Labs Lab 10/25/15 1943 10/26/15 0105 10/26/15 0815  TROPONINI <0.03 0.09* 0.06*   ------------------------------------------------------------------------------------------------------------------ Invalid input(s): POCBNP   Recent Labs  10/28/15 1203 10/28/15 1705 10/28/15 2105 10/28/15 2340 10/29/15 0414 10/29/15 1245  GLUCAP 79 137* 94 82 110* 85     Endora Teresi A M.D. [facility] 10/29/2015, 12:55 PM  Pager: [phone]  After 7pm go to [email or web address] - password [email or web address]  Call night coverage person  covering after 7pm

## 2015-10-30 ENCOUNTER — Other Ambulatory Visit: Payer: Self-pay | Admitting: Hematology and Oncology

## 2015-10-30 LAB — TYPE AND SCREEN
ABO/RH(D): O POS
Antibody Screen: NEGATIVE
UNIT DIVISION: 0
Unit division: 0
Unit division: 0
Unit division: 0

## 2015-10-30 LAB — GLUCOSE, CAPILLARY
GLUCOSE-CAPILLARY: 85 mg/dL (ref 65–99)
GLUCOSE-CAPILLARY: 105 mg/dL — AB (ref 65–99)
Glucose-Capillary: 83 mg/dL (ref 65–99)
Glucose-Capillary: 86 mg/dL (ref 65–99)

## 2015-10-30 LAB — CBC
HCT: 29.9 % — ABNORMAL LOW (ref 36.0–46.0)
HEMOGLOBIN: 9.6 g/dL — AB (ref 12.0–15.0)
MCH: 28.6 pg (ref 26.0–34.0)
MCHC: 32.1 g/dL (ref 30.0–36.0)
MCV: 89 fL (ref 78.0–100.0)
PLATELETS: 274 10*3/uL (ref 150–400)
RBC: 3.36 MIL/uL — AB (ref 3.87–5.11)
RDW: 17.2 % — ABNORMAL HIGH (ref 11.5–15.5)
WBC: 7.7 10*3/uL (ref 4.0–10.5)

## 2015-10-30 MED ORDER — HYDROCORTISONE ACE-PRAMOXINE 1-1 % RE FOAM: 1.0000 | Freq: Two times a day (BID) | RECTAL | Status: AC

## 2015-10-30 MED ORDER — CALCIUM CARBONATE ANTACID 500 MG PO CHEW: 2.0000 | CHEWABLE_TABLET | Freq: Every day | ORAL | Status: DC | PRN

## 2015-10-30 MED ORDER — POLYETHYLENE GLYCOL 3350 17 G PO PACK: 17.0000 g | PACK | Freq: Every day | ORAL | Status: AC

## 2015-10-30 MED ORDER — RENA-VITE PO TABS: 1.0000 | ORAL_TABLET | Freq: Every day | ORAL | Status: DC

## 2015-10-30 MED ORDER — CALCIUM CARBONATE ANTACID 500 MG PO CHEW: 400.0000 mg | CHEWABLE_TABLET | Freq: Three times a day (TID) | ORAL | Status: DC

## 2015-10-30 MED ORDER — PRO-STAT SUGAR FREE PO LIQD: 30.0000 mL | Freq: Two times a day (BID) | ORAL | Status: DC

## 2015-10-30 NOTE — Progress Notes (Signed)
Assessment/Plan: 1. Lower GI bleeding:  stercoral ulcers  2. ESRD -TTS at [facility]. Started OP HD 09/08/15. HD tomorrow as OP (TueThuSat, 4 hrs 0 min, 180NRe Optiflux, BFR 400, DFR Autoflow 1.5, EDW 104 (kg), Dialysate 2.0 K, 2.0 Ca) Will need sl lower EDW. 3. Anemia - s/p multiple PRBCs 4. 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 .   Subjective: Interval History: Being discharged  Objective: Vital signs in last 24 hours: Temp:  [99.5 F (37.5 C)-99.8 F (37.7 C)] 99.5 F (37.5 C) (03/17 0605) Pulse Rate:  [75-94] 85 (03/17 0932) Resp:  [18-20] 20 (03/17 0605) BP: (98-158)/(45-80) 98/45 mmHg (03/17 0932) SpO2:  [95 %-100 %] 98 % (03/17 0605) Weight:  [102.8 kg (226 lb 10.1 oz)] 102.8 kg (226 lb 10.1 oz) (03/17 0605) Weight change: -1.3 kg (-2 lb 13.9 oz)  Intake/Output from previous day: 03/16 0701 - 03/17 0700 In: 895 [P.O.:560; Blood:335] Out: 1204 [Urine:302; Emesis/NG output:1; Stool:1] Intake/Output this shift: Total I/O In: -  Out: 1 [Urine:1]  General appearance: alert and cooperative Head: Normocephalic, without obvious abnormality, atraumatic, pale, left neck incision healing Extremities: extremities normal, atraumatic, no cyanosis or edema  Lab Results:  Recent Labs  10/29/15 0626 10/30/15 0644  WBC 9.7 7.7  HGB 7.7* 9.6*  HCT 22.9* 29.9*  PLT 289 274   BMET:  Recent Labs  10/28/15 0140 10/29/15 0626  NA 138 140  K 4.0 4.4  CL 97* 103  CO2 28 27  GLUCOSE 127* 108*  BUN 15 27*  CREATININE 4.36* 5.95*  CALCIUM 8.1* 8.1*   No results for input(s): PTH in the last 72 hours. Iron Studies: No results for input(s): IRON, TIBC, TRANSFERRIN, FERRITIN in the last 72 hours. Studies/Results: No results found.  Scheduled: . sodium chloride   Intravenous Once  . sodium chloride   Intravenous Once  . budesonide  0.25 mg Inhalation BID  . calcium carbonate  400 mg of elemental calcium Oral TID WC  . doxercalciferol  2 mcg Intravenous Q T,Th,Sa-HD   . DULoxetine  30 mg Oral Daily   And  . DULoxetine  60 mg Oral QHS  . famotidine  20 mg Oral Daily  . feeding supplement (PRO-STAT SUGAR FREE 64)  30 mL Oral BID  . ferric gluconate (FERRLECIT/NULECIT) IV  250 mg Intravenous Q T,Th,Sa-HD  . hydrocortisone   Rectal TID  . hydrocortisone-pramoxine  1 applicator Rectal BID  . insulin aspart  0-9 Units Subcutaneous Q4H  . insulin glargine  10 Units Subcutaneous QHS  . levothyroxine  150 mcg Oral QAC breakfast  . metoprolol tartrate  25 mg Oral BID  . multivitamin  1 tablet Oral QHS  . polyethylene glycol  17 g Oral BID  . witch hazel-glycerin   Topical QID    LOS: 9 days   Takashi Korol C 10/30/2015,12:35 PM

## 2015-10-30 NOTE — Care Management Note (Signed)
Case Management Note  Patient Details  Name: Tami Elliott MRN: [id number] Date of Birth: February 05, 1951  Subjective/Objective:                  Independent patient admitted from home, lives with husband. GI bleed, multiple transfusions. Spoke with patient at the bedside, denies any barriers to care.  Action/Plan:  Will DC to home, No CM needs identified.  Expected Discharge Date:                  Expected Discharge Plan:  Home/Self Care  In-House Referral:     Discharge planning Services  CM Consult  Post Acute Care Choice:  NA Choice offered to:  Patient  DME Arranged:    DME Agency:     HH Arranged:    [place] Agency:     Status of Service:  Completed, signed off  Medicare Important Message Given:  Yes Date Medicare IM Given:    Medicare IM give by:    Date Additional Medicare IM Given:    Additional Medicare Important Message give by:     If discussed at [place] of Stay Meetings, dates discussed:    Additional Comments:  Carles Collet, RN 10/30/2015, 11:34 AM

## 2015-10-30 NOTE — Discharge Summary (Signed)
Physician Discharge Summary  GERTRUE SCHOONMAKER [id number] DOB: [birth year] DOA: 10/21/2015  PCP: Harvie Junior, MD  Admit date: 10/21/2015 Discharge date: 10/30/2015  Time spent: 40 minutes  Recommendations for Outpatient Follow-up:  1. Follow-up with primary care physician within one week. 2. Follow-up with Dr. Havery Moros in 3-4 weeks.   Discharge Diagnoses:  Principal Problem:   Acute GI bleeding Active Problems:   Hypothyroidism   Type 2 diabetes mellitus with diabetic neuropathy, with long-term current use of insulin (HCC)   Tongue cancer ([place])   GI bleeding   Acute blood loss anemia   Anorectal ulcer   Hemorrhoid   Discharge Condition: Stable  Diet recommendation: Heart healthy  Filed Weights   10/29/15 0710 10/29/15 1150 10/30/15 0605  Weight: 102.8 kg (226 lb 10.1 oz) 101 kg (222 lb 10.6 oz) 102.8 kg (226 lb 10.1 oz)    History of present illness:  Tami Elliott is a 65 y.o. female with history of ESRD on hemodialysis, diabetes mellitus type 2, tongue cancer who has had recent surgery at [facility] and had staples removed last week presents to the ER because of rectal bleeding. Patient had 2 episodes of rectal bleeding last evening. Patient states she passed large clots. Both episodes where one hour apart. When EMSpatient was mildly hypotensive for which patient was given a fluid bolus. In the ER patient has not had any further episodes. Patient states she has had some hemorrhoidal issues last week for which she had taken medications topically. Denies any nausea vomiting chest pain or shortness of breath. Patient has been admitted for further management of rectal bleeding most likely lower GI. Patient only takes aspirin. Denies taking any NSAIDs. Has had colonoscopy in 2011 which was reported normal.    Hospital Course:   Lower GI bleeding -felt to be due to Stercoral ulcers from constipation secondary to narcotics -appreciate GI consult, Significant  bleed led to transfusion of 7 units of packed RBCs. -10/23/15 flex sig: thrombosed internal hemorrohoids, anal ulcers. Path pending, no diverticuli noted -recommended laxatives and Hydrocort cream topically -Discharged on MiraLAX, asked to avoid constipation and she is on narcotics and Tums.  Acute blood loss anemia -S/p 7 units PRBC this admission -Hemoglobin stable at 9.6 on discharge. -Acute blood loss anemia secondary to lower GI bleed.  SVT -Resolved -Seen and evaluated by cardiology, recommended PRN metoprolol 2.5 mg IV. -Oral home metoprolol resumed.  ESRD on hemodialysis: TTS -Started dialysis on January 2017 -She was on high dose of Lasix and potassium, this is discussed with nephrology prior to discharge. -Discontinued Lasix/potassium, patient is getting full RRT now.  Hypothyroidism - Continue Synthroid  Type 2 diabetes mellitus with diabetic neuropathy, with long-term current use of insulin (HCC) - SSI, lantus, CBGs stable, appears to be controlled last hemoglobin A1c was 5.5 on December 2016.  Tongue cancer ([place])  status post recent surgery  Essential hypertension - BP was soft, soft, metoprolol, lasix on hold, resume metoprolol at lower dose.   Procedures:  Flexible sigmoidoscopy done by Dr. Carlean Purl on 10/23/2015 Impression:  - Thrombosed external hemorrhoids found on perianal exam.  - Multiple ulcers at the anus and in the distal rectum. Biopsied.  - External and internal hemorrhoids.  - Anal papilla(e) were hypertrophied.  Consultations:  Gastroenterology.  Nephrology  Discharge Exam: Filed Vitals:   10/30/15 0605 10/30/15 0932  BP: 158/56 98/45  Pulse: 75 85  Temp: 99.5 F (37.5 C)   Resp: 20    General: Alert  and awake, oriented x3, not in any acute distress. HEENT: anicteric sclera, pupils reactive to light and accommodation, EOMI CVS: S1-S2 clear, no murmur  rubs or gallops Chest: clear to auscultation bilaterally, no wheezing, rales or rhonchi Abdomen: soft nontender, nondistended, normal bowel sounds, no organomegaly Extremities: no cyanosis, clubbing or edema noted bilaterally Neuro: Cranial nerves II-XII intact, no focal neurological deficits  Discharge Instructions   Discharge Instructions    Diet - low sodium heart healthy    Complete by:  As directed      Increase activity slowly    Complete by:  As directed           Current Discharge Medication List    START taking these medications   Details  hydrocortisone-pramoxine (PROCTOFOAM-HC) rectal foam Place 1 applicator rectally 2 (two) times daily. Qty: 10 g, Refills: 0    polyethylene glycol (MIRALAX) packet Take 17 g by mouth daily. Qty: 30 each, Refills: 1      CONTINUE these medications which have NOT CHANGED   Details  calcium carbonate (TUMS - DOSED IN MG ELEMENTAL CALCIUM) 500 MG chewable tablet Chew 2 tablets by mouth daily as needed for indigestion or heartburn.    cetirizine (ZYRTEC) 10 MG tablet Take 10 mg by mouth daily as needed for allergies.  Refills: 5    Cholecalciferol (VITAMIN D3) 2000 UNITS TABS Take 1 capsule by mouth daily.    Cyanocobalamin (VITAMIN B-12) 1000 MCG SUBL Place 1 tablet under the tongue daily.    DULoxetine (CYMBALTA) 30 MG capsule Take 30-60 mg by mouth See admin instructions. Pt takes 30mg  in am and 60mg  in pm    HYDROcodone-acetaminophen (NORCO) 10-325 MG tablet Take 1 tablet by mouth every 6 (six) hours as needed. Qty: 20 tablet, Refills: 0    insulin glargine (LANTUS) 100 UNIT/ML injection Inject 0.25 mLs (25 Units total) into the skin at bedtime. Qty: 10 mL, Refills: 11    levothyroxine (SYNTHROID, LEVOTHROID) 150 MCG tablet Take 150 mcg by mouth daily.      metoprolol (LOPRESSOR) 50 MG tablet Take 50 mg by mouth 2 (two) times daily.    ranitidine (ZANTAC) 300 MG tablet Take 300 mg by mouth daily as needed for heartburn.     vitamin C (ASCORBIC ACID) 500 MG tablet Take 500 mg by mouth daily.      STOP taking these medications     furosemide (LASIX) 80 MG tablet      potassium chloride 20 MEQ TBCR      fluticasone (FLOVENT HFA) 44 MCG/ACT inhaler        No Known Allergies Follow-up Information    Follow up with Manus Gunning, MD On 12/02/2015.   Specialty:  Gastroenterology   Why:  1:45 PM appointment with GI doctor to follow up rectal bleeding.  call to reschedule if unable to make it to appointment   Contact information:   [place] [phone]        The results of significant diagnostics from this hospitalization (including imaging, microbiology, ancillary and laboratory) are listed below for reference.    Significant Diagnostic Studies: Dg Chest Port 1 View  10/26/2015  CLINICAL DATA:  Shortness of breath and cough EXAM: PORTABLE CHEST 1 VIEW COMPARISON:  08/07/2015 FINDINGS: No cardiomegaly. Negative aortic contours accounting for rightward rotation. Improved inflation compared to prior. No effusions seen today. No edema or pneumonia. No pneumothorax. IMPRESSION: Negative portable chest. Electronically Signed   By:  Monte Fantasia M.D.   On: 10/26/2015 00:25    Microbiology: Recent Results (from the past 240 hour(s))  MRSA PCR Screening     Status: None   Collection Time: 10/22/15  1:08 AM  Result Value Ref Range Status   MRSA by PCR NEGATIVE NEGATIVE Final    Comment:        The GeneXpert MRSA Assay (FDA approved for NASAL specimens only), is one component of a comprehensive MRSA colonization surveillance program. It is not intended to diagnose MRSA infection nor to guide or monitor treatment for MRSA infections.      Labs: Basic Metabolic Panel:  Recent Labs Lab 10/25/15 1943 10/27/15 0802 10/28/15 0140 10/29/15 0626  NA 137 140 138 140  K 4.2 4.6 4.0 4.4  CL 97* 103 97* 103  CO2 26 24 28 27   GLUCOSE 94 115* 127* 108*   BUN 14 22* 15 27*  CREATININE 4.31* 6.08* 4.36* 5.95*  CALCIUM 8.4* 8.4* 8.1* 8.1*  PHOS  --  5.7*  --  5.6*   Liver Function Tests:  Recent Labs Lab 10/27/15 0802 10/29/15 0626  ALBUMIN 2.4* 2.3*   No results for input(s): LIPASE, AMYLASE in the last 168 hours. No results for input(s): AMMONIA in the last 168 hours. CBC:  Recent Labs Lab 10/27/15 0802 10/27/15 1650 10/28/15 0140 10/28/15 0810 10/29/15 0626 10/30/15 0644  WBC 8.5 10.6* 9.4  --  9.7 7.7  HGB 7.8* 8.6* 7.8* 7.7* 7.7* 9.6*  HCT 24.3* 26.0* 24.9* 23.0* 22.9* 29.9*  MCV 89.0 90.3 90.5  --  88.8 89.0  PLT 247 253 286  --  289 274   Cardiac Enzymes:  Recent Labs Lab 10/25/15 1943 10/26/15 0105 10/26/15 0815  TROPONINI <0.03 0.09* 0.06*   BNP: BNP (last 3 results)  Recent Labs  08/03/15 2201  BNP 540.5*    ProBNP (last 3 results) No results for input(s): PROBNP in the last 8760 hours.  CBG:  Recent Labs Lab 10/29/15 1959 10/29/15 2152 10/30/15 0005 10/30/15 0430 10/30/15 0815  GLUCAP 89 88 83 86 85       Signed:  Ariyon Mittleman A MD.  [facility] 10/30/2015, 11:50 AM

## 2015-10-30 NOTE — Progress Notes (Signed)
Lupita Leash to be D/C'd to home per MD order.  Discussed with the patient and all questions fully answered.  VSS, Skin clean, dry and intact without evidence of skin break down, no evidence of skin tears noted. IV catheter discontinued intact. Site without signs and symptoms of complications. Dressing and pressure applied.  An After Visit Summary was printed and given to the patient. Patient aware prescriptions were sent to her pharmacy.   D/c education completed with patient/family including follow up instructions, medication list, d/c activities limitations if indicated, with other d/c instructions as indicated by MD - patient able to verbalize understanding, all questions fully answered.   Patient instructed to return to ED, call 911, or call MD for any changes in condition.   Patient escorted via [place], and D/C home via private auto.  Breeze Berringer L Price 10/30/2015 1:16 PM

## 2015-10-30 NOTE — Progress Notes (Signed)
[facility] Progress Note  Assessment/Plan: 1. Lower GI bleeding: stercoral ulcers on flex sig. S/p 8 units PRBCs. - the last two on 3/16  2. ESRD -TTS at [facility]. New to HD started OP HD 09/08/15. HD Saturday  NO HEPARIN and change to no heparin at discharge 3. Anemia -  HBG 9.6  After 2s unit of PRBCs 3/16- favor waiting another 24 hours before d/c to make sure hgb has stabilized 4. Secondary hyperparathyroidism - P 5.6 resume binders; on hectorol 5. HTN/volume - HD yesterday Net UF 1000 Tues and 900 on Thurs post wt 101 - need standing wts for accuracy on Sat  6. Nutrition - Albumin 2.3. . Renal diet with prostat/vitamins 7. 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 Staples out of neck.  8. DM: per primary  Myriam Jacobson, PA-C [place] [phone] 10/30/2015,9:56 AM  LOS: 9 days   Subjective:   No c/o; was told she might go home today  Objective Filed Vitals:   10/29/15 1250 10/29/15 2119 10/30/15 0605 10/30/15 0932  BP: 119/80 126/62 158/56 98/45  Pulse: 94 90 75 85  Temp:  99.8 F (37.7 C) 99.5 F (37.5 C)   TempSrc:  Oral Oral   Resp:  18 20   Height:      Weight:   102.8 kg (226 lb 10.1 oz)   SpO2: 100% 95% 98%    Physical Exam General: NAD ate 100% breakfast Heart: RRR Lungs: no rales Abdomen: obese soft Extremities: no LE edema Dialysis Access: left lower AVF + bruit still bruised from infiltration last week  Dialysis Orders: [facility] TueThuSat, 4 hrs 0 min, 180NRe Optiflux, BFR 400, DFR Autoflow 1.5, EDW 104 (kg), Dialysate 2.0 K, 2.0 Ca, UFR Profile: None, Sodium Model: None, Access: LFA AV Fistula Heparin: 5000 units TTS Hectoral: 2 mcg IV per treatment TTS Venofer: 50 mg IV weekly (last dose 10/01/15)  Additional Objective Labs: Basic Metabolic Panel:  Recent Labs Lab 10/27/15 0802 10/28/15 0140 10/29/15 0626  NA 140 138 140  K 4.6 4.0 4.4  CL 103 97* 103  CO2 24 28 27   GLUCOSE 115* 127* 108*  BUN  22* 15 27*  CREATININE 6.08* 4.36* 5.95*  CALCIUM 8.4* 8.1* 8.1*  PHOS 5.7*  --  5.6*   Liver Function Tests:  Recent Labs Lab 10/27/15 0802 10/29/15 0626  ALBUMIN 2.4* 2.3*   CBC:  Recent Labs Lab 10/27/15 0802 10/27/15 1650 10/28/15 0140 10/28/15 0810 10/29/15 0626 10/30/15 0644  WBC 8.5 10.6* 9.4  --  9.7 7.7  HGB 7.8* 8.6* 7.8* 7.7* 7.7* 9.6*  HCT 24.3* 26.0* 24.9* 23.0* 22.9* 29.9*  MCV 89.0 90.3 90.5  --  88.8 89.0  PLT 247 253 286  --  289 274   Blood Culture    Component Value Date/Time   SDES URINE, RANDOM 07/03/2012 1208   SPECREQUEST NONE 07/03/2012 1208   CULT ESCHERICHIA COLI 07/03/2012 1208   REPTSTATUS 07/05/2012 FINAL 07/03/2012 1208    Cardiac Enzymes:  Recent Labs Lab 10/25/15 1943 10/26/15 0105 10/26/15 0815  TROPONINI <0.03 0.09* 0.06*   CBG:  Recent Labs Lab 10/29/15 1959 10/29/15 2152 10/30/15 0005 10/30/15 0430 10/30/15 0815  GLUCAP 89 88 83 86 85   Medications:   . sodium chloride   Intravenous Once  . sodium chloride   Intravenous Once  . budesonide  0.25 mg Inhalation BID  . doxercalciferol  2 mcg Intravenous Q T,Th,Sa-HD  . DULoxetine  30  mg Oral Daily   And  . DULoxetine  60 mg Oral QHS  . famotidine  20 mg Oral Daily  . ferric gluconate (FERRLECIT/NULECIT) IV  250 mg Intravenous Q T,Th,Sa-HD  . hydrocortisone   Rectal TID  . hydrocortisone-pramoxine  1 applicator Rectal BID  . insulin aspart  0-9 Units Subcutaneous Q4H  . insulin glargine  10 Units Subcutaneous QHS  . levothyroxine  150 mcg Oral QAC breakfast  . metoprolol tartrate  25 mg Oral BID  . polyethylene glycol  17 g Oral BID  . witch hazel-glycerin   Topical QID

## 2015-11-07 ENCOUNTER — Emergency Department (HOSPITAL_COMMUNITY)
Admission: EM | Admit: 2015-11-07 | Discharge: 2015-11-08 | Disposition: A | Discharge status: Other Acute Inpatient Hospital | Payer: Medicare Other | Attending: Emergency Medicine | Admitting: Emergency Medicine

## 2015-11-07 ENCOUNTER — Encounter (HOSPITAL_COMMUNITY): Payer: Self-pay | Admitting: Emergency Medicine

## 2015-11-07 DIAGNOSIS — Z87442 Personal history of urinary calculi: Secondary | ICD-10-CM | POA: Diagnosis not present

## 2015-11-07 DIAGNOSIS — T814XXA Infection following a procedure, initial encounter: Secondary | ICD-10-CM | POA: Insufficient documentation

## 2015-11-07 DIAGNOSIS — Y69 Unspecified misadventure during surgical and medical care: Secondary | ICD-10-CM | POA: Diagnosis not present

## 2015-11-07 DIAGNOSIS — Z85810 Personal history of malignant neoplasm of tongue: Secondary | ICD-10-CM | POA: Insufficient documentation

## 2015-11-07 DIAGNOSIS — Z992 Dependence on renal dialysis: Secondary | ICD-10-CM | POA: Diagnosis not present

## 2015-11-07 DIAGNOSIS — M1990 Unspecified osteoarthritis, unspecified site: Secondary | ICD-10-CM | POA: Insufficient documentation

## 2015-11-07 DIAGNOSIS — Z8781 Personal history of (healed) traumatic fracture: Secondary | ICD-10-CM | POA: Insufficient documentation

## 2015-11-07 DIAGNOSIS — I120 Hypertensive chronic kidney disease with stage 5 chronic kidney disease or end stage renal disease: Secondary | ICD-10-CM | POA: Insufficient documentation

## 2015-11-07 DIAGNOSIS — Z8669 Personal history of other diseases of the nervous system and sense organs: Secondary | ICD-10-CM | POA: Insufficient documentation

## 2015-11-07 DIAGNOSIS — K219 Gastro-esophageal reflux disease without esophagitis: Secondary | ICD-10-CM | POA: Insufficient documentation

## 2015-11-07 DIAGNOSIS — N186 End stage renal disease: Secondary | ICD-10-CM | POA: Insufficient documentation

## 2015-11-07 DIAGNOSIS — Z7952 Long term (current) use of systemic steroids: Secondary | ICD-10-CM | POA: Diagnosis not present

## 2015-11-07 DIAGNOSIS — Z86010 Personal history of colonic polyps: Secondary | ICD-10-CM | POA: Insufficient documentation

## 2015-11-07 DIAGNOSIS — Z8701 Personal history of pneumonia (recurrent): Secondary | ICD-10-CM | POA: Diagnosis not present

## 2015-11-07 DIAGNOSIS — E669 Obesity, unspecified: Secondary | ICD-10-CM | POA: Insufficient documentation

## 2015-11-07 DIAGNOSIS — F329 Major depressive disorder, single episode, unspecified: Secondary | ICD-10-CM | POA: Diagnosis not present

## 2015-11-07 DIAGNOSIS — E119 Type 2 diabetes mellitus without complications: Secondary | ICD-10-CM | POA: Insufficient documentation

## 2015-11-07 DIAGNOSIS — E039 Hypothyroidism, unspecified: Secondary | ICD-10-CM | POA: Diagnosis not present

## 2015-11-07 DIAGNOSIS — A419 Sepsis, unspecified organism: Secondary | ICD-10-CM | POA: Diagnosis not present

## 2015-11-07 DIAGNOSIS — Z79899 Other long term (current) drug therapy: Secondary | ICD-10-CM | POA: Diagnosis not present

## 2015-11-07 DIAGNOSIS — R609 Edema, unspecified: Secondary | ICD-10-CM

## 2015-11-07 DIAGNOSIS — Z9889 Other specified postprocedural states: Secondary | ICD-10-CM | POA: Insufficient documentation

## 2015-11-07 DIAGNOSIS — Z862 Personal history of diseases of the blood and blood-forming organs and certain disorders involving the immune mechanism: Secondary | ICD-10-CM | POA: Diagnosis not present

## 2015-11-07 DIAGNOSIS — Z794 Long term (current) use of insulin: Secondary | ICD-10-CM | POA: Diagnosis not present

## 2015-11-07 DIAGNOSIS — T8140XA Infection following a procedure, unspecified, initial encounter: Secondary | ICD-10-CM

## 2015-11-07 NOTE — ED Provider Notes (Signed)
CSN: [id number]     Arrival date & time 11/07/15  2247 History   By signing my name below, I, Forrestine Him, attest that this documentation has been prepared under the direction and in the presence of Merryl Hacker, MD.  Electronically Signed: Forrestine Him, ED Scribe. 11/08/2015. 12:28 AM.   Chief Complaint  Patient presents with  . Post-op Problem   The history is provided by the patient. No language interpreter was used.    HPI Comments: Tami Elliott is a 65 y.o. female with a PMHx of HTN, DM, ESRD, and cancer who presents to the Emergency Department here for a post-op problem this evening. Pt states she underwent neck surgery at the end of February performed by Dr. Owens Shark to remove cancer. Stitches were removed on 3/8 without difficulty. However, pt states she went to remove her dressing from wound today and noted drainage, redness, and discomfort to area. Pt states area has been cleaned as directed without any issue since surgery. No recent fever or chills. Pt typically dialyzes on Tuesdays, Thursdays, and Saturdays. However, last successful treatment was on Thursday 3/23 as she missed her treatment today due to transportation issues.  Patient's surgeon at [facility] with Dr. Vicie Mutters, ENT.  PCP: Harvie Junior, MD    Past Medical History  Diagnosis Date  . Diabetes mellitus   . Hypertension   . Hypothyroidism   . Cataract     Left eye( Dr. Katy Fitch, patient uninsured so she can't  get  an intervention)  . ESRD (end stage renal disease) ([place]) 09/2015    started HD in  09/2015. nephrolithiasis, s/p removal 12/05/07  . Back pain     myofascial, excacerberated in 6-7/09, 9/09- required narcotics at those times  . Elbow fracture, right   . Obesity   . Depression     better on nortriptyline  . Hyperplastic colon polyp 2001    f/u colonoscopy 11/23/06 was normal( Dr. Carlean Purl), falls into normal risk screening( next colonoscopy due in 2018)  . Pap smear for cervical cancer screening      12/14/2005: normal, evidence of candida. 05/16/2007: normal, benign reperative changes.  . Anemia   . Sleep apnea   . Pneumonia     as a child  . GERD (gastroesophageal reflux disease)   . Arthritis   . Nephrolithiasis 11/2007, 01/2012  . Cancer ([place]) 06/08/15 bx    SCC of left lateral tongue   . Acute pulmonary edema ([place]) 08/03/2015  . Anorectal ulcer 10/23/2015    and thrombosed internal hemorrhoids. with hematochezia   Past Surgical History  Procedure Laterality Date  . Cataract extraction Left   . Lithotripsy  11/2007  . Dilation and curettage of uterus    . Av fistula placement Left 05/12/2015    Procedure: ARTERIOVENOUS (AV) FISTULA CREATION;  Surgeon: Serafina Mitchell, MD;  Location: [facility];  Service: Vascular;  Laterality: Left;  . Av fistula placement  05/12/15    left fore arm  . Flexible sigmoidoscopy N/A 10/23/2015    Procedure: FLEXIBLE SIGMOIDOSCOPY;  Surgeon: Gatha Mayer, MD;  Location: [facility];  Service: Endoscopy;  Laterality: N/A;  . Glossectomy Left 10/06/15    partial gossectomy and neck dissection at [facility].  1/18 neck nodes positive. SCC of tongue   Family History  Problem Relation Age of Onset  . Diabetes Mother   . Heart disease Mother     before age 23  . Hypertension Mother    Social  History  Substance Use Topics  . Smoking status: Never Smoker   . Smokeless tobacco: Never Used  . Alcohol Use: No   OB History    No data available     Review of Systems  Constitutional: Positive for fever. Negative for chills.  Respiratory: Negative for shortness of breath.   Cardiovascular: Negative for chest pain.  Gastrointestinal: Negative for nausea, vomiting and abdominal pain.  Skin: Positive for color change and wound.  Neurological: Negative for headaches.  Psychiatric/Behavioral: Negative for confusion.  All other systems reviewed and are negative.     Allergies  Review of patient's allergies indicates no known allergies.  Home  Medications   Prior to Admission medications   Medication Sig Start Date End Date Taking? Authorizing Provider  calcium carbonate (TUMS - DOSED IN MG ELEMENTAL CALCIUM) 500 MG chewable tablet Chew 2 tablets by mouth daily as needed for indigestion or heartburn.   Yes Historical Provider, MD  cetirizine (ZYRTEC) 10 MG tablet Take 10 mg by mouth daily as needed for allergies.  04/02/15  Yes Historical Provider, MD  Cholecalciferol (VITAMIN D3) 2000 UNITS TABS Take 1 capsule by mouth daily.   Yes Historical Provider, MD  Cyanocobalamin (VITAMIN B-12) 1000 MCG SUBL Place 1 tablet under the tongue daily.   Yes Historical Provider, MD  DULoxetine (CYMBALTA) 30 MG capsule Take 30-60 mg by mouth See admin instructions. Pt takes 30mg  in am and 60mg  in pm   Yes Historical Provider, MD  HYDROcodone-acetaminophen (NORCO) 10-325 MG tablet Take 1 tablet by mouth every 6 (six) hours as needed. Patient taking differently: Take 1 tablet by mouth every 6 (six) hours as needed for moderate pain.  05/12/15  Yes Samantha J Rhyne, PA-C  hydrocortisone-pramoxine (PROCTOFOAM-HC) rectal foam Place 1 applicator rectally 2 (two) times daily. 10/30/15  Yes Verlee Monte, MD  insulin glargine (LANTUS) 100 UNIT/ML injection Inject 0.25 mLs (25 Units total) into the skin at bedtime. 08/08/15  Yes Thurnell Lose, MD  levothyroxine (SYNTHROID, LEVOTHROID) 150 MCG tablet Take 150 mcg by mouth daily.     Yes Historical Provider, MD  metoprolol (LOPRESSOR) 50 MG tablet Take 50 mg by mouth 2 (two) times daily.   Yes Historical Provider, MD  polyethylene glycol (MIRALAX) packet Take 17 g by mouth daily. 10/30/15  Yes Verlee Monte, MD  ranitidine (ZANTAC) 300 MG tablet Take 300 mg by mouth daily as needed for heartburn.   Yes Historical Provider, MD  vitamin C (ASCORBIC ACID) 500 MG tablet Take 500 mg by mouth daily.   Yes Historical Provider, MD   Triage Vitals: BP 120/75 mmHg  Pulse 102  Temp(Src) 100 F (37.8 C) (Oral)  Resp 19   SpO2 99%   Physical Exam  Constitutional: She is oriented to person, place, and time.  Overweight, chronically ill-appearing, no acute distress  HENT:  Head: Normocephalic and atraumatic.  Well-healing incision over the left submandibular region, there is extensive erythema both inferior and superiorly with warmth, purulent drainage noted with palpation of the incision  Eyes: Pupils are equal, round, and reactive to light.  Cardiovascular: Normal rate, regular rhythm and normal heart sounds.   No murmur heard. Pulmonary/Chest: Effort normal and breath sounds normal. No respiratory distress. She has no wheezes.  Fistula left upper extremity  Abdominal: Soft. Bowel sounds are normal. There is no tenderness. There is no rebound.  Musculoskeletal: She exhibits edema.  Neurological: She is alert and oriented to person, place, and time.  Skin: Skin is  warm and dry.  Psychiatric: She has a normal mood and affect.  Nursing note and vitals reviewed.   ED Course  Procedures (including critical care time)  CRITICAL CARE Performed by: Merryl Hacker   Total critical care time: 45 minutes  Critical care time was exclusive of separately billable procedures and treating other patients.  Critical care was necessary to treat or prevent imminent or life-threatening deterioration.  Critical care was time spent personally by me on the following activities: development of treatment plan with patient and/or surrogate as well as nursing, discussions with consultants, evaluation of patient's response to treatment, examination of patient, obtaining history from patient or surrogate, ordering and performing treatments and interventions, ordering and review of laboratory studies, ordering and review of radiographic studies, pulse oximetry and re-evaluation of patient's condition.   DIAGNOSTIC STUDIES: Oxygen Saturation is 98% on RA, Normal by my interpretation.    COORDINATION OF CARE: 12:02 AM-  Will give Vancocin. Will order imaging, blood work, and urinalysis. Discussed treatment plan with pt at bedside and pt agreed to plan.     Labs Review Labs Reviewed  COMPREHENSIVE METABOLIC PANEL - Abnormal; Notable for the following:    Chloride 98 (*)    Glucose, Bld 131 (*)    BUN 32 (*)    Creatinine, Ser 5.13 (*)    Albumin 2.4 (*)    ALT 13 (*)    GFR calc non Af Amer 8 (*)    GFR calc Af Amer 9 (*)    All other components within normal limits  CBC WITH DIFFERENTIAL/PLATELET - Abnormal; Notable for the following:    WBC 22.5 (*)    RBC 1.05 (*)    Hemoglobin 3.1 (*)    HCT 9.7 (*)    RDW 16.7 (*)    Platelets 610 (*)    Neutro Abs 16.2 (*)    Monocytes Absolute 2.1 (*)    All other components within normal limits  HEMOGLOBIN AND HEMATOCRIT, BLOOD - Abnormal; Notable for the following:    Hemoglobin 9.4 (*)    HCT 29.9 (*)    All other components within normal limits  I-STAT CG4 LACTIC ACID, ED - Abnormal; Notable for the following:    Lactic Acid, Venous 2.57 (*)    All other components within normal limits  CULTURE, BLOOD (ROUTINE X 2)  CULTURE, BLOOD (ROUTINE X 2)  URINE CULTURE  URINALYSIS, ROUTINE W REFLEX MICROSCOPIC (NOT AT [facility])  I-STAT CG4 LACTIC ACID, ED  TYPE AND SCREEN  PREPARE RBC (CROSSMATCH)    Imaging Review Dg Chest 2 View  11/08/2015  CLINICAL DATA:  65 year old female with fever. History of recent neck surgery EXAM: CHEST  2 VIEW COMPARISON:  Radiograph dated 10/25/2015 FINDINGS: Two views of the chest do not demonstrate a focal consolidation. There is no pleural effusion or pneumothorax. The cardiac silhouette is within normal limits. Surgical clips noted at the base of the neck on the left. IMPRESSION: No active cardiopulmonary disease. Electronically Signed   By: Anner Crete M.D.   On: 11/08/2015 00:49   Ct Soft Tissue Neck Wo Contrast  11/08/2015  CLINICAL DATA:  Status post partial glossectomy and nodal dissection February 2017 with  swelling and drainage at surgical site. History of head and neck cancer, hypertension, diabetes, dialysis. EXAM: CT NECK WITHOUT CONTRAST TECHNIQUE: Multidetector CT imaging of the neck was performed following the standard protocol without intravenous contrast. COMPARISON:  PET-CT June 30, 2015 FINDINGS: Pharynx and larynx: Status post  apparent LEFT partial glossectomy with surgical clip. Limited assessment due to streak artifact from dental amalgam. Airways patent. Normal appearance the larynx. Salivary glands: Fatty replaced parotid glands. Status post probable LEFT submandibular gland resection, with irregular soft tissue within the LEFT neck, skin staples, subcutaneous gas and fat stranding. Fat stranding extends into the LEFT carotid space, with thickened LEFT platysma. Mildly thickened RIGHT platysma with fat stranding extending to the RIGHT submandibular space. Thyroid: Normal. Lymph nodes: Status post LEFT upper neck nodal dissection. Multiple round, pathologic appearing lymph nodes measuring up to 9 mm, LEFT level 4, with small rounded LEFT supraclavicular lymph nodes. Assessment limited by lack of contrast. Vascular: Mild calcific atherosclerosis the carotid bulbs, limited assessment by noncontrast CT. Limited intracranial: Normal. Visualized orbits: Status post LEFT ocular lens implant, otherwise unremarkable. Mastoids and visualized paranasal sinuses: Well-aerated. Skeleton: No destructive bony lesions. Upper chest: Well aerated.  No superior mediastinal lymphadenopathy. IMPRESSION: Limited noncontrast CT neck. Extensive recent postoperative and post treatment changes of LEFT neck, status post probable LEFT submandibular gland resection and nodal dissection, with fat stranding and small amount of subcutaneous gas, superimposed infection is possible. Status post apparent partial LEFT glossectomy . Pathologic appearing LEFT neck lymphadenopathy, progressed from prior PET-CT. Patent airway.  Electronically Signed   By: Elon Alas M.D.   On: 11/08/2015 02:14   I have personally reviewed and evaluated these images and lab results as part of my medical decision-making.   EKG Interpretation None      MDM   Final diagnoses:  Post op infection  Sepsis, due to unspecified organism [facility])    Patient presents with concerns for postoperative infection.  Temperature of 100 upon arrival. She has evidence of cellulitis and purulent drainage from her incision site. Otherwise her vital signs are stable. She did miss dialysis earlier today.  Also recent history of GI bleed. Lactate is mildly elevated at 2.57. Patient was given fluids. Creatinine is 5.13 and potassium is 3.9.  White count is 22.5. Initial hemoglobin was read out at 3.1. This was repeated and he was 9.4.  Patient was given vancomycin and IV Zosyn for presumed cellulitis and postop infection. Blood cultures obtained. CT neck obtained and shows stranding and subcutaneous gas concerning for infection. Discussed with Dr. Linna Darner, on call for ENT at [facility]. Patient will be transferred to the ER there for definitive management of presumed postop infection. Her lactate cleared after 1 L of fluids. She was stable for transfer.  I personally performed the services described in this documentation, which was scribed in my presence. The recorded information has been reviewed and is accurate.   Merryl Hacker, MD 11/08/15 [phone]

## 2015-11-07 NOTE — ED Notes (Signed)
Patient with post op complication after neck surgery.  Patient having pus coming from incision site.

## 2015-11-08 ENCOUNTER — Emergency Department (HOSPITAL_COMMUNITY): Payer: Medicare Other

## 2015-11-08 DIAGNOSIS — Z794 Long term (current) use of insulin: Secondary | ICD-10-CM | POA: Diagnosis not present

## 2015-11-08 DIAGNOSIS — A419 Sepsis, unspecified organism: Secondary | ICD-10-CM | POA: Diagnosis not present

## 2015-11-08 DIAGNOSIS — Z992 Dependence on renal dialysis: Secondary | ICD-10-CM | POA: Diagnosis not present

## 2015-11-08 DIAGNOSIS — M1990 Unspecified osteoarthritis, unspecified site: Secondary | ICD-10-CM | POA: Diagnosis not present

## 2015-11-08 DIAGNOSIS — Z8781 Personal history of (healed) traumatic fracture: Secondary | ICD-10-CM | POA: Diagnosis not present

## 2015-11-08 DIAGNOSIS — Y69 Unspecified misadventure during surgical and medical care: Secondary | ICD-10-CM | POA: Diagnosis not present

## 2015-11-08 DIAGNOSIS — Z86010 Personal history of colonic polyps: Secondary | ICD-10-CM | POA: Diagnosis not present

## 2015-11-08 DIAGNOSIS — E669 Obesity, unspecified: Secondary | ICD-10-CM | POA: Diagnosis not present

## 2015-11-08 DIAGNOSIS — E119 Type 2 diabetes mellitus without complications: Secondary | ICD-10-CM | POA: Diagnosis not present

## 2015-11-08 DIAGNOSIS — Z85810 Personal history of malignant neoplasm of tongue: Secondary | ICD-10-CM | POA: Diagnosis not present

## 2015-11-08 DIAGNOSIS — E039 Hypothyroidism, unspecified: Secondary | ICD-10-CM | POA: Diagnosis not present

## 2015-11-08 DIAGNOSIS — Z7952 Long term (current) use of systemic steroids: Secondary | ICD-10-CM | POA: Diagnosis not present

## 2015-11-08 DIAGNOSIS — Z8701 Personal history of pneumonia (recurrent): Secondary | ICD-10-CM | POA: Diagnosis not present

## 2015-11-08 DIAGNOSIS — T814XXA Infection following a procedure, initial encounter: Secondary | ICD-10-CM | POA: Diagnosis present

## 2015-11-08 DIAGNOSIS — Z9889 Other specified postprocedural states: Secondary | ICD-10-CM | POA: Diagnosis not present

## 2015-11-08 DIAGNOSIS — N186 End stage renal disease: Secondary | ICD-10-CM | POA: Diagnosis not present

## 2015-11-08 DIAGNOSIS — Z79899 Other long term (current) drug therapy: Secondary | ICD-10-CM | POA: Diagnosis not present

## 2015-11-08 DIAGNOSIS — K219 Gastro-esophageal reflux disease without esophagitis: Secondary | ICD-10-CM | POA: Diagnosis not present

## 2015-11-08 DIAGNOSIS — Z8669 Personal history of other diseases of the nervous system and sense organs: Secondary | ICD-10-CM | POA: Diagnosis not present

## 2015-11-08 DIAGNOSIS — I120 Hypertensive chronic kidney disease with stage 5 chronic kidney disease or end stage renal disease: Secondary | ICD-10-CM | POA: Diagnosis not present

## 2015-11-08 DIAGNOSIS — Z862 Personal history of diseases of the blood and blood-forming organs and certain disorders involving the immune mechanism: Secondary | ICD-10-CM | POA: Diagnosis not present

## 2015-11-08 DIAGNOSIS — Z87442 Personal history of urinary calculi: Secondary | ICD-10-CM | POA: Diagnosis not present

## 2015-11-08 DIAGNOSIS — F329 Major depressive disorder, single episode, unspecified: Secondary | ICD-10-CM | POA: Diagnosis not present

## 2015-11-08 LAB — CBC WITH DIFFERENTIAL/PLATELET
Basophils Absolute: 0.1 10*3/uL (ref 0.0–0.1)
Basophils Relative: 0 %
EOS ABS: 0.3 10*3/uL (ref 0.0–0.7)
Eosinophils Relative: 1 %
HEMATOCRIT: 9.7 % — AB (ref 36.0–46.0)
HEMOGLOBIN: 3.1 g/dL — AB (ref 12.0–15.0)
LYMPHS ABS: 3.9 10*3/uL (ref 0.7–4.0)
Lymphocytes Relative: 17 %
MCH: 29.5 pg (ref 26.0–34.0)
MCHC: 32 g/dL (ref 30.0–36.0)
MCV: 92.4 fL (ref 78.0–100.0)
MONO ABS: 2.1 10*3/uL — AB (ref 0.1–1.0)
MONOS PCT: 9 %
NEUTROS ABS: 16.2 10*3/uL — AB (ref 1.7–7.7)
NEUTROS PCT: 72 %
Platelets: 610 10*3/uL — ABNORMAL HIGH (ref 150–400)
RBC: 1.05 MIL/uL — ABNORMAL LOW (ref 3.87–5.11)
RDW: 16.7 % — AB (ref 11.5–15.5)
WBC: 22.5 10*3/uL — ABNORMAL HIGH (ref 4.0–10.5)

## 2015-11-08 LAB — I-STAT CG4 LACTIC ACID, ED
Lactic Acid, Venous: 1.09 mmol/L (ref 0.5–2.0)
Lactic Acid, Venous: 2.57 mmol/L (ref 0.5–2.0)

## 2015-11-08 LAB — COMPREHENSIVE METABOLIC PANEL
ALBUMIN: 2.4 g/dL — AB (ref 3.5–5.0)
ALK PHOS: 106 U/L (ref 38–126)
ALT: 13 U/L — AB (ref 14–54)
AST: 22 U/L (ref 15–41)
Anion gap: 13 (ref 5–15)
BUN: 32 mg/dL — ABNORMAL HIGH (ref 6–20)
CHLORIDE: 98 mmol/L — AB (ref 101–111)
CO2: 27 mmol/L (ref 22–32)
CREATININE: 5.13 mg/dL — AB (ref 0.44–1.00)
Calcium: 9 mg/dL (ref 8.9–10.3)
GFR calc non Af Amer: 8 mL/min — ABNORMAL LOW (ref >60)
GFR, EST AFRICAN AMERICAN: 9 mL/min — AB (ref >60)
GLUCOSE: 131 mg/dL — AB (ref 65–99)
Potassium: 3.9 mmol/L (ref 3.5–5.1)
SODIUM: 138 mmol/L (ref 135–145)
Total Bilirubin: 0.8 mg/dL (ref 0.3–1.2)
Total Protein: 6.9 g/dL (ref 6.5–8.1)

## 2015-11-08 LAB — PREPARE RBC (CROSSMATCH)

## 2015-11-08 LAB — HEMOGLOBIN AND HEMATOCRIT, BLOOD
HCT: 29.9 % — ABNORMAL LOW (ref 36.0–46.0)
Hemoglobin: 9.4 g/dL — ABNORMAL LOW (ref 12.0–15.0)

## 2015-11-08 MED ORDER — PIPERACILLIN-TAZOBACTAM 3.375 G IVPB 30 MIN
3.3750 g | Freq: Once | INTRAVENOUS | Status: AC
  Administered 2015-11-08: 3.375 g via INTRAVENOUS
  Filled 2015-11-08: qty 50

## 2015-11-08 MED ORDER — VANCOMYCIN HCL IN DEXTROSE 1-5 GM/200ML-% IV SOLN
1000.0000 mg | Freq: Once | INTRAVENOUS | Status: AC
  Administered 2015-11-08: 1000 mg via INTRAVENOUS
  Filled 2015-11-08: qty 200

## 2015-11-08 MED ORDER — SODIUM CHLORIDE 0.9 % IV SOLN
10.0000 mL/h | Freq: Once | INTRAVENOUS | Status: AC
  Administered 2015-11-08: 10 mL/h via INTRAVENOUS

## 2015-11-08 NOTE — ED Notes (Signed)
Called [facility] for transport @ [phone]

## 2015-11-08 NOTE — ED Notes (Signed)
Paged ENT at [facility] @ [phone]

## 2015-11-08 NOTE — ED Notes (Signed)
Critical lab reported to [place] MD

## 2015-11-09 LAB — TYPE AND SCREEN
ABO/RH(D): O POS
Antibody Screen: NEGATIVE
UNIT DIVISION: 0
UNIT DIVISION: 0
Unit division: 0
Unit division: 0

## 2015-11-13 LAB — CULTURE, BLOOD (ROUTINE X 2)
CULTURE: NO GROWTH
CULTURE: NO GROWTH

## 2015-11-16 ENCOUNTER — Other Ambulatory Visit: Payer: Self-pay | Admitting: Physician Assistant

## 2015-11-16 ENCOUNTER — Encounter: Payer: Self-pay | Admitting: *Deleted

## 2015-11-25 ENCOUNTER — Telehealth: Payer: Self-pay | Admitting: *Deleted

## 2015-11-25 NOTE — Telephone Encounter (Signed)
  Oncology Nurse Navigator Documentation  Navigator Location: [facility] Onc (11/25/15 1132) Navigator Encounter Type: Telephone (11/25/15 1132) Telephone: Outgoing Call;Patient Update (11/25/15 1132)     Surgery Date: 10/06/15 (11/25/15 1132)     Called Ms. Fantroy to check on her well-being since her 3/8 hospitalization for suspected GI bleed and 3/25 hospitalization for post-partial glossectomy (conducted 10/06/15) incision site complication.  She provided lengthy updates, noted she has a follow-up appt with Dr. Vicie Mutters next Wednesday, 4/19 1220 for evaluation of surgical incision and discussion of additional tmt.  Dr. Isidore Moos provided update.  Gayleen Orem, RN, BSN, [place] [facility] [place] [phone]                                  Time Spent with Patient: 30 (11/25/15 1132)

## 2015-12-01 ENCOUNTER — Encounter: Payer: Self-pay | Admitting: Gastroenterology

## 2015-12-02 ENCOUNTER — Ambulatory Visit: Payer: Self-pay | Admitting: Gastroenterology

## 2015-12-03 NOTE — Progress Notes (Signed)
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 Size: 2.5 cm greatest dimension Negative resection margins.  Tongue, Deep, Biopsy: Benign skeletal muscle Negative for Malignancy  Left neck, Level 1,2,3, Dissection: One of eighteen lymph nodes positive for metastatic carcinoma (1/18).  Benign left submandibular gland.   Left neck, level 2B, disection: Three lymph nodes, negative for metastasis (0,3)  Patient presented  with symptoms of: She originally presented to her primary MD because of a self palpated lump and discomfort to her Left neck. In August 2016 she felt some pressure sensation over her left epitrochlear region along with soreness on the anterior left side of the tongue and a tickle sensaion on the the left side of her throat. She then was referred to see a ENT MD.   Biopsies of  Tongue revealed: Squamous Cell Carinoma  Nutrition Status Yes No Comments  Weight changes? [x]  []  She reports a 50 lb weight loss since September 2016  Swallowing concerns? [x]  []  She is eating less bread, because it becomes too "gummy" to swallow. She does have taste changes.  PEG? []  [x]     Referrals Yes No Comments  Social Work? []  [x]    Dentistry? [x]  []  Dr. Enrique Sack 06/19/15  Swallowing therapy? []  []    Nutrition? [x]  []  Dory Peru 06/22/15  Med/Onc? [x]  []  Dr. Alvy Bimler 06/16/15   Safety Issues Yes No Comments  Prior radiation? []  [x]    Pacemaker/ICD? []  [x]    Possible current pregnancy? []  [x]    Is the patient on methotrexate? []  [x]     Tobacco/Marijuana/Snuff/ETOH use:   Past/Anticipated interventions by otolaryngology, if any:  Procedures performed during hospitalization: 10/06/2015 Left Partial Glossectomy and Left Selective Neck Dissection by Fenton Malling at [facility].  She saw Dr. Vicie Mutters several days ago, and he felt like  she was healing well. She has an appointment to see him again in 2 months.    Past/Anticipated interventions by medical oncology, if any:  Dr. Alvy Bimler last saw her 06/16/15. No appointment is scheduled at this time.      Current Complaints / other details:   She presented to a [facility] Emergency Department on 11/07/15 with concerns for postoperative infection. Temperature of 100 upon arrival. She has evidence of cellulitis and purulent drainage from her incision site. She was then transferred to [facility] because her ENT is Dr. Fenton Malling. She was discharged 11/10/15 on oral antibiotics.   She is a Hemodialysis patient with Dialysis Tuesday, Thursday, Saturday's. She reports she is taking a "break" from dialysis. She has discussed this with her Nephrologist.   BP 145/68 mmHg  Pulse 72  Temp(Src) 98.5 F (36.9 C)  Ht 5\' 4"  (1.626 m)  Wt 229 lb 11.2 oz (104.191 kg)  BMI 39.41 kg/m2  SpO2 100%   Wt Readings from Last 3 Encounters:  12/04/15 229 lb 11.2 oz (104.191 kg)  10/30/15 226 lb 10.1 oz (102.8 kg)  08/08/15 250 lb (113.4 kg)    No chief complaint on file.

## 2015-12-04 ENCOUNTER — Ambulatory Visit
Admission: RE | Admit: 2015-12-04 | Discharge: 2015-12-04 | Disposition: A | Discharge status: Home / Self Care | Payer: Medicare Other | Source: Ambulatory Visit | Attending: Radiation Oncology | Admitting: Radiation Oncology

## 2015-12-04 ENCOUNTER — Encounter: Payer: Self-pay | Admitting: *Deleted

## 2015-12-04 VITALS — BP 145/68 | HR 72 | Temp 98.5°F | Ht 64.0 in | Wt 229.7 lb

## 2015-12-04 DIAGNOSIS — E119 Type 2 diabetes mellitus without complications: Secondary | ICD-10-CM | POA: Diagnosis not present

## 2015-12-04 DIAGNOSIS — E039 Hypothyroidism, unspecified: Secondary | ICD-10-CM | POA: Insufficient documentation

## 2015-12-04 DIAGNOSIS — K219 Gastro-esophageal reflux disease without esophagitis: Secondary | ICD-10-CM | POA: Insufficient documentation

## 2015-12-04 DIAGNOSIS — K635 Polyp of colon: Secondary | ICD-10-CM | POA: Diagnosis not present

## 2015-12-04 DIAGNOSIS — I10 Essential (primary) hypertension: Secondary | ICD-10-CM | POA: Insufficient documentation

## 2015-12-04 DIAGNOSIS — Z87442 Personal history of urinary calculi: Secondary | ICD-10-CM | POA: Insufficient documentation

## 2015-12-04 DIAGNOSIS — E669 Obesity, unspecified: Secondary | ICD-10-CM | POA: Insufficient documentation

## 2015-12-04 DIAGNOSIS — C021 Malignant neoplasm of border of tongue: Secondary | ICD-10-CM

## 2015-12-04 DIAGNOSIS — C029 Malignant neoplasm of tongue, unspecified: Secondary | ICD-10-CM | POA: Diagnosis present

## 2015-12-04 NOTE — Progress Notes (Signed)
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 female who  presented with a neck tenderness (left) and left sided tongue/ear soreness. She was given ABX for this and magic mouthwash by her PCP.  Subsequently, the patient saw Dr. Benjamine Mola who performed biopsy.  Biopsy of Left lateral tongue on 06-08-15 revealed: Invasive ulcerated keratinzing squamous cell carcinoma.  Patient saw Dr Alvy Bimler and has many combordities including renal failure which make her a poor candidate for systemic therapy.   PET scan on 06-30-15 showed 1. Hypermetabolic lesion in the posterior LEFT tongue consistent with primary carcinoma. 2. Probable ipsilateral level IIa nodal metastasis. 3. No contralateral or distant nodal metastasis. 4. Moderate LEFT effusion small RIGHT effusion  She was seen by Dr Vicie Mutters at [facility] for surgery. This was delayed due to comorbidities.  2 mo ago, she underwent left partial glossectomy and left selective neck dissections which revealed a 2.5 cm tumor with negative margins by at least 71mm.  Well to moderately differentiated squamous cell carcinoma. 1/21 nodes were positive.  There was no LVSI, PNI or ECE.   Nutrition Status Yes No Comments  Weight changes? [X]  [ ]  She reports a 50 lb weight loss since September 2016  Swallowing concerns? [X]  [ ]  She is eating less bread, because it becomes too "gummy" to swallow. She does have taste changes.  PEG? [ ]  [X]     Referrals Yes No Comments  Social Work? [ ]  [X]    Dentistry? [X]  [ ]  Dr. Enrique Sack 06/19/15  Swallowing therapy? [x ] [ ]  in [facility] system    Nutrition? [X]  [ ]  Dory Peru 06/22/15  Med/Onc? [X]  [ ]  Dr. Alvy Bimler 06/16/15   Safety Issues Yes No Comments  Prior radiation? [ ]  [X]    Pacemaker/ICD? [ ]  [X]    Possible current pregnancy? [ ]  [X]    Is the patient on methotrexate? [ ]  [X]     She saw Dr. Vicie Mutters several days ago, and he felt like she was healing well. She has an appointment to see him again in 2 months.   Current Complaints / other details:  She presented to a [facility] Emergency Department on 11/07/15 with concerns for postoperative infection. Temperature of 100 upon arrival. She has evidence of cellulitis and purulent drainage from her incision site. She was then transferred to [facility] because her ENT is Dr. Fenton Malling. She was discharged 11/10/15 on oral antibiotics.   She is a Hemodialysis patient with Dialysis Tuesday, Thursday, Saturday's. She reports she is taking a "break" from dialysis. She has discussed this with her Nephrologist  She reports that Dr. Vicie Mutters was not sure if RT would be warranted but he wanted her to see me to discuss.     PREVIOUS RADIATION THERAPY: No  PAST MEDICAL HISTORY:  has a past medical history of Diabetes mellitus; Hypertension; Hypothyroidism; Cataract; ESRD (end stage renal disease) ([place]) (09/2015); Back pain; Elbow fracture, right; Obesity; Depression; Hyperplastic colon polyp (2001); Pap smear for cervical cancer screening; Anemia; Sleep apnea; Pneumonia; GERD (gastroesophageal reflux disease); Arthritis; Nephrolithiasis (11/2007, 01/2012); Cancer ([place]) (06/08/15 bx); Acute pulmonary edema ([place]) (08/03/2015); and Anorectal ulcer (10/23/2015).  PAST SURGICAL HISTORY: Past Surgical History  Procedure Laterality Date  . Cataract extraction Left   . Lithotripsy  11/2007  . Dilation and curettage of uterus    . Av fistula placement Left 05/12/2015    Procedure: ARTERIOVENOUS (AV) FISTULA CREATION;  Surgeon: Serafina Mitchell, MD;  Location: [place];  Service:  Vascular;  Laterality: Left;  . Av fistula placement  05/12/15    left fore arm  . Flexible sigmoidoscopy N/A 10/23/2015    Procedure: FLEXIBLE SIGMOIDOSCOPY;  Surgeon: Gatha Mayer, MD;  Location: [place];  Service: Endoscopy;  Laterality: N/A;  . Glossectomy Left 10/06/15    partial gossectomy and neck dissection at [facility].  1/18 neck nodes positive. SCC of tongue    FAMILY HISTORY: family history includes Diabetes in her mother; Heart disease in her mother; Hypertension in her mother.  SOCIAL HISTORY:  reports that she has never smoked. She has never used smokeless tobacco. She reports that she does not drink alcohol or use illicit drugs.  ALLERGIES: Review of patient's allergies indicates no known allergies.  MEDICATIONS:  Current Outpatient Prescriptions  Medication Sig Dispense Refill  . cetirizine (ZYRTEC) 10 MG tablet Take 10 mg by mouth daily as needed for allergies.   5  . Cholecalciferol (VITAMIN D3) 2000 UNITS TABS Take 1 capsule by mouth daily.    . Cyanocobalamin (VITAMIN B-12) 1000 MCG SUBL Place 1 tablet under the tongue daily.    . DULoxetine (CYMBALTA) 30 MG capsule Take 30-60 mg by mouth See admin instructions. Pt takes 30mg  in am and 60mg  in pm    . fluticasone (FLONASE) 50 MCG/ACT nasal spray Place 1 spray into both nostrils 2 (two) times daily.    Marland Kitchen HYDROcodone-acetaminophen (NORCO) 10-325 MG tablet Take 1 tablet by mouth every 6 (six) hours as needed. (Patient taking differently: Take 1 tablet by mouth every 6 (six) hours as needed for moderate pain. ) 20 tablet 0  . hydrocortisone-pramoxine (PROCTOFOAM-HC) rectal foam Place 1 applicator rectally 2 (two) times daily. 10 g 0  . insulin glargine (LANTUS) 100 UNIT/ML injection Inject 0.25 mLs (25 Units total) into the skin at bedtime. 10 mL 11  . levothyroxine (SYNTHROID, LEVOTHROID) 150 MCG tablet Take 150 mcg by mouth daily.      . metoprolol (LOPRESSOR) 50 MG tablet Take 50 mg by mouth 2 (two) times  daily.    . polyethylene glycol (MIRALAX) packet Take 17 g by mouth daily. 30 each 1  . ranitidine (ZANTAC) 300 MG tablet Take 300 mg by mouth daily as needed for heartburn.    . vitamin C (ASCORBIC ACID) 500 MG tablet Take 500 mg by mouth daily.    Marland Kitchen aspirin 81 MG tablet Take 81 mg by mouth daily. Reported on 12/04/2015    . calcium carbonate (TUMS - DOSED IN MG ELEMENTAL CALCIUM) 500 MG chewable tablet Chew 2 tablets by mouth 4 (four) times daily. Reported on 12/04/2015     No current facility-administered medications for this encounter.    REVIEW OF SYSTEMS:  Notable for that above.   PHYSICAL EXAM:  height is 5\' 4"  (1.626 m) and weight is 229 lb 11.2 oz (104.191 kg). Her temperature is 98.5 F (36.9 C). Her blood pressure is 145/68 and her pulse is 72. Her oxygen saturation is 100%.   General: Alert and oriented, in no acute distress; speech is understandable HEENT: Head is normocephalic. Extraocular movements are intact. Oropharynx is notable for no lesions. Oral cavity -  deficit in lateral left tongue, healed from surgery. Neck: Neck is notable for satisfactory healing over left neck scar with some mild lymphedema  Psychiatric: Judgment and insight are intact. Affect is appropriate.   ECOG = 2  0 - Asymptomatic (Fully active, able to carry on all predisease activities without restriction)  1 - Symptomatic but completely ambulatory (Restricted in physically strenuous activity but ambulatory and able to carry out work of a light or sedentary nature. For example, light housework, office work)  2 - Symptomatic, <50% in bed during the day (Ambulatory and capable of all self care but unable to carry out any work activities. Up and about more than 50% of waking hours)  3 - Symptomatic, >50% in bed, but not bedbound (Capable of only limited self-care, confined to bed or chair 50% or more of waking hours)  4 - Bedbound (Completely disabled. Cannot carry on any self-care. Totally confined to  bed or chair)  5 - Death   Eustace Pen MM, Creech RH, Tormey DC, et al. [phone]). "Toxicity and response criteria of the [facility]". [place] Oncol. 5 (6): 649-55   LABORATORY DATA:  Lab Results  Component Value Date   WBC 22.5* 11/07/2015   HGB 9.4* 11/08/2015   HCT 29.9* 11/08/2015   MCV 92.4 11/07/2015   PLT 610* 11/07/2015   CMP     Component Value Date/Time   NA 138 11/07/2015 2335   K 3.9 11/07/2015 2335   CL 98* 11/07/2015 2335   CO2 27 11/07/2015 2335   GLUCOSE 131* 11/07/2015 2335   BUN 32* 11/07/2015 2335   CREATININE 5.13* 11/07/2015 2335   CALCIUM 9.0 11/07/2015 2335   PROT 6.9 11/07/2015 2335   ALBUMIN 2.4* 11/07/2015 2335   AST 22 11/07/2015 2335   ALT 13* 11/07/2015 2335   ALKPHOS 106 11/07/2015 2335   BILITOT 0.8 11/07/2015 2335   GFRNONAA 8* 11/07/2015 2335   GFRAA 9* 11/07/2015 2335         RADIOGRAPHY: Dg Chest 2 View  11/08/2015  CLINICAL DATA:  65 year old female with fever. History of recent neck surgery EXAM: CHEST  2 VIEW COMPARISON:  Radiograph dated 10/25/2015 FINDINGS: Two views of the chest do not demonstrate a focal consolidation. There is no pleural effusion or pneumothorax. The cardiac silhouette is within normal limits. Surgical clips noted at the base of the neck on the left. IMPRESSION: No active cardiopulmonary disease. Electronically Signed   By: Anner Crete M.D.   On: 11/08/2015 00:49   Ct Soft Tissue Neck Wo Contrast  11/08/2015  CLINICAL DATA:  Status post partial glossectomy and nodal dissection February 2017 with swelling and drainage at surgical site. History of head and neck cancer, hypertension, diabetes, dialysis. EXAM: CT NECK WITHOUT CONTRAST TECHNIQUE: Multidetector CT imaging of the neck was performed following the standard protocol without intravenous contrast. COMPARISON:  PET-CT June 30, 2015 FINDINGS: Pharynx and larynx: Status post apparent LEFT partial glossectomy with surgical clip. Limited  assessment due to streak artifact from dental amalgam. Airways patent. Normal appearance the larynx. Salivary glands: Fatty replaced parotid glands. Status post probable LEFT submandibular gland resection, with irregular soft tissue within the LEFT neck, skin staples, subcutaneous gas and fat stranding. Fat stranding extends into the LEFT carotid space, with thickened LEFT platysma. Mildly thickened RIGHT platysma with fat stranding extending to the RIGHT submandibular space. Thyroid: Normal. Lymph nodes: Status post LEFT upper neck nodal dissection. Multiple round, pathologic appearing lymph nodes measuring up to  9 mm, LEFT level 4, with small rounded LEFT supraclavicular lymph nodes. Assessment limited by lack of contrast. Vascular: Mild calcific atherosclerosis the carotid bulbs, limited assessment by noncontrast CT. Limited intracranial: Normal. Visualized orbits: Status post LEFT ocular lens implant, otherwise unremarkable. Mastoids and visualized paranasal sinuses: Well-aerated. Skeleton: No destructive bony lesions. Upper chest: Well aerated.  No superior mediastinal lymphadenopathy. IMPRESSION: Limited noncontrast CT neck. Extensive recent postoperative and post treatment changes of LEFT neck, status post probable LEFT submandibular gland resection and nodal dissection, with fat stranding and small amount of subcutaneous gas, superimposed infection is possible. Status post apparent partial LEFT glossectomy . Pathologic appearing LEFT neck lymphadenopathy, progressed from prior PET-CT. Patent airway. Electronically Signed   By: Elon Alas M.D.   On: 11/08/2015 02:14      IMPRESSION/PLAN: tongue cancer, post op We reviewed her path.  While her margins are somewhat close, there is only a single + node, and no ECE, LVSI, or PNI.  Given her significant comorbidities and lack of risk factors, I do not think the morbidity of adjuvant RT is warranted. I recommend close surveillance with ENT  surgeon.  She will call SLP to resume swallowing and speech therapy and continue following with Dr Vicie Mutters.  Also I advised her to resume dialysis per recommendations of her nephrologist. I will see her back PRN. I wished her the best.  Over 25 minutes spent face to face, over 50% on counseling and care coordination.  __________________________________________   Eppie Gibson, MD

## 2015-12-04 NOTE — Addendum Note (Signed)
Encounter addended by: Ernst Spell, RN on: 12/04/2015 12:58 PM<BR>     Documentation filed: Charges VN

## 2015-12-04 NOTE — Progress Notes (Signed)
  Oncology Nurse Navigator Documentation  Navigator Location: [facility]-Med Onc (12/04/15 0820) Navigator Encounter Type: Follow-up Appt (12/04/15 0820)           Patient Visit Type: RadOnc;Follow-up (12/04/15 0820)     Met with Ms. Hartinger during post-surgical follow-up with Dr. Isidore Moos.  She reported:  Was seen by Dr. Vicie Mutters on Wednesday, noted he was pleased with status of post-surgical incision.  She will see him again on 02/03/16.  With the exception of breads, she is able to eat whatever she chooses.  Sense of taste is still impaired.  Continues to receive dialysis. She voiced understanding of Dr. Pearlie Oyster discussion of post-surgical pathology, that post-surgical XRT is optional.  With the understanding of path results, discussion of radiation SEs and risks/benefits in the context of the scheduling demands of her dialysis, she indicated she did not want to pursue XRT.   She voiced understanding of Dr. Pearlie Oyster guidance to:  Maintain follow-ups with Dr. Vicie Mutters, that she is available on a PRN basis.  Follow-up with Garald Balding.  She understands I can be contacted with needs/concerns.  Gayleen Orem, RN, BSN, [place] [facility] [place] [phone]                              Time Spent with Patient: 75 (12/04/15 0820)

## 2015-12-07 ENCOUNTER — Telehealth: Payer: Self-pay | Admitting: *Deleted

## 2015-12-07 NOTE — Telephone Encounter (Signed)
  Oncology Nurse Navigator Documentation  Navigator Location: [facility]-Med Onc (12/07/15 1737)   Telephone: Tami Elliott Confirmation/Clarification (12/07/15 1737)       Called Tami Elliott to remind her of her attendance at tomorrow morning's [facility].  Unable to leave VMM on her phone, LVMM on her husband's phone.  Gayleen Orem, RN, BSN, [place] at [place] [phone]                                     Time Spent with Patient: 15 (12/07/15 1737)

## 2015-12-08 ENCOUNTER — Ambulatory Visit: Payer: Medicare Other

## 2015-12-08 ENCOUNTER — Encounter: Payer: Self-pay | Admitting: Nutrition

## 2015-12-09 NOTE — Addendum Note (Signed)
Encounter addended by: Ernst Spell, RN on: 12/09/2015  1:07 PM<BR>     Documentation filed: Charges VN

## 2015-12-10 ENCOUNTER — Telehealth: Payer: Self-pay

## 2015-12-10 DIAGNOSIS — T829XXA Unspecified complication of cardiac and vascular prosthetic device, implant and graft, initial encounter: Secondary | ICD-10-CM

## 2015-12-10 DIAGNOSIS — N186 End stage renal disease: Secondary | ICD-10-CM

## 2015-12-10 NOTE — Telephone Encounter (Signed)
Rec'd call from Dr. Serita Grit office.  Stated Dr. Posey Pronto is requesting an appt. with Dr Trula Slade ASAP, due to poorly maturing left arm AVF, small vein, and difficulty with cannulation.

## 2015-12-11 ENCOUNTER — Encounter (HOSPITAL_COMMUNITY): Payer: Self-pay

## 2015-12-11 ENCOUNTER — Encounter: Payer: Self-pay | Admitting: Surgery

## 2015-12-11 NOTE — Telephone Encounter (Signed)
Spoke to pt to sch appts. Lab 12/11/15 at 2 and md on 12/14/15 at 3. Lm w/ Adonis Brook at dr. patel's office to inform them of appt [phone].

## 2015-12-14 ENCOUNTER — Ambulatory Visit: Payer: Self-pay | Admitting: Surgery

## 2015-12-18 ENCOUNTER — Encounter: Payer: Self-pay | Admitting: Surgery

## 2015-12-21 ENCOUNTER — Encounter: Payer: Self-pay | Admitting: *Deleted

## 2015-12-21 NOTE — Progress Notes (Signed)
[place] Psychosocial Distress Screening Clinical Social Work  Clinical Social Work was referred by distress screening protocol.  The patient scored a 6 on the Psychosocial Distress Thermometer which indicates moderate distress. Patient did not attend scheduled appointment at [facility]. Clinical Social Worker attempted to contact patient by phone to assess for distress and other psychosocial needs. CSW requested patient return call.  ONCBCN DISTRESS SCREENING 12/04/2015  Screening Type Initial Screening  Distress experienced in past week (1-10) 6  Practical problem type Housing;Food  Family Problem type Partner;Children  Emotional problem type Depression;Nervousness/Anxiety;Adjusting to illness  Spiritual/Religous concerns type Loss of sense of purpose  Physical Problem type Pain;Sleep/insomnia;Getting around;Skin dry/itchy;Talking  Physician notified of physical symptoms Yes  Referral to clinical social work    Polo Riley, MSW, LCSW, OSW-C Clinical Social Worker [facility] [phone]

## 2015-12-23 ENCOUNTER — Encounter: Payer: Self-pay | Admitting: Podiatry

## 2015-12-23 ENCOUNTER — Ambulatory Visit (INDEPENDENT_AMBULATORY_CARE_PROVIDER_SITE_OTHER): Payer: Medicare Other | Admitting: Podiatry

## 2015-12-23 DIAGNOSIS — E1140 Type 2 diabetes mellitus with diabetic neuropathy, unspecified: Secondary | ICD-10-CM

## 2015-12-23 DIAGNOSIS — B351 Tinea unguium: Secondary | ICD-10-CM | POA: Diagnosis not present

## 2015-12-23 DIAGNOSIS — Z794 Long term (current) use of insulin: Secondary | ICD-10-CM

## 2015-12-23 DIAGNOSIS — M79676 Pain in unspecified toe(s): Secondary | ICD-10-CM | POA: Diagnosis not present

## 2015-12-23 NOTE — Progress Notes (Signed)
Patient ID: Tami Elliott, female   DOB: [birth year], 65 y.o.   MRN: [id number] Complaint:  Visit Type: Patient returns to my office for continued preventative foot care services. Complaint: Patient states" my nails have grown long and thick and become painful to walk and wear shoes" Patient has been diagnosed with DM with neuropathy.. The patient presents for preventative foot care services. No changes to ROS.  Patient is now on dialysis.  Podiatric Exam: Vascular: dorsalis pedis and posterior tibial pulses are palpable bilateral. Capillary return is immediate. Temperature gradient is WNL. Skin turgor WNL  Sensorium: Diminished  Semmes Weinstein monofilament test. Normal tactile sensation bilaterally. Nail Exam: Pt has thick disfigured discolored nails with subungual debris noted bilateral entire nail hallux through fifth toenails Ulcer Exam: There is no evidence of ulcer or pre-ulcerative changes or infection. Orthopedic Exam: Muscle tone and strength are WNL. No limitations in general ROM. No crepitus or effusions noted. Foot type and digits show no abnormalities. Bony prominences are unremarkable. Skin: No Porokeratosis. No infection or ulcers  Diagnosis:  Onychomycosis, , Pain in right toe, pain in left toes, Diabetic neuropathy  Treatment & Plan Procedures and Treatment: Consent by patient was obtained for treatment procedures. The patient understood the discussion of treatment and procedures well. All questions were answered thoroughly reviewed. Debridement of mycotic and hypertrophic toenails, 1 through 5 bilateral and clearing of subungual debris. No ulceration, no infection noted.  Return Visit-Office Procedure: Patient instructed to return to the office for a follow up visit 3 months for continued evaluation and treatment. RTC 3 mos   [facility]

## 2015-12-24 ENCOUNTER — Ambulatory Visit (HOSPITAL_COMMUNITY)
Admission: RE | Admit: 2015-12-24 | Discharge: 2015-12-24 | Disposition: A | Discharge status: Home / Self Care | Payer: Medicare Other | Source: Ambulatory Visit | Attending: Vascular Surgery | Admitting: Vascular Surgery

## 2015-12-24 ENCOUNTER — Encounter: Payer: Self-pay | Admitting: Internal Medicine

## 2015-12-24 DIAGNOSIS — Y832 Surgical operation with anastomosis, bypass or graft as the cause of abnormal reaction of the patient, or of later complication, without mention of misadventure at the time of the procedure: Secondary | ICD-10-CM | POA: Insufficient documentation

## 2015-12-24 DIAGNOSIS — T829XXA Unspecified complication of cardiac and vascular prosthetic device, implant and graft, initial encounter: Secondary | ICD-10-CM | POA: Insufficient documentation

## 2015-12-24 DIAGNOSIS — N186 End stage renal disease: Secondary | ICD-10-CM | POA: Diagnosis not present

## 2015-12-28 ENCOUNTER — Encounter: Payer: Self-pay | Admitting: Surgery

## 2015-12-28 ENCOUNTER — Ambulatory Visit (INDEPENDENT_AMBULATORY_CARE_PROVIDER_SITE_OTHER): Payer: Medicare Other | Admitting: Surgery

## 2015-12-28 ENCOUNTER — Ambulatory Visit: Payer: Self-pay

## 2015-12-28 VITALS — BP 125/72 | HR 72 | Temp 98.6°F | Resp 14 | Ht 64.0 in | Wt 230.2 lb

## 2015-12-28 DIAGNOSIS — N186 End stage renal disease: Secondary | ICD-10-CM | POA: Diagnosis not present

## 2015-12-28 DIAGNOSIS — Z992 Dependence on renal dialysis: Secondary | ICD-10-CM

## 2015-12-28 NOTE — Progress Notes (Signed)
.   Vascular and Vein Specialist of [place]  Patient name: Tami Elliott MRN: [id number] DOB: 1951-07-02 Sex: female  REASON FOR VISIT: Follow-up  HPI: Tami Elliott is a 65 y.o. female who is status post left radiocephalic fistula creation on 05/12/2015.  My original plan was to proceed with elevation of the fistula from the mid forearm to the elbow, however she developed head and neck cancer which required surgery, and so our procedure was delayed.  She was started on dialysis prior to her cancer surgery.  She had significant difficulties tolerating dialysis and exited took herself off recently.  She is voiding on her own and has not required to go back on to dialysis.  She has also been hospitalized for a GI bleed.  Past Medical History  Diagnosis Date  . Diabetes mellitus   . Hypertension   . Hypothyroidism   . Cataract     Left eye( Dr. Katy Fitch, patient uninsured so she can't  get  an intervention)  . ESRD (end stage renal disease) ([place]) 09/2015    started HD in  09/2015. nephrolithiasis, s/p removal 12/05/07  . Back pain     myofascial, excacerberated in 6-7/09, 9/09- required narcotics at those times  . Elbow fracture, right   . Obesity   . Depression     better on nortriptyline  . Hyperplastic colon polyp 2001    f/u colonoscopy 11/23/06 was normal( Dr. Carlean Purl), falls into normal risk screening( next colonoscopy due in 2018)  . Pap smear for cervical cancer screening     12/14/2005: normal, evidence of candida. 05/16/2007: normal, benign reperative changes.  . Anemia   . Sleep apnea   . Pneumonia     as a child  . GERD (gastroesophageal reflux disease)   . Arthritis   . Nephrolithiasis 11/2007, 01/2012  . Cancer ([place]) 06/08/15 bx    SCC of left lateral tongue   . Acute pulmonary edema ([place]) 08/03/2015  . Anorectal ulcer 10/23/2015    and thrombosed internal hemorrhoids. with hematochezia    Family History  Problem Relation Age of Onset  . Diabetes Mother   .  Heart disease Mother     before age 75  . Hypertension Mother     SOCIAL HISTORY: Social History  Substance Use Topics  . Smoking status: Never Smoker   . Smokeless tobacco: Never Used  . Alcohol Use: No    No Known Allergies  Current Outpatient Prescriptions  Medication Sig Dispense Refill  . ACCU-CHEK AVIVA PLUS test strip CHECK BLOOD SUGAR D  5  . amLODipine (NORVASC) 10 MG tablet TK 1 T PO D  12  . cetirizine (ZYRTEC) 10 MG tablet Take 10 mg by mouth daily as needed for allergies.   5  . Cholecalciferol (VITAMIN D) 2000 units tablet Take by mouth.    . Cyanocobalamin (VITAMIN B-12) 1000 MCG SUBL Place 1 tablet under the tongue daily.    . DULoxetine (CYMBALTA) 30 MG capsule TK 1 C PO QAM  5  . fluticasone (FLONASE) 50 MCG/ACT nasal spray Place 1 spray into both nostrils 2 (two) times daily.    Marland Kitchen HYDROcodone-acetaminophen (NORCO) 10-325 MG tablet Take 1 tablet by mouth every 6 (six) hours as needed. (Patient taking differently: Take 1 tablet by mouth every 6 (six) hours as needed for moderate pain. ) 20 tablet 0  . hydrocortisone-pramoxine (PROCTOFOAM-HC) rectal foam Place 1 applicator rectally 2 (two) times daily. 10 g 0  . insulin glargine (  LANTUS) 100 UNIT/ML injection Inject 0.25 mLs (25 Units total) into the skin at bedtime. 10 mL 11  . levothyroxine (SYNTHROID, LEVOTHROID) 150 MCG tablet Take 150 mcg by mouth daily.      . metoprolol (LOPRESSOR) 50 MG tablet Take 50 mg by mouth 2 (two) times daily.    . polyethylene glycol (MIRALAX) packet Take 17 g by mouth daily. 30 each 1  . potassium chloride SA (K-DUR,KLOR-CON) 20 MEQ tablet   5  . ranitidine (ZANTAC) 300 MG tablet Take 300 mg by mouth daily as needed for heartburn.    . vitamin C (ASCORBIC ACID) 500 MG tablet Take 500 mg by mouth daily.     No current facility-administered medications for this visit.    REVIEW OF SYSTEMS:  [X]  denotes positive finding, [ ]  denotes negative finding Cardiac  Comments:  Chest pain  or chest pressure:    Shortness of breath upon exertion: x   Short of breath when lying flat: x   Irregular heart rhythm:        Vascular    Pain in calf, thigh, or hip brought on by ambulation: x   Pain in feet at night that wakes you up from your sleep:     Blood clot in your veins:    Leg swelling:         Pulmonary    Oxygen at home: x   Productive cough:     Wheezing:         Neurologic    Sudden weakness in arms or legs:     Sudden numbness in arms or legs:     Sudden onset of difficulty speaking or slurred speech:    Temporary loss of vision in one eye:     Problems with dizziness:         Gastrointestinal    Blood in stool:     Vomited blood:         Genitourinary    Burning when urinating:     Blood in urine:        Psychiatric    Major depression:         Hematologic    Bleeding problems:    Problems with blood clotting too easily:        Skin    Rashes or ulcers:        Constitutional    Fever or chills:      PHYSICAL EXAM: Filed Vitals:   12/28/15 1525  BP: 125/72  Pulse: 72  Temp: 98.6 F (37 C)  TempSrc: Oral  Resp: 14  Height: 5\' 4"  (1.626 m)  Weight: 230 lb 3.2 oz (104.418 kg)  SpO2: 100%    GENERAL: The patient is a well-nourished female, in no acute distress. The vital signs are documented above. CARDIAC: There is a regular rate and rhythm.  VASCULAR: I can palpate the fistula from the mid forearm to the wrist, however it becomes difficult to palpate it in the upper arm PULMONARY: There is good air exchange bilaterally without wheezing or rales. NEUROLOGIC: No focal weakness or paresthesias are detected. SKIN: There are no ulcers or rashes noted. PSYCHIATRIC: The patient has a normal affect.  DATA:  I have again reviewed her fistula ultrasound this shows an excellent diameter fistula with no intraluminal filling defects.  MEDICAL ISSUES: I still feel that the patient's fistula is a little on the deep side from the mid forearm up  to the elbow.  On her prior  ultrasound there were competing branches in this area as well.  Therefore, I have recommended proceeding with fistula elevation and possible branch ligation.  The incision will start in the mid forearm so that if she does go on dialysis again during the convalescent period, her fistula could be cannulated near the arterial venous anastomosis.  This is been scheduled for Thursday, June 1    [name] Vascular and Vein Specialists of [place]: [phone]

## 2015-12-29 ENCOUNTER — Other Ambulatory Visit: Payer: Self-pay

## 2015-12-31 ENCOUNTER — Encounter: Payer: Self-pay | Admitting: Internal Medicine

## 2015-12-31 ENCOUNTER — Ambulatory Visit (INDEPENDENT_AMBULATORY_CARE_PROVIDER_SITE_OTHER): Payer: Medicare Other | Admitting: Internal Medicine

## 2015-12-31 VITALS — BP 120/64 | HR 76 | Ht 63.5 in | Wt 229.0 lb

## 2015-12-31 DIAGNOSIS — K626 Ulcer of anus and rectum: Secondary | ICD-10-CM

## 2015-12-31 DIAGNOSIS — D62 Acute posthemorrhagic anemia: Secondary | ICD-10-CM

## 2015-12-31 NOTE — Progress Notes (Signed)
Subjective:    Patient ID: Tami Elliott, female    DOB: 04-May-1951, 65 y.o.   MRN: [id number] Cc: f/u rectal ulcer and bleeding HPI  This is a nice lady that I saw in March 2017 - admitted with profound GI bleeding and anemia - had been on narcotics after tongue cancer surgery and became constipated. Sigmoidoscopy showed rectal ulcers and hemorrhoids and no proximal blood./ She is ok now - no constipation on MiraLax and no bleeding. Biopsies were benign. Has f/u Dr. Posey Pronto at nephrology. HD on hold while waiting to get a new fistula.  No Known Allergies Outpatient Prescriptions Prior to Visit  Medication Sig Dispense Refill  . ACCU-CHEK AVIVA PLUS test strip CHECK BLOOD SUGAR D  5  . amLODipine (NORVASC) 10 MG tablet TK 1 T PO D  12  . cetirizine (ZYRTEC) 10 MG tablet Take 10 mg by mouth daily as needed for allergies.   5  . Cholecalciferol (VITAMIN D) 2000 units tablet Take by mouth.    . Cyanocobalamin (VITAMIN B-12) 1000 MCG SUBL Place 1 tablet under the tongue daily.    . DULoxetine (CYMBALTA) 30 MG capsule TK 1 C PO QAM  5  . fluticasone (FLONASE) 50 MCG/ACT nasal spray Place 1 spray into both nostrils 2 (two) times daily.    Marland Kitchen HYDROcodone-acetaminophen (NORCO) 10-325 MG tablet Take 1 tablet by mouth every 6 (six) hours as needed. (Patient taking differently: Take 1 tablet by mouth every 6 (six) hours as needed for moderate pain. ) 20 tablet 0  . hydrocortisone-pramoxine (PROCTOFOAM-HC) rectal foam Place 1 applicator rectally 2 (two) times daily. 10 g 0  . insulin glargine (LANTUS) 100 UNIT/ML injection Inject 0.25 mLs (25 Units total) into the skin at bedtime. 10 mL 11  . levothyroxine (SYNTHROID, LEVOTHROID) 150 MCG tablet Take 150 mcg by mouth daily.      . metoprolol (LOPRESSOR) 50 MG tablet Take 50 mg by mouth 2 (two) times daily.    . polyethylene glycol (MIRALAX) packet Take 17 g by mouth daily. 30 each 1  . ranitidine (ZANTAC) 300 MG tablet Take 300 mg by mouth daily as  needed for heartburn.    . vitamin C (ASCORBIC ACID) 500 MG tablet Take 500 mg by mouth daily.    . potassium chloride SA (K-DUR,KLOR-CON) 20 MEQ tablet   5   No facility-administered medications prior to visit.   Past Medical History  Diagnosis Date  . Diabetes mellitus   . Hypertension   . Hypothyroidism   . Cataract     Left eye( Dr. Katy Fitch, patient uninsured so she can't  get  an intervention)  . ESRD (end stage renal disease) ([place]) 09/2015    started HD in  09/2015. nephrolithiasis, s/p removal 12/05/07  . Back pain     myofascial, excacerberated in 6-7/09, 9/09- required narcotics at those times  . Elbow fracture, right   . Obesity   . Depression     better on nortriptyline  . Hyperplastic colon polyp 2001    f/u colonoscopy 11/23/06 was normal( Dr. Carlean Purl), falls into normal risk screening( next colonoscopy due in 2018)  . Pap smear for cervical cancer screening     12/14/2005: normal, evidence of candida. 05/16/2007: normal, benign reperative changes.  . Anemia   . Sleep apnea   . Pneumonia     as a child  . GERD (gastroesophageal reflux disease)   . Arthritis   . Nephrolithiasis 11/2007, 01/2012  . 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 Acute pulmonary edema ([place]) 08/03/2015  . Anorectal ulcer 10/23/2015    and thrombosed internal hemorrhoids. with hematochezia   Past Surgical History  Procedure Laterality Date  . Cataract extraction Left   . Lithotripsy  11/2007  . Dilation and curettage of uterus    . Av fistula placement Left 05/12/2015    Procedure: ARTERIOVENOUS (AV) FISTULA CREATION;  Surgeon: Serafina Mitchell, MD;  Location: [facility];  Service: Vascular;  Laterality: Left;  . Av fistula placement  05/12/15    left fore arm  . Flexible sigmoidoscopy N/A 10/23/2015    Procedure: FLEXIBLE SIGMOIDOSCOPY;  Surgeon: Gatha Mayer, MD;  Location: [facility];  Service: Endoscopy;  Laterality: N/A;  . Glossectomy Left 10/06/15    partial gossectomy  and neck dissection at [facility].  1/18 neck nodes positive. SCC of tongue   Social History   Social History  . Marital Status: Married    Spouse Name: N/A  . Number of Children: 2  . Years of Education: N/A   Occupational History  . retired    Social History Main Topics  . Smoking status: Never Smoker   . Smokeless tobacco: Never Used  . Alcohol Use: No  . Drug Use: No  . Sexual Activity: Not Asked   Other Topics Concern  . None   Social History Narrative   Married( 2nd marriage). Husband awaiting back surgery. BE AWARE THAT HUSBAND BROKE PAIN CONTRACT WITH [facility] SEVERAL TIMES!   Patient unemployed. Difficulty finding a jobb/c she is partially blind( but not blind enough to have disability). She used to work in [employer].. The grandson's mother basically   Son 57 y/o , unemployed and grandson lives with her. The grandson's mother basically dropped the child off when he was 58 y/o. They rarely hear from her They moved to an apartment from their house in 12/2008.   Family History  Problem Relation Age of Onset  . Diabetes Mother   . Heart disease Mother     before age 80  . Hypertension Mother    Review of Systems Improving speech and healing after surgery    Objective:   Physical Exam BP 120/64 mmHg  Pulse 76  Ht 5' 3.5" (1.613 m)  Wt 229 lb (103.874 kg)  BMI 39.92 kg/m2 NAD    Assessment & Plan:   Encounter Diagnoses  Name Primary?  . Rectal ulcers with hemorrhage Yes  . Acute blood loss anemia    No need for further evaluation now. Screening colonoscopy next year (last 2008) Iron supplementation and anemia f/u through nephrology.  See me prn otherwise  I appreciate the opportunity to care for this patient. [id number] Carmelia Roller, MD Elmarie Shiley, MD

## 2015-12-31 NOTE — Patient Instructions (Signed)
   We will put you in the system for a colonoscopy recall 11/2016.    I appreciate the opportunity to care for you.

## 2016-01-19 ENCOUNTER — Telehealth: Payer: Self-pay | Admitting: Internal Medicine

## 2016-01-19 ENCOUNTER — Ambulatory Visit: Payer: Self-pay | Admitting: Gastroenterology

## 2016-01-19 DIAGNOSIS — D6489 Other specified anemias: Secondary | ICD-10-CM

## 2016-01-19 DIAGNOSIS — K921 Melena: Secondary | ICD-10-CM

## 2016-01-19 NOTE — Telephone Encounter (Signed)
I agree that iron likely causing this Reasonable to check a CBC re: black stools, anemia

## 2016-01-19 NOTE — Telephone Encounter (Signed)
Patient reports that she is having black stools.  She wanted Dr. Carlean Purl to know, as she is concerned.  We discussed that iron supplements will turn stools black.  She would like me to run this by Dr. Carlean Purl.  Please advise

## 2016-01-19 NOTE — Telephone Encounter (Signed)
Left message for patient to call back   Labs entered

## 2016-01-20 NOTE — Telephone Encounter (Signed)
Patient notified

## 2016-01-21 ENCOUNTER — Telehealth: Payer: Self-pay

## 2016-01-21 NOTE — Telephone Encounter (Signed)
Received call from Tami Elliott stating that she needed to cancel surgery that is scheduled for tomorrow. Patient is scheduled for Superficialization of Left arm Brachiocephalic AVF by Dr. Trula Slade. Patient states that she is supposed to see her Gastroenterologist tomorrow for bloodwork. Tami Elliott states, "I will call you back to reschedule this surgery because I don't know what they're going to do after tomorrow." Instructed patient to call our office with any other questions/concerns and/or to reschedule surgery. Patient verbalized understanding. Will make Dr. Trula Slade aware.

## 2016-01-22 ENCOUNTER — Encounter (HOSPITAL_COMMUNITY): Admission: RE | Payer: Self-pay | Source: Ambulatory Visit

## 2016-01-22 ENCOUNTER — Ambulatory Visit (HOSPITAL_COMMUNITY): Admission: RE | Admit: 2016-01-22 | Payer: Medicare Other | Source: Ambulatory Visit | Admitting: Surgery

## 2016-01-22 SURGERY — FISTULA SUPERFICIALIZATION
Anesthesia: Choice | Laterality: Left

## 2016-01-22 NOTE — Therapy (Signed)
[facility] [address] [place], [place], [address] Phone: [phone]   Fax:  [phone]  Patient Details  Name: Tami Elliott MRN: [id number] Date of Birth: 1951/02/08 Referring Provider:  No ref. provider found  Encounter Date: 01/22/2016  SPEECH THERAPY DISCHARGE SUMMARY  Visits from Start of Care: one (evaluation)  Current functional level related to goals / functional outcomes: Pt was seen for evaluation in mid-November 2016. Calls were made to attempt to have pt attend follow up ST. An appointment was ultimately made for 12-08-15 at multi-disciplinary clinic to which pt no-showed. It is assumed she no longer desires ST.   Remaining deficits: Unknown, as pt has not been seen since November 2016.   Education / Equipment: HEP, late effects head/neck radiation on swallowing.  Plan: Patient agrees to discharge.  Patient goals were not met. Patient is being discharged due to not returning since the last visit.  ?????       [facility] 01/22/2016, 2:48 PM  [facility] [address] [place], [place], [address] Phone: [phone]   Fax:  [phone]

## 2016-01-25 ENCOUNTER — Other Ambulatory Visit (INDEPENDENT_AMBULATORY_CARE_PROVIDER_SITE_OTHER): Payer: Medicare Other

## 2016-01-25 DIAGNOSIS — K921 Melena: Secondary | ICD-10-CM | POA: Diagnosis not present

## 2016-01-25 DIAGNOSIS — D6489 Other specified anemias: Secondary | ICD-10-CM | POA: Diagnosis not present

## 2016-01-25 LAB — CBC WITH DIFFERENTIAL/PLATELET
BASOS ABS: 0.1 10*3/uL (ref 0.0–0.1)
Basophils Relative: 0.5 % (ref 0.0–3.0)
Eosinophils Absolute: 0.6 10*3/uL (ref 0.0–0.7)
Eosinophils Relative: 3.7 % (ref 0.0–5.0)
HCT: 29.5 % — ABNORMAL LOW (ref 36.0–46.0)
HEMOGLOBIN: 10 g/dL — AB (ref 12.0–15.0)
LYMPHS PCT: 27.9 % (ref 12.0–46.0)
Lymphs Abs: 4.2 10*3/uL — ABNORMAL HIGH (ref 0.7–4.0)
MCHC: 33.8 g/dL (ref 30.0–36.0)
MCV: 89.7 fl (ref 78.0–100.0)
MONOS PCT: 7.2 % (ref 3.0–12.0)
Monocytes Absolute: 1.1 10*3/uL — ABNORMAL HIGH (ref 0.1–1.0)
NEUTROS PCT: 60.7 % (ref 43.0–77.0)
Neutro Abs: 9.1 10*3/uL — ABNORMAL HIGH (ref 1.4–7.7)
Platelets: 430 10*3/uL — ABNORMAL HIGH (ref 150.0–400.0)
RBC: 3.29 Mil/uL — AB (ref 3.87–5.11)
RDW: 14.3 % (ref 11.5–15.5)
WBC: 14.9 10*3/uL — AB (ref 4.0–10.5)

## 2016-01-26 NOTE — Progress Notes (Signed)
Quick Note:  Hemoglobin up to 10 She should be getting blood counts and iron checked with kidney specialists - please confirm that is so and she should follow with them and see me prn ______

## 2016-02-12 ENCOUNTER — Ambulatory Visit: Payer: Self-pay | Admitting: Gastroenterology

## 2016-03-16 ENCOUNTER — Ambulatory Visit (INDEPENDENT_AMBULATORY_CARE_PROVIDER_SITE_OTHER): Payer: Medicare Other | Admitting: Podiatry

## 2016-03-16 DIAGNOSIS — M79676 Pain in unspecified toe(s): Secondary | ICD-10-CM | POA: Diagnosis not present

## 2016-03-16 DIAGNOSIS — E1140 Type 2 diabetes mellitus with diabetic neuropathy, unspecified: Secondary | ICD-10-CM

## 2016-03-16 DIAGNOSIS — B351 Tinea unguium: Secondary | ICD-10-CM

## 2016-03-16 DIAGNOSIS — Z794 Long term (current) use of insulin: Secondary | ICD-10-CM

## 2016-03-16 NOTE — Progress Notes (Signed)
Patient ID: Tami Elliott, female   DOB: 18-Nov-1950, 65 y.o.   MRN: [id number] Complaint:  Visit Type: Patient returns to my office for continued preventative foot care services. Complaint: Patient states" my nails have grown long and thick and become painful to walk and wear shoes" Patient has been diagnosed with DM with neuropathy.. The patient presents for preventative foot care services. No changes to ROS.  Patient is now on dialysis.  Podiatric Exam: Vascular: dorsalis pedis and posterior tibial pulses are palpable bilateral. Capillary return is immediate. Temperature gradient is WNL. Skin turgor WNL  Sensorium: Diminished  Semmes Weinstein monofilament test. Normal tactile sensation bilaterally. Nail Exam: Pt has thick disfigured discolored nails with subungual debris noted bilateral entire nail hallux through fifth toenails Ulcer Exam: There is no evidence of ulcer or pre-ulcerative changes or infection. Orthopedic Exam: Muscle tone and strength are WNL. No limitations in general ROM. No crepitus or effusions noted. Foot type and digits show no abnormalities. Bony prominences are unremarkable. Skin: No Porokeratosis. No infection or ulcers  Diagnosis:  Onychomycosis, , Pain in right toe, pain in left toes, Diabetic neuropathy  Treatment & Plan Procedures and Treatment: Consent by patient was obtained for treatment procedures. The patient understood the discussion of treatment and procedures well. All questions were answered thoroughly reviewed. Debridement of mycotic and hypertrophic toenails, 1 through 5 bilateral and clearing of subungual debris. No ulceration, no infection noted.  Return Visit-Office Procedure: Patient instructed to return to the office for a follow up visit 3 months for continued evaluation and treatment. RTC 3 mos   [facility]

## 2016-06-08 ENCOUNTER — Ambulatory Visit: Payer: Medicare Other | Admitting: Podiatry

## 2016-06-15 ENCOUNTER — Ambulatory Visit: Payer: Medicare Other | Admitting: Podiatry

## 2016-06-21 DIAGNOSIS — Z945 Skin transplant status: Secondary | ICD-10-CM

## 2016-06-21 DIAGNOSIS — Z9889 Other specified postprocedural states: Secondary | ICD-10-CM

## 2016-06-21 HISTORY — DX: Skin transplant status: Z94.5

## 2016-06-21 HISTORY — DX: Other specified postprocedural states: Z98.890

## 2016-06-21 HISTORY — PX: NECK DISSECTION: SUR422

## 2016-06-21 HISTORY — PX: FREE FLAP RADIAL FOREARM: SHX1678

## 2016-06-21 HISTORY — PX: OTHER SURGICAL HISTORY: SHX169

## 2016-06-21 HISTORY — PX: SKIN GRAFT SPLIT THICKNESS LEG / FOOT: SUR1303

## 2016-07-08 ENCOUNTER — Other Ambulatory Visit (HOSPITAL_COMMUNITY): Payer: Medicare Other

## 2016-07-08 ENCOUNTER — Inpatient Hospital Stay
Admission: RE | Admit: 2016-07-08 | Discharge: 2016-08-01 | Disposition: A | Discharge status: Home Health | Payer: Medicare Other | Source: Ambulatory Visit | Attending: Interventional Radiology | Admitting: Interventional Radiology

## 2016-07-08 DIAGNOSIS — L988 Other specified disorders of the skin and subcutaneous tissue: Secondary | ICD-10-CM

## 2016-07-08 DIAGNOSIS — Z931 Gastrostomy status: Secondary | ICD-10-CM

## 2016-07-08 DIAGNOSIS — J398 Other specified diseases of upper respiratory tract: Secondary | ICD-10-CM

## 2016-07-08 DIAGNOSIS — J9690 Respiratory failure, unspecified, unspecified whether with hypoxia or hypercapnia: Secondary | ICD-10-CM

## 2016-07-08 DIAGNOSIS — R1310 Dysphagia, unspecified: Secondary | ICD-10-CM

## 2016-07-08 DIAGNOSIS — T85598A Other mechanical complication of other gastrointestinal prosthetic devices, implants and grafts, initial encounter: Secondary | ICD-10-CM

## 2016-07-09 ENCOUNTER — Other Ambulatory Visit (HOSPITAL_COMMUNITY): Payer: Medicare Other

## 2016-07-09 ENCOUNTER — Other Ambulatory Visit: Payer: Self-pay

## 2016-07-09 LAB — CBC
HCT: 26.5 % — ABNORMAL LOW (ref 36.0–46.0)
HEMOGLOBIN: 8.4 g/dL — AB (ref 12.0–15.0)
MCH: 28.9 pg (ref 26.0–34.0)
MCHC: 31.7 g/dL (ref 30.0–36.0)
MCV: 91.1 fL (ref 78.0–100.0)
PLATELETS: 397 10*3/uL (ref 150–400)
RBC: 2.91 MIL/uL — AB (ref 3.87–5.11)
RDW: 16.2 % — ABNORMAL HIGH (ref 11.5–15.5)
WBC: 12.8 10*3/uL — ABNORMAL HIGH (ref 4.0–10.5)

## 2016-07-09 LAB — COMPREHENSIVE METABOLIC PANEL
ALK PHOS: 105 U/L (ref 38–126)
ALT: 16 U/L (ref 14–54)
ANION GAP: 10 (ref 5–15)
AST: 26 U/L (ref 15–41)
Albumin: 2.4 g/dL — ABNORMAL LOW (ref 3.5–5.0)
BILIRUBIN TOTAL: 0.5 mg/dL (ref 0.3–1.2)
BUN: 19 mg/dL (ref 6–20)
CALCIUM: 8.7 mg/dL — AB (ref 8.9–10.3)
CO2: 27 mmol/L (ref 22–32)
CREATININE: 3.26 mg/dL — AB (ref 0.44–1.00)
Chloride: 99 mmol/L — ABNORMAL LOW (ref 101–111)
GFR calc non Af Amer: 14 mL/min — ABNORMAL LOW (ref >60)
GFR, EST AFRICAN AMERICAN: 16 mL/min — AB (ref >60)
GLUCOSE: 111 mg/dL — AB (ref 65–99)
Potassium: 3.6 mmol/L (ref 3.5–5.1)
SODIUM: 136 mmol/L (ref 135–145)
TOTAL PROTEIN: 6.9 g/dL (ref 6.5–8.1)

## 2016-07-09 MED ORDER — IOPAMIDOL (ISOVUE-300) INJECTION 61%
INTRAVENOUS | Status: AC
  Administered 2016-07-09: 75 mL
  Filled 2016-07-09: qty 75

## 2016-07-10 LAB — BASIC METABOLIC PANEL
Anion gap: 11 (ref 5–15)
BUN: 29 mg/dL — AB (ref 6–20)
CALCIUM: 8.6 mg/dL — AB (ref 8.9–10.3)
CHLORIDE: 96 mmol/L — AB (ref 101–111)
CO2: 24 mmol/L (ref 22–32)
CREATININE: 4.32 mg/dL — AB (ref 0.44–1.00)
GFR calc non Af Amer: 10 mL/min — ABNORMAL LOW (ref >60)
GFR, EST AFRICAN AMERICAN: 12 mL/min — AB (ref >60)
Glucose, Bld: 81 mg/dL (ref 65–99)
Potassium: 3.7 mmol/L (ref 3.5–5.1)
Sodium: 131 mmol/L — ABNORMAL LOW (ref 135–145)

## 2016-07-11 LAB — BASIC METABOLIC PANEL
ANION GAP: 12 (ref 5–15)
BUN: 38 mg/dL — ABNORMAL HIGH (ref 6–20)
CO2: 24 mmol/L (ref 22–32)
Calcium: 8.8 mg/dL — ABNORMAL LOW (ref 8.9–10.3)
Chloride: 99 mmol/L — ABNORMAL LOW (ref 101–111)
Creatinine, Ser: 4.99 mg/dL — ABNORMAL HIGH (ref 0.44–1.00)
GFR calc Af Amer: 10 mL/min — ABNORMAL LOW (ref >60)
GFR, EST NON AFRICAN AMERICAN: 8 mL/min — AB (ref >60)
GLUCOSE: 114 mg/dL — AB (ref 65–99)
POTASSIUM: 3.5 mmol/L (ref 3.5–5.1)
Sodium: 135 mmol/L (ref 135–145)

## 2016-07-12 LAB — CBC WITH DIFFERENTIAL/PLATELET
Basophils Absolute: 0 10*3/uL (ref 0.0–0.1)
Basophils Relative: 0 %
EOS ABS: 0.3 10*3/uL (ref 0.0–0.7)
Eosinophils Relative: 4 %
HCT: 24.8 % — ABNORMAL LOW (ref 36.0–46.0)
HEMOGLOBIN: 8 g/dL — AB (ref 12.0–15.0)
LYMPHS ABS: 1.8 10*3/uL (ref 0.7–4.0)
LYMPHS PCT: 19 %
MCH: 28.8 pg (ref 26.0–34.0)
MCHC: 32.3 g/dL (ref 30.0–36.0)
MCV: 89.2 fL (ref 78.0–100.0)
Monocytes Absolute: 0.7 10*3/uL (ref 0.1–1.0)
Monocytes Relative: 8 %
NEUTROS PCT: 69 %
Neutro Abs: 6.4 10*3/uL (ref 1.7–7.7)
Platelets: 401 10*3/uL — ABNORMAL HIGH (ref 150–400)
RBC: 2.78 MIL/uL — AB (ref 3.87–5.11)
RDW: 16.2 % — ABNORMAL HIGH (ref 11.5–15.5)
WBC: 9.2 10*3/uL (ref 4.0–10.5)

## 2016-07-12 LAB — RENAL FUNCTION PANEL
ANION GAP: 10 (ref 5–15)
Albumin: 2.5 g/dL — ABNORMAL LOW (ref 3.5–5.0)
BUN: 43 mg/dL — ABNORMAL HIGH (ref 6–20)
CALCIUM: 9.2 mg/dL (ref 8.9–10.3)
CO2: 25 mmol/L (ref 22–32)
Chloride: 100 mmol/L — ABNORMAL LOW (ref 101–111)
Creatinine, Ser: 5.45 mg/dL — ABNORMAL HIGH (ref 0.44–1.00)
GFR calc non Af Amer: 8 mL/min — ABNORMAL LOW (ref >60)
GFR, EST AFRICAN AMERICAN: 9 mL/min — AB (ref >60)
Glucose, Bld: 105 mg/dL — ABNORMAL HIGH (ref 65–99)
Phosphorus: 6.5 mg/dL — ABNORMAL HIGH (ref 2.5–4.6)
Potassium: 3.6 mmol/L (ref 3.5–5.1)
SODIUM: 135 mmol/L (ref 135–145)

## 2016-07-12 LAB — MAGNESIUM: MAGNESIUM: 2.9 mg/dL — AB (ref 1.7–2.4)

## 2016-07-13 LAB — BASIC METABOLIC PANEL
Anion gap: 13 (ref 5–15)
BUN: 48 mg/dL — AB (ref 6–20)
CALCIUM: 9.3 mg/dL (ref 8.9–10.3)
CHLORIDE: 97 mmol/L — AB (ref 101–111)
CO2: 25 mmol/L (ref 22–32)
CREATININE: 5.89 mg/dL — AB (ref 0.44–1.00)
GFR calc Af Amer: 8 mL/min — ABNORMAL LOW (ref >60)
GFR calc non Af Amer: 7 mL/min — ABNORMAL LOW (ref >60)
Glucose, Bld: 81 mg/dL (ref 65–99)
Potassium: 3.7 mmol/L (ref 3.5–5.1)
SODIUM: 135 mmol/L (ref 135–145)

## 2016-07-13 LAB — FERRITIN: FERRITIN: 562 ng/mL — AB (ref 11–307)

## 2016-07-13 LAB — IRON AND TIBC
IRON: 40 ug/dL (ref 28–170)
Saturation Ratios: 23 % (ref 10.4–31.8)
TIBC: 174 ug/dL — ABNORMAL LOW (ref 250–450)
UIBC: 134 ug/dL

## 2016-07-13 LAB — TRANSFERRIN: TRANSFERRIN: 124 mg/dL — AB (ref 192–382)

## 2016-07-13 NOTE — Consult Note (Signed)
Date: 07/13/2016                  Patient Name:  Tami Elliott  MRN: [id number]  DOB: [birth year]  Age / Sex: 66 y.o., female         PCP: Harvie Junior, MD                 Service Requesting Consult: [facility]                 Reason for Consult: ESRD            History of Present Illness: Patient is a 65 y.o. female with medical problems of type 2 diabetes, chronic kidney disease followed by Dr. Graylon Gunning of [place] [facility], obstructive sleep apnea, obesity, tongue cancer, status post partial glossectomy in February is  transferred from [facility].   Patient developed recurrence of tongue cancer a glossectomy, free flap reconstruction and selective neck dissection on November 27 with trach placement. She was transferred to [facility] on 07/08/2016 for further management.  Upon arrival she had a respiratory distress. CT scan of the neck with contrast showed trach to be in a false passage. The tracheostomy was then removed. She has done well since then. Patient has a Dobbhoff tube  Patient has chronic kidney disease and is followed by Dr. Posey Pronto of [place] [facility]. She has a left forearm AV fistula. Apparently, she was started on dialysis in February, but did not do well. She was taken off and she felt better. Dialysis was again started perioperatively at [facility]. She states she has done okay this time with shorter treatments. She underwent CT scan of the neck on November 25 but contrast which probably lowered her residual renal function. Nephrology consult has been requested for continuing dialysis.  Medications: Outpatient medications: Prescriptions Prior to Admission  Medication Sig Dispense Refill Last Dose  . ACCU-CHEK AVIVA PLUS test strip CHECK BLOOD SUGAR D  5 Taking  . amLODipine (NORVASC) 10 MG tablet TK 1 T PO D  12 Taking  . cetirizine (ZYRTEC) 10 MG tablet Take 10 mg by mouth daily as needed for allergies.   5 Taking  . Cholecalciferol  (VITAMIN D) 2000 units tablet Take by mouth.   Taking  . Cyanocobalamin (VITAMIN B-12) 1000 MCG SUBL Place 1 tablet under the tongue daily.   Taking  . DULoxetine (CYMBALTA) 30 MG capsule TK 1 C PO QAM  5 Taking  . fluticasone (FLONASE) 50 MCG/ACT nasal spray Place 1 spray into both nostrils 2 (two) times daily.   Taking  . HYDROcodone-acetaminophen (NORCO) 10-325 MG tablet Take 1 tablet by mouth every 6 (six) hours as needed. (Patient taking differently: Take 1 tablet by mouth every 6 (six) hours as needed for moderate pain. ) 20 tablet 0 Taking  . hydrocortisone-pramoxine (PROCTOFOAM-HC) rectal foam Place 1 applicator rectally 2 (two) times daily. 10 g 0 Taking  . insulin glargine (LANTUS) 100 UNIT/ML injection Inject 0.25 mLs (25 Units total) into the skin at bedtime. 10 mL 11 Taking  . levothyroxine (SYNTHROID, LEVOTHROID) 150 MCG tablet Take 150 mcg by mouth daily.     Taking  . metoprolol (LOPRESSOR) 50 MG tablet Take 50 mg by mouth 2 (two) times daily.   Taking  . polyethylene glycol (MIRALAX) packet Take 17 g by mouth daily. 30 each 1 Taking  . ranitidine (ZANTAC) 300 MG tablet Take 300 mg by mouth daily as needed for heartburn.  Taking  . Sucroferric Oxyhydroxide (VELPHORO PO) Take 1 tablet by mouth. With each meal   Taking  . vitamin C (ASCORBIC ACID) 500 MG tablet Take 500 mg by mouth daily.   Taking    Current medications: No current facility-administered medications for this encounter.     Amlodipine 10 mg daily Aspirin 325 mg daily Famotidine 20 mg daily Furosemide 40 mg twice a day Heparin 5000 units subcutaneous Hydralazine 100 mg every 8 hours Synthroid 150 mcg daily Metoprolol 25 mg twice a day MiraLax 17 g daily Nephro-Vite 1 tablet daily 0.8 g 3 times a day Senna tablets venlafaxine 75 mg twice a day Vitamin B12 Vitamin C Vitamin D3   Allergies: No Known Allergies    Past Medical History: Past Medical History:  Diagnosis Date  . Acute pulmonary edema  ([place]) 08/03/2015  . Anemia   . Anorectal ulcer 10/23/2015   and thrombosed internal hemorrhoids. with hematochezia  . Arthritis   . Back pain    myofascial, excacerberated in 6-7/09, 9/09- required narcotics at those times  . Cataract    Left eye( Dr. Katy Fitch, patient uninsured so she can't  get  an intervention)  . Depression    better on nortriptyline  . Diabetes mellitus   . Elbow fracture, right   . ESRD (end stage renal disease) ([place]) 09/2015   started HD in  09/2015. nephrolithiasis, s/p removal 12/05/07  . GERD (gastroesophageal reflux disease)   . Hyperplastic colon polyp 2001   f/u colonoscopy 11/23/06 was normal( Dr. Carlean Purl), falls into normal risk screening( next colonoscopy due in 2018)  . Hypertension   . Hypothyroidism   . Nephrolithiasis 11/2007, 01/2012  . Obesity   . Pap smear for cervical cancer screening    12/14/2005: normal, evidence of candida. 05/16/2007: normal, benign reperative changes.  . Pneumonia    as a child  . Sleep apnea   . Tongue cancer ([place]) 06/08/15 bx   SCC of left lateral tongue      Past Surgical History: Past Surgical History:  Procedure Laterality Date  . AV FISTULA PLACEMENT Left 05/12/2015   Procedure: ARTERIOVENOUS (AV) FISTULA CREATION;  Surgeon: Serafina Mitchell, MD;  Location: [facility];  Service: Vascular;  Laterality: Left;  . AV FISTULA PLACEMENT  05/12/15   left fore arm  . CATARACT EXTRACTION Left   . DILATION AND CURETTAGE OF UTERUS    . FLEXIBLE SIGMOIDOSCOPY N/A 10/23/2015   Procedure: FLEXIBLE SIGMOIDOSCOPY;  Surgeon: Gatha Mayer, MD;  [facility] ENDOSCOPY;  Service: Endoscopy;  Laterality: N/A;  . glossectomy Left 10/06/15   partial gossectomy and neck dissection at [facility].  1/18 neck nodes positive. SCC of tongue  . LITHOTRIPSY  11/2007     Family History: Family History  Problem Relation Age of Onset  . Diabetes Mother   . Heart disease Mother     before age 55  . Hypertension Mother      Social  History: Social History   Social History  . Marital status: Married    Spouse name: N/A  . Number of children: 2  . Years of education: N/A   Occupational History  . retired    Social History Main Topics  . Smoking status: Never Smoker  . Smokeless tobacco: Never Used  . Alcohol use No  . Drug use: No  . Sexual activity: Not on file   Other Topics Concern  . Not on file   Social History Narrative   Married(  2nd marriage). Husband awaiting back surgery. BE AWARE THAT HUSBAND BROKE PAIN CONTRACT WITH [facility] SEVERAL TIMES!   Patient unemployed. Difficulty finding a jobb/c she is partially blind( but not blind enough to have disability). She used to work in [employer].. The grandson's mother basically   Son 20 y/o , unemployed and grandson lives with her. The grandson's mother basically dropped the child off when he was 65 y/o. They rarely hear from her They moved to an apartment from their house in 12/2008.     Review of Systems: limited because patient has some difficulty talking due to recent surgery Gen: no fevers or chills HEENT: lung cancer, recent surgery CV: no chest pain or shortness of breath Resp: no cough or sputum [id number] feedings via NG tube GU : no complaints MS: no complaints Derm:  no complaints Psych:no complaints Heme: no complaints Neuro: previously was able to walk with a walker or cane Endocrine./ diabetes  Vital Signs: There were no vitals taken for this visit.  No intake or output data in the 24 hours ending 07/13/16 0929  Weight trends: There were no vitals filed for this visit.  Physical Exam: General: n o acute distress, laying in the bed  HEENT Recent tongue surgery. Dobbhoff tube in place  Neck:  supple  Lungs: Normal breathing effort, clear  Heart:: regular, no rub or gallop  Abdomen: Soft, nontender  Extremities:  no peripheral edema  Neurologic: Alert, oriented  Skin: No acute rashes  Access: Left forearm AV  fistula          Lab results: Basic Metabolic Panel:  Recent Labs Lab 07/10/16 0645 07/11/16 1143 07/12/16 1052  NA 131* 135 135  K 3.7 3.5 3.6  CL 96* 99* 100*  CO2 24 24 25   GLUCOSE 81 114* 105*  BUN 29* 38* 43*  CREATININE 4.32* 4.99* 5.45*  CALCIUM 8.6* 8.8* 9.2  MG  --   --  2.9*  PHOS  --   --  6.5*    Liver Function Tests:  Recent Labs Lab 07/09/16 0624 07/12/16 1052  AST 26  --   ALT 16  --   ALKPHOS 105  --   BILITOT 0.5  --   PROT 6.9  --   ALBUMIN 2.4* 2.5*   No results for input(s): LIPASE, AMYLASE in the last 168 hours. No results for input(s): AMMONIA in the last 168 hours.  CBC:  Recent Labs Lab 07/09/16 0624 07/12/16 1052  WBC 12.8* 9.2  NEUTROABS  --  6.4  HGB 8.4* 8.0*  HCT 26.5* 24.8*  MCV 91.1 89.2  PLT 397 401*    Cardiac Enzymes: No results for input(s): CKTOTAL, TROPONINI in the last 168 hours.  BNP: Invalid input(s): POCBNP  CBG: No results for input(s): GLUCAP in the last 168 hours.  Microbiology: No results found for this or any previous visit (from the past 720 hour(s)).   Coagulation Studies: No results for input(s): LABPROT, INR in the last 72 hours.  Urinalysis: No results for input(s): COLORURINE, LABSPEC, PHURINE, GLUCOSEU, HGBUR, BILIRUBINUR, KETONESUR, PROTEINUR, UROBILINOGEN, NITRITE, LEUKOCYTESUR in the last 72 hours.  Invalid input(s): APPERANCEUR      Imaging:  No results found.   Assessment & Plan: Pt is a 65 y.o. caucasian female with medical problems of type 2 diabetes, Coronary artery disease, chronic kidney disease followed by Dr. Graylon Gunning of [place] [facility], obstructive sleep apnea, obesity, tongue cancer, status post partial glossectomy in February is  transferred  from [facility].   1. End stage renal disease 2. Anemia of chronic kidney disease- Avoid ESA due to recent malignancy 3. SHPTH. pth 326  (07/2015)  Plan: We will place patient on a MWF schedule She has a left  forearm AVF No UF as patient appears euvolemic Low phos tube feeds No ESA due to recent cancer. Will need blood transfusion as necessary

## 2016-07-14 ENCOUNTER — Other Ambulatory Visit (HOSPITAL_COMMUNITY): Payer: Medicare Other

## 2016-07-14 ENCOUNTER — Encounter: Payer: Self-pay | Admitting: Radiology

## 2016-07-14 HISTORY — PX: IR GENERIC HISTORICAL: IMG1180011

## 2016-07-14 LAB — PTH, INTACT AND CALCIUM
CALCIUM TOTAL (PTH): 9 mg/dL (ref 8.7–10.3)
PTH: 109 pg/mL — ABNORMAL HIGH (ref 15–65)

## 2016-07-14 LAB — CBC WITH DIFFERENTIAL/PLATELET
BASOS ABS: 0 10*3/uL (ref 0.0–0.1)
BASOS PCT: 0 %
EOS ABS: 0.3 10*3/uL (ref 0.0–0.7)
Eosinophils Relative: 3 %
HEMATOCRIT: 24.9 % — AB (ref 36.0–46.0)
Hemoglobin: 8.1 g/dL — ABNORMAL LOW (ref 12.0–15.0)
Lymphocytes Relative: 17 %
Lymphs Abs: 1.6 10*3/uL (ref 0.7–4.0)
MCH: 29.1 pg (ref 26.0–34.0)
MCHC: 32.5 g/dL (ref 30.0–36.0)
MCV: 89.6 fL (ref 78.0–100.0)
MONO ABS: 0.8 10*3/uL (ref 0.1–1.0)
Monocytes Relative: 8 %
NEUTROS ABS: 6.7 10*3/uL (ref 1.7–7.7)
Neutrophils Relative %: 72 %
PLATELETS: 427 10*3/uL — AB (ref 150–400)
RBC: 2.78 MIL/uL — ABNORMAL LOW (ref 3.87–5.11)
RDW: 16.2 % — AB (ref 11.5–15.5)
WBC: 9.4 10*3/uL (ref 4.0–10.5)

## 2016-07-14 LAB — PROTIME-INR
INR: 1.01
PROTHROMBIN TIME: 13.3 s (ref 11.4–15.2)

## 2016-07-14 LAB — BASIC METABOLIC PANEL
ANION GAP: 10 (ref 5–15)
BUN: 25 mg/dL — ABNORMAL HIGH (ref 6–20)
CALCIUM: 8.9 mg/dL (ref 8.9–10.3)
CO2: 28 mmol/L (ref 22–32)
Chloride: 95 mmol/L — ABNORMAL LOW (ref 101–111)
Creatinine, Ser: 3.61 mg/dL — ABNORMAL HIGH (ref 0.44–1.00)
GFR, EST AFRICAN AMERICAN: 14 mL/min — AB (ref >60)
GFR, EST NON AFRICAN AMERICAN: 12 mL/min — AB (ref >60)
GLUCOSE: 91 mg/dL (ref 65–99)
Potassium: 3.6 mmol/L (ref 3.5–5.1)
Sodium: 133 mmol/L — ABNORMAL LOW (ref 135–145)

## 2016-07-14 LAB — APTT: APTT: 39 s — AB (ref 24–36)

## 2016-07-14 LAB — HEPATITIS B SURFACE ANTIGEN: HEP B S AG: NEGATIVE

## 2016-07-14 LAB — HEPATITIS B CORE ANTIBODY, TOTAL: Hep B Core Total Ab: NEGATIVE

## 2016-07-14 LAB — HEPATITIS B SURFACE ANTIBODY,QUALITATIVE: Hep B S Ab: NONREACTIVE

## 2016-07-14 MED ORDER — LIDOCAINE HCL 1 % IJ SOLN
INTRAMUSCULAR | Status: AC | PRN
  Administered 2016-07-14: 10 mL

## 2016-07-14 MED ORDER — FENTANYL CITRATE (PF) 100 MCG/2ML IJ SOLN
INTRAMUSCULAR | Status: AC
  Filled 2016-07-14: qty 4

## 2016-07-14 MED ORDER — LIDOCAINE HCL 1 % IJ SOLN
INTRAMUSCULAR | Status: AC
  Filled 2016-07-14: qty 20

## 2016-07-14 MED ORDER — IOPAMIDOL (ISOVUE-300) INJECTION 61%
INTRAVENOUS | Status: AC
  Administered 2016-07-14: 20 mL
  Filled 2016-07-14: qty 50

## 2016-07-14 MED ORDER — CEFAZOLIN SODIUM-DEXTROSE 2-4 GM/100ML-% IV SOLN
2.0000 g | Freq: Once | INTRAVENOUS | Status: AC
  Administered 2016-07-14: 2 g via INTRAVENOUS

## 2016-07-14 MED ORDER — GLUCAGON HCL RDNA (DIAGNOSTIC) 1 MG IJ SOLR
INTRAMUSCULAR | Status: AC
  Filled 2016-07-14: qty 1

## 2016-07-14 MED ORDER — MIDAZOLAM HCL 2 MG/2ML IJ SOLN
INTRAMUSCULAR | Status: AC | PRN
  Administered 2016-07-14 (×2): 1 mg via INTRAVENOUS

## 2016-07-14 MED ORDER — CEFAZOLIN SODIUM-DEXTROSE 2-4 GM/100ML-% IV SOLN
INTRAVENOUS | Status: AC
  Administered 2016-07-14: 2 g via INTRAVENOUS
  Filled 2016-07-14: qty 100

## 2016-07-14 MED ORDER — FENTANYL CITRATE (PF) 100 MCG/2ML IJ SOLN
INTRAMUSCULAR | Status: AC | PRN
  Administered 2016-07-14 (×2): 50 ug via INTRAVENOUS

## 2016-07-14 MED ORDER — MIDAZOLAM HCL 2 MG/2ML IJ SOLN
INTRAMUSCULAR | Status: AC
  Filled 2016-07-14: qty 4

## 2016-07-14 NOTE — Procedures (Signed)
Interventional Radiology Procedure Note  Procedure: Placement of percutaneous 20F pull-through gastrostomy tube. Complications: None Recommendations: - NPO except for sips and chips remainder of today and overnight - Maintain G-tube to LWS until tomorrow morning  - May advance diet as tolerated and begin using tube tomorrow morning  Signed,   Stevey Stapleton S. Alajia Schmelzer, DO   

## 2016-07-14 NOTE — Sedation Documentation (Signed)
Patient is resting comfortably. 

## 2016-07-14 NOTE — H&P (Signed)
Chief Complaint: Patient was seen in consultation today for placement of gastrostomy tube at the request of Robb Matar PA-C  Referring Physician(s): Robb Matar PA-C  Supervising Physician: Corrie Mckusick  Patient Status: [facility] - In-pt  History of Present Illness: Tami Elliott is a 65 y.o. female who underwent partial glossectomy with free flap reconstruction and neck dissection with tracheostomy at [facility]. She's had significant dysphagia and has been receiving TF via NG Dobhoff. She has since been decannulated and is on room air but still having dysphagia issues. She will need longer term enteral TF during her rehab process. IR is asked to eval for perc G-tube PMHx, meds, labs, imaging reviewed. Sub-q heparin has been held.   Past Medical History:  Diagnosis Date  . Acute pulmonary edema ([place]) 08/03/2015  . Anemia   . Anorectal ulcer 10/23/2015   and thrombosed internal hemorrhoids. with hematochezia  . Arthritis   . Back pain    myofascial, excacerberated in 6-7/09, 9/09- required narcotics at those times  . Cataract    Left eye( Dr. Katy Fitch, patient uninsured so she can't  get  an intervention)  . Depression    better on nortriptyline  . Diabetes mellitus   . Elbow fracture, right   . ESRD (end stage renal disease) ([place]) 09/2015   started HD in  09/2015. nephrolithiasis, s/p removal 12/05/07  . GERD (gastroesophageal reflux disease)   . Hyperplastic colon polyp 2001   f/u colonoscopy 11/23/06 was normal( Dr. Carlean Purl), falls into normal risk screening( next colonoscopy due in 2018)  . Hypertension   . Hypothyroidism   . Nephrolithiasis 11/2007, 01/2012  . Obesity   . Pap smear for cervical cancer screening    12/14/2005: normal, evidence of candida. 05/16/2007: normal, benign reperative changes.  . Pneumonia    as a child  . Sleep apnea   . 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 AV FISTULA  PLACEMENT Left 05/12/2015   Procedure: ARTERIOVENOUS (AV) FISTULA CREATION;  Surgeon: Serafina Mitchell, MD;  Location: [place];  Service: Vascular;  Laterality: Left;  . AV FISTULA PLACEMENT  05/12/15   left fore arm  . CATARACT EXTRACTION Left   . DILATION AND CURETTAGE OF UTERUS    . FLEXIBLE SIGMOIDOSCOPY N/A 10/23/2015   Procedure: FLEXIBLE SIGMOIDOSCOPY;  Surgeon: Gatha Mayer, MD;  Location: [facility];  Service: Endoscopy;  Laterality: N/A;  . glossectomy Left 10/06/15   partial gossectomy and neck dissection at [facility].  1/18 neck nodes positive. SCC of tongue  . LITHOTRIPSY  11/2007    Allergies: Patient has no known allergies.  Medications: Prior to Admission medications   Medication Sig Start Date End Date Taking? Authorizing Provider  ACCU-CHEK AVIVA PLUS test strip CHECK BLOOD SUGAR D 11/21/15   Historical Provider, MD  amLODipine (NORVASC) 10 MG tablet TK 1 T PO D 11/23/15   Historical Provider, MD  cetirizine (ZYRTEC) 10 MG tablet Take 10 mg by mouth daily as needed for allergies.  04/02/15   Historical Provider, MD  Cholecalciferol (VITAMIN D) 2000 units tablet Take by mouth.    Historical Provider, MD  Cyanocobalamin (VITAMIN B-12) 1000 MCG SUBL Place 1 tablet under the tongue daily.    Historical Provider, MD  DULoxetine (CYMBALTA) 30 MG capsule TK 1 C PO QAM 12/16/15   Historical Provider, MD  fluticasone (FLONASE) 50 MCG/ACT nasal spray  Place 1 spray into both nostrils 2 (two) times daily.    Historical Provider, MD  HYDROcodone-acetaminophen (NORCO) 10-325 MG tablet Take 1 tablet by mouth every 6 (six) hours as needed. Patient taking differently: Take 1 tablet by mouth every 6 (six) hours as needed for moderate pain.  05/12/15   Samantha J Rhyne, PA-C  hydrocortisone-pramoxine (PROCTOFOAM-HC) rectal foam Place 1 applicator rectally 2 (two) times daily. 10/30/15   Verlee Monte, MD  insulin glargine (LANTUS) 100 UNIT/ML injection Inject 0.25 mLs (25 Units total) into the  skin at bedtime. 08/08/15   Thurnell Lose, MD  levothyroxine (SYNTHROID, LEVOTHROID) 150 MCG tablet Take 150 mcg by mouth daily.      Historical Provider, MD  metoprolol (LOPRESSOR) 50 MG tablet Take 50 mg by mouth 2 (two) times daily.    Historical Provider, MD  polyethylene glycol (MIRALAX) packet Take 17 g by mouth daily. 10/30/15   Verlee Monte, MD  ranitidine (ZANTAC) 300 MG tablet Take 300 mg by mouth daily as needed for heartburn.    Historical Provider, MD  Sucroferric Oxyhydroxide (VELPHORO PO) Take 1 tablet by mouth. With each meal    Historical Provider, MD  vitamin C (ASCORBIC ACID) 500 MG tablet Take 500 mg by mouth daily.    Historical Provider, MD     Family History  Problem Relation Age of Onset  . Diabetes Mother   . Heart disease Mother     before age 69  . Hypertension Mother     Social History   Social History  . Marital status: Married    Spouse name: N/A  . Number of children: 2  . Years of education: N/A   Occupational History  . retired    Social History Main Topics  . Smoking status: Never Smoker  . Smokeless tobacco: Never Used  . Alcohol use No  . Drug use: No  . Sexual activity: Not Asked   Other Topics Concern  . None   Social History Narrative   Married( 2nd marriage). Husband awaiting back surgery. BE AWARE THAT HUSBAND BROKE PAIN CONTRACT WITH [facility] SEVERAL TIMES!   Patient unemployed. Difficulty finding a jobb/c she is partially blind( but not blind enough to have disability). She used to work in [employer].. The grandson's mother basically   Son 29 y/o , unemployed and grandson lives with her. The grandson's mother basically dropped the child off when he was 4 y/o. They rarely hear from her They moved to an apartment from their house in 12/2008.     Review of Systems: A 12 point ROS discussed and pertinent positives are indicated in the HPI above.  All other systems are negative.  Review of Systems  Vital Signs: .  Afebrile  BP 170/60  Physical Exam  Constitutional: She is oriented to person, place, and time. She appears well-developed and well-nourished. No distress.  HENT:  Head: Normocephalic.  Post surgical changes of glossectomy/reconstruction but pharynx is visible.  Neck: Normal range of motion. No tracheal deviation present.  Cardiovascular: Normal rate, regular rhythm and normal heart sounds.   Pulmonary/Chest: Effort normal and breath sounds normal.  Abdominal: Soft. She exhibits no distension. There is no tenderness.  Neurological: She is alert and oriented to person, place, and time.  Skin: Skin is warm and dry.  Psychiatric: She has a normal mood and affect. Judgment normal.     Mallampati Score:  MD Evaluation Airway: WNL Heart: WNL Abdomen: WNL Chest/ Lungs: WNL ASA  Classification: 3 Mallampati/Airway Score: Two  Imaging: Ct Abdomen Wo Contrast  Result Date: 07/14/2016 CLINICAL DATA:  Dysphagia. Evaluate for potential gastrostomy tube placement. History of tongue cancer. EXAM: CT ABDOMEN WITHOUT CONTRAST TECHNIQUE: Multidetector CT imaging of the abdomen was performed following the standard protocol without IV contrast. COMPARISON:  PET-CT 06/30/2015 FINDINGS: Lower chest: Small amount of right pleural fluid. Small amount of compressive atelectasis in the right lower lobe. 3 mm nodule at the left lung base on sequence 3, image 7. This area was not visualized on prior PET due to pleural fluid. Minimal atelectasis at the left lung base. Hepatobiliary: Normal appearance the liver. Mild distention of the gallbladder. Pancreas: Normal appearance of the pancreas without inflammation or duct dilatation. Spleen: Normal appearance of spleen without enlargement. Adrenals/Urinary Tract: Normal adrenal glands. Chronic perinephric edema. No hydronephrosis. Stomach/Bowel: There is a nasogastric feeding tube in the stomach body. Distal stomach body is adjacent to the anterior abdominal wall. The  colon is inferior to the stomach. This anatomy is amendable to percutaneous gastrostomy tube placement. Normal appearance of the duodenum and small bowel. Visualized appendix is normal. There is no significant bowel distension. Vascular/Lymphatic: Atherosclerotic calcifications in the aorta without aneurysm. No significant lymph node enlargement in the abdomen. Other: Scattered densities throughout the anterior subcutaneous tissues may be related to injection sites and represent tiny hematomas. No ascites. Musculoskeletal: Probable vertebral body hemangiomas at T11 and L1. IMPRESSION: Patient's anatomy would be amendable to percutaneous gastrostomy tube placement. Small right pleural effusion with basilar atelectasis. 3 mm nodular density at the left lung base is indeterminate. No follow-up needed if patient is low-risk. Non-contrast chest CT can be considered in 12 months if patient is high-risk. This recommendation follows the consensus statement: Guidelines for Management of Incidental Pulmonary Nodules Detected on CT Images: From the Fleischner Society 2017; Radiology 2017; 284:228-243. Electronically Signed   By: Markus Daft M.D.   On: 07/14/2016 07:44   Ct Soft Tissue Neck W Contrast  Result Date: 07/09/2016 CLINICAL DATA:  65 year old female with head and neck cancer status post treatment including partial glossectomy, node dissection. Unable to aspirate tracheostomy tube. Initial encounter. EXAM: CT NECK WITH CONTRAST TECHNIQUE: Multidetector CT imaging of the neck was performed using the standard protocol following the bolus administration of intravenous contrast. CONTRAST:  53mL ISOVUE-300 IOPAMIDOL (ISOVUE-300) INJECTION 61% in conjunction with contrast enhanced imaging of the chest reported separately. COMPARISON:  Chest CT from today reported separately. Prior neck CT 11/08/2015. FINDINGS: Pharynx and larynx: There is a right nasoenteric tube which courses into the hypopharynx and cervical  esophagus, and continues into the thoracic esophagus. Interval additional postoperative changes to the left tongue base, left sublingual space, and left submandibular space. New flap soft tissue reconstruction in the spaces. Mildly increased generalized pharyngeal mucosal space soft tissue thickening at the hypopharynx and larynx. The glottis today is closed. No discrete pharyngeal or laryngeal mass. The superior parapharyngeal spaces remain normal. Negative retropharyngeal space. Salivary glands: Surgically absent left submandibular gland. The right submandibular gland was present in March but now appears to be surgically absent. Both parotid glands remain and appear within normal limits. Thyroid: Abnormal right thyroid lobe affected by the tracheostomy tube which is malpositioned. The superficial aspect of the tube is within the tracheal stoma, but the tube tracks lateral to the trachea at terminating in the anterior superior mediastinum between the inferior right thyroid lobe an the great vessels. Small volume of gas and soft tissue stranding in the superior mediastinum  with reactive appearing lymph nodes. No mediastinal hematoma identified. See also chest CT findings today reported separately. Lymph nodes: Extensive postoperative surgical clips throughout the bilateral neck soft tissues. Soft tissue flap reconstruction from the central floor of mouth through the left submandibular space. The flap reconstruction also appears to cross midline into the right submandibular space. Granulation tissue in these regions. There is malignant lymphadenopathy at the left level 2 nodal station where an enlarged, rounded, and internally heterogeneous node abutting surgical clips measures 14 x 17 mm (series 1, image 44). Other bilateral cervical lymph nodes situated posterior to the carotid spaces are within normal limits and have mildly decreased since March, with the largest seen at the left level 2 B station measuring 6 mm  short axis (previously 8 mm). Small but asymmetrically increased in number left supraclavicular lymph nodes (e.g. Series 1, image 73) do not appear pathologic. Vascular: Major vascular structures in the neck including the bilateral carotids and internal jugular veins are patent to the skullbase. Major vascular structures at the skullbase are patent. Calcified atherosclerosis at the skull base. Limited intracranial: Negative. Visualized orbits: Stable and negative. Mastoids and visualized paranasal sinuses: Mild left anterior ethmoid sinus mucosal thickening is new. Other visualized paranasal sinuses and mastoids are stable and well pneumatized. Right nasoenteric tube is in place. Skeleton: Negative. No acute or suspicious osseous lesion in the neck. Upper chest: Reported separately today. IMPRESSION: 1. Malpositioned tracheostomy tube which tracks from the skin surface into the anterior superior mediastinum situated between the right thyroid lobe and great vessels. Small volume of gas and soft tissue stranding in the superior mediastinum with reactive appearing lymph nodes. No mediastinal hematoma identified. This was discussed by telephone with Dr. Gillian Scarce on 07/09/2016 at 15:53. I advised him to also follow up with the Chest CT findings, reported separately. 2. Nasoenteric tube appears appropriately positioned, coursing into the esophagus. 3. Postoperative changes since March to the subglottic space and both submandibular spaces with a soft tissue flap reconstruction. 4. Malignant left level 2 lymph node measuring 17 mm long axis. No definite additional tumor or lymphadenopathy but this study will serve as a new postoperative baseline. Electronically Signed   By: Genevie Ann M.D.   On: 07/09/2016 15:59   Ct Chest W Contrast  Result Date: 07/09/2016 CLINICAL DATA:  Tracheal obstruction. EXAM: CT CHEST WITH CONTRAST TECHNIQUE: Multidetector CT imaging of the chest was performed during intravenous contrast  administration. CONTRAST:  4mL ISOVUE-300 IOPAMIDOL (ISOVUE-300) INJECTION 61% COMPARISON:  Radiographs of same day. FINDINGS: Cardiovascular: No evidence of thoracic aortic aneurysm or dissection. Coronary artery calcifications are noted. Mediastinum/Nodes: No mediastinal mass or adenopathy is noted. The patient's tracheostomy tube tip it outside of the tracheal lumen in the soft tissues anterior to the trachea. Lungs/Pleura: No pneumothorax or significant pleural effusion is noted. Mild left posterior basilar atelectasis or infiltrate is noted. Upper Abdomen: Feeding tube tip is seen in distal stomach. Musculoskeletal: No chest wall abnormality. No acute or significant osseous findings. IMPRESSION: Coronary artery calcifications are noted suggesting coronary artery disease. Mild left posterior basilar subsegmental atelectasis or infiltrate is noted. The tip of the patient's tracheostomy tube is located outside of the tracheal lumen in the soft tissues anterior and to the right of the trachea. Critical Value/emergent results were called by telephone at the time of interpretation on 07/09/2016 at 4:10 pm to Narda Bonds, the patient's nurse, who verbally acknowledged these results and stated that they were already aware of these findings and  ENT has been consulted. Electronically Signed   By: Marijo Conception, M.D.   On: 07/09/2016 16:12   Dg Chest Port 1 View  Result Date: 07/09/2016 CLINICAL DATA:  65 year old female with respiratory failure EXAM: PORTABLE CHEST 1 VIEW COMPARISON:  Prior chest x-ray 11/08/2015 FINDINGS: A tracheostomy tube is present. The tip is midline and at the level of the clavicles. Extensive surgical clips are present in the soft tissues of the left greater than right neck. A right upper extremity approach PICC. Catheter tip terminates at the cavoatrial junction. A feeding tube is present. The tip of the tube can be just visualized overlying the stomach. Stable cardiomegaly. Tortuous  and atherosclerotic thoracic aorta. Inspiratory volumes are low in there is likely mild bibasilar atelectasis. No oval pulmonary edema, large effusion or pneumothorax. IMPRESSION: 1. Tracheostomy tube is well positioned with the tip midline and at the level of the clavicles. 2. Right upper extremity PICC with the catheter tip overlying the cavoatrial junction. 3. Enteric feeding tube with the tip overlying the stomach. 4. Low inspiratory volumes with mild bibasilar atelectasis. Otherwise, no acute cardiopulmonary process. Electronically Signed   By: Jacqulynn Cadet M.D.   On: 07/09/2016 08:54   Dg Abd Portable 1v  Result Date: 07/08/2016 CLINICAL DATA:  Enteric tube replacement.  Initial encounter. EXAM: PORTABLE ABDOMEN - 1 VIEW COMPARISON:  None. FINDINGS: The patient's enteric tube is noted ending overlying the body of the stomach. The visualized bowel gas pattern is unremarkable. Scattered air and stool filled loops of colon are seen; no abnormal dilatation of small bowel loops is seen to suggest small bowel obstruction. No free intra-abdominal air is identified, though evaluation for free air is limited on a single supine view. The visualized osseous structures are within normal limits; the sacroiliac joints are unremarkable in appearance. The visualized lung bases are essentially clear. IMPRESSION: Enteric tube noted ending overlying the body of the stomach. Electronically Signed   By: Garald Balding M.D.   On: 07/08/2016 23:18    Labs:  CBC:  Recent Labs  01/25/16 0814 07/09/16 0624 07/12/16 1052 07/14/16 0500  WBC 14.9* 12.8* 9.2 9.4  HGB 10.0* 8.4* 8.0* 8.1*  HCT 29.5* 26.5* 24.8* 24.9*  PLT 430.0* 397 401* 427*    COAGS:  Recent Labs  08/03/15 2201 10/21/15 1930 07/14/16 0500  INR 1.15 1.34 1.01  APTT  --   --  39*    BMP:  Recent Labs  07/11/16 1143 07/12/16 1052 07/13/16 0500 07/14/16 0500  NA 135 135 135 133*  K 3.5 3.6 3.7 3.6  CL 99* 100* 97* 95*  CO2 24  25 25 28   GLUCOSE 114* 105* 81 91  BUN 38* 43* 48* 25*  CALCIUM 8.8* 9.2 9.3 8.9  CREATININE 4.99* 5.45* 5.89* 3.61*  GFRNONAA 8* 8* 7* 12*  GFRAA 10* 9* 8* 14*    LIVER FUNCTION TESTS:  Recent Labs  10/21/15 1930 10/22/15 0243  10/29/15 0626 11/07/15 2335 07/09/16 0624 07/12/16 1052  BILITOT 0.7 0.4  --   --  0.8 0.5  --   AST 18 12*  --   --  22 26  --   ALT 8* 7*  --   --  13* 16  --   ALKPHOS 85 76  --   --  106 105  --   PROT 7.1 6.2*  --   --  6.9 6.9  --   ALBUMIN 2.5* 2.3*  < > 2.3* 2.4* 2.4*  2.5*  < > = values in this interval not displayed.  TUMOR MARKERS: No results for input(s): AFPTM, CEA, CA199, CHROMGRNA in the last 8760 hours.  Assessment and Plan: Dysphagia For Perc G-tube Labs and imaging reviewed with Tami Elliott, ok's Explained role of G-tube and that it should be temporary. Risks and Benefits discussed with the patient including, but not limited to the need for a barium enema during the procedure, bleeding, infection, peritonitis, or damage to adjacent structures. All of the patient's questions were answered, patient is agreeable to proceed. Consent signed and in chart.    Thank you for this interesting consult.  I greatly enjoyed meeting Tami Elliott and look forward to participating in their care.  A copy of this report was sent to the requesting provider on this date.  Electronically Signed: Ascencion Dike 07/14/2016, 9:45 AM   I spent a total of 20 minutes in face to face in clinical consultation, greater than 50% of which was counseling/coordinating care for Gastrostomy tube palcement

## 2016-07-15 LAB — BASIC METABOLIC PANEL
Anion gap: 13 (ref 5–15)
BUN: 30 mg/dL — AB (ref 6–20)
CHLORIDE: 96 mmol/L — AB (ref 101–111)
CO2: 26 mmol/L (ref 22–32)
Calcium: 9.2 mg/dL (ref 8.9–10.3)
Creatinine, Ser: 4.42 mg/dL — ABNORMAL HIGH (ref 0.44–1.00)
GFR calc Af Amer: 11 mL/min — ABNORMAL LOW (ref >60)
GFR calc non Af Amer: 10 mL/min — ABNORMAL LOW (ref >60)
GLUCOSE: 96 mg/dL (ref 65–99)
POTASSIUM: 3.7 mmol/L (ref 3.5–5.1)
Sodium: 135 mmol/L (ref 135–145)

## 2016-07-15 LAB — PHOSPHORUS: Phosphorus: 4.8 mg/dL — ABNORMAL HIGH (ref 2.5–4.6)

## 2016-07-15 LAB — HEMOGLOBIN AND HEMATOCRIT, BLOOD
HCT: 27.1 % — ABNORMAL LOW (ref 36.0–46.0)
Hemoglobin: 8.8 g/dL — ABNORMAL LOW (ref 12.0–15.0)

## 2016-07-15 NOTE — Progress Notes (Signed)
Subjective:   PEG placed No SOB Sitting up in chair No acute c/o  Objective:  Vital signs in last 24 hours:  Pulse Rate:  [72-84] 77 (11/30 1027) Resp:  [15-20] 18 (11/30 1027) BP: (152-168)/(65-80) 168/72 (11/30 1027) SpO2:  [98 %-100 %] 98 % (11/30 1027)  Weight change:  There were no vitals filed for this visit.  Intake/Output:   No intake or output data in the 24 hours ending 07/15/16 0937   Physical Exam: General: NAD,   HEENT NG in place, anicteric  Neck supple  Pulm/lungs Normal effort, clear to auscultation  CVS/Heart Regular, no rub or gallop  Abdomen:  Soft, PEG in place  Extremities: No edema  Neurologic: Alert, able to follow commands  Skin: No acute rashes  Access: Left forearm AVF       Basic Metabolic Panel:   Recent Labs Lab 07/11/16 1143 07/12/16 1052 07/13/16 0500 07/13/16 1030 07/14/16 0500 07/15/16 0550  NA 135 135 135  --  133* 135  K 3.5 3.6 3.7  --  3.6 3.7  CL 99* 100* 97*  --  95* 96*  CO2 24 25 25   --  28 26  GLUCOSE 114* 105* 81  --  91 96  BUN 38* 43* 48*  --  25* 30*  CREATININE 4.99* 5.45* 5.89*  --  3.61* 4.42*  CALCIUM 8.8* 9.2 9.3 9.0 8.9 9.2  MG  --  2.9*  --   --   --   --   PHOS  --  6.5*  --   --   --  4.8*     CBC:  Recent Labs Lab 07/09/16 0624 07/12/16 1052 07/14/16 0500 07/15/16 0550  WBC 12.8* 9.2 9.4  --   NEUTROABS  --  6.4 6.7  --   HGB 8.4* 8.0* 8.1* 8.8*  HCT 26.5* 24.8* 24.9* 27.1*  MCV 91.1 89.2 89.6  --   PLT 397 401* 427*  --       Microbiology:  No results found for this or any previous visit (from the past 720 hour(s)).  Coagulation Studies:  Recent Labs  07/14/16 0500  LABPROT 13.3  INR 1.01    Urinalysis: No results for input(s): COLORURINE, LABSPEC, PHURINE, GLUCOSEU, HGBUR, BILIRUBINUR, KETONESUR, PROTEINUR, UROBILINOGEN, NITRITE, LEUKOCYTESUR in the last 72 hours.  Invalid input(s): APPERANCEUR    Imaging: Ct Abdomen Wo Contrast  Result Date:  07/14/2016 CLINICAL DATA:  Dysphagia. Evaluate for potential gastrostomy tube placement. History of tongue cancer. EXAM: CT ABDOMEN WITHOUT CONTRAST TECHNIQUE: Multidetector CT imaging of the abdomen was performed following the standard protocol without IV contrast. COMPARISON:  PET-CT 06/30/2015 FINDINGS: Lower chest: Small amount of right pleural fluid. Small amount of compressive atelectasis in the right lower lobe. 3 mm nodule at the left lung base on sequence 3, image 7. This area was not visualized on prior PET due to pleural fluid. Minimal atelectasis at the left lung base. Hepatobiliary: Normal appearance the liver. Mild distention of the gallbladder. Pancreas: Normal appearance of the pancreas without inflammation or duct dilatation. Spleen: Normal appearance of spleen without enlargement. Adrenals/Urinary Tract: Normal adrenal glands. Chronic perinephric edema. No hydronephrosis. Stomach/Bowel: There is a nasogastric feeding tube in the stomach body. Distal stomach body is adjacent to the anterior abdominal wall. The colon is inferior to the stomach. This anatomy is amendable to percutaneous gastrostomy tube placement. Normal appearance of the duodenum and small bowel. Visualized appendix is normal. There is no significant bowel distension. Vascular/Lymphatic: Atherosclerotic  calcifications in the aorta without aneurysm. No significant lymph node enlargement in the abdomen. Other: Scattered densities throughout the anterior subcutaneous tissues may be related to injection sites and represent tiny hematomas. No ascites. Musculoskeletal: Probable vertebral body hemangiomas at T11 and L1. IMPRESSION: Patient's anatomy would be amendable to percutaneous gastrostomy tube placement. Small right pleural effusion with basilar atelectasis. 3 mm nodular density at the left lung base is indeterminate. No follow-up needed if patient is low-risk. Non-contrast chest CT can be considered in 12 months if patient is  high-risk. This recommendation follows the consensus statement: Guidelines for Management of Incidental Pulmonary Nodules Detected on CT Images: From the Fleischner Society 2017; Radiology 2017; 284:228-243. Electronically Signed   By: Markus Daft M.D.   On: 07/14/2016 07:44   Ir Gastrostomy Tube Mod Sed  Result Date: 07/14/2016 INDICATION: 65 year old female with a history of dysphagia, status post head neck surgery EXAM: PERC PLACEMENT GASTROSTOMY MEDICATIONS: 2.0 g Ancef; Antibiotics were administered within 1 hour of the procedure. ANESTHESIA/SEDATION: Versed 2.0 mg IV; Fentanyl 1 Hunt mcg IV Moderate Sedation Time:  15 The patient was continuously monitored during the procedure by the interventional radiology nurse under my direct supervision. CONTRAST:  20 cc - administered into the gastric lumen. FLUOROSCOPY TIME:  Fluoroscopy Time: 3 minutes 24 seconds (34 mGy). COMPLICATIONS: None PROCEDURE: Informed written consent was obtained from the patient and the patient's family after a thorough discussion of the procedural risks, benefits and alternatives. All questions were addressed. Maximal Sterile Barrier Technique was utilized including caps, mask, sterile gowns, sterile gloves, sterile drape, hand hygiene and skin antiseptic. A timeout was performed prior to the initiation of the procedure. The epigastrium was prepped with Betadine in a sterile fashion, and a sterile drape was applied covering the operative field. A sterile gown and sterile gloves were used for the procedure. A 5-French orogastric tube is placed under fluoroscopic guidance. Scout imaging of the abdomen confirms barium within the transverse colon. The stomach was distended with gas. Under fluoroscopic guidance, an 18 gauge needle was utilized to puncture the anterior wall of the body of the stomach. An Amplatz wire was advanced through the needle passing a T fastener into the lumen of the stomach. The T fastener was secured for gastropexy.  A 9-French sheath was inserted. A snare was advanced through the 9-French sheath. A Britta Mccreedy was advanced through the orogastric tube. It was snared then pulled out the oral cavity, pulling the snare, as well. The leading edge of the gastrostomy was attached to the snare. It was then pulled down the esophagus and out the percutaneous site. Tube secured in place. Contrast was injected. Patient tolerated the procedure well and remained hemodynamically stable throughout. No complications were encountered and no significant blood loss encountered. IMPRESSION: Status post fluoroscopic placed percutaneous gastrostomy tube, with 20 [place] pull-through. Signed, Dulcy Fanny. Earleen Newport, DO [facility] Electronically Signed   By: Corrie Mckusick D.O.   On: 07/14/2016 10:38     Medications:       Assessment/ Plan:  65 y.o. caucasian female with medical problems of type 2 diabetes, Coronary artery disease, chronic kidney disease followed by Dr. Graylon Gunning of [place] [facility], obstructive sleep apnea, obesity, tongue cancer, status post partial glossectomy in February is  transferred from [facility].   1. End stage renal disease 2. Anemia of chronic kidney disease- Avoid ESA due to recent malignancy 3. SHPTH. pth 326  (07/2015)  Plan: continue MWF  schedule She has a left forearm AVF No UF as patient appears euvolemic Low phos tube feeds No ESA due to recent cancer. Will need blood transfusion as necessary   LOS: 0 Skyy Nilan 12/1/20179:37 AM

## 2016-07-16 LAB — BASIC METABOLIC PANEL
Anion gap: 12 (ref 5–15)
BUN: 21 mg/dL — AB (ref 6–20)
CALCIUM: 9.5 mg/dL (ref 8.9–10.3)
CHLORIDE: 96 mmol/L — AB (ref 101–111)
CO2: 28 mmol/L (ref 22–32)
CREATININE: 3.3 mg/dL — AB (ref 0.44–1.00)
GFR, EST AFRICAN AMERICAN: 16 mL/min — AB (ref >60)
GFR, EST NON AFRICAN AMERICAN: 14 mL/min — AB (ref >60)
Glucose, Bld: 134 mg/dL — ABNORMAL HIGH (ref 65–99)
Potassium: 3.9 mmol/L (ref 3.5–5.1)
SODIUM: 136 mmol/L (ref 135–145)

## 2016-07-18 LAB — RENAL FUNCTION PANEL
ANION GAP: 13 (ref 5–15)
Albumin: 2.8 g/dL — ABNORMAL LOW (ref 3.5–5.0)
BUN: 16 mg/dL (ref 6–20)
CHLORIDE: 95 mmol/L — AB (ref 101–111)
CO2: 27 mmol/L (ref 22–32)
Calcium: 8.9 mg/dL (ref 8.9–10.3)
Creatinine, Ser: 1.99 mg/dL — ABNORMAL HIGH (ref 0.44–1.00)
GFR calc Af Amer: 30 mL/min — ABNORMAL LOW (ref >60)
GFR calc non Af Amer: 26 mL/min — ABNORMAL LOW (ref >60)
GLUCOSE: 106 mg/dL — AB (ref 65–99)
POTASSIUM: 3.3 mmol/L — AB (ref 3.5–5.1)
Phosphorus: 1.8 mg/dL — ABNORMAL LOW (ref 2.5–4.6)
SODIUM: 135 mmol/L (ref 135–145)

## 2016-07-18 LAB — CBC
HEMATOCRIT: 26.5 % — AB (ref 36.0–46.0)
HEMOGLOBIN: 8.6 g/dL — AB (ref 12.0–15.0)
MCH: 29.2 pg (ref 26.0–34.0)
MCHC: 32.5 g/dL (ref 30.0–36.0)
MCV: 89.8 fL (ref 78.0–100.0)
Platelets: 423 10*3/uL — ABNORMAL HIGH (ref 150–400)
RBC: 2.95 MIL/uL — ABNORMAL LOW (ref 3.87–5.11)
RDW: 16.2 % — ABNORMAL HIGH (ref 11.5–15.5)
WBC: 10.9 10*3/uL — ABNORMAL HIGH (ref 4.0–10.5)

## 2016-07-18 NOTE — Progress Notes (Signed)
  Subjective:   Patient seen during dialysis Tolerating well Sitting up in chair 17 G needles used BFR 150 DFR 500  Objective:  Vital signs in last 24 hours:     T 98.2  RR 18 p 81  bp 166/71  Weight change:  There were no vitals filed for this visit.  Intake/Output:   No intake or output data in the 24 hours ending 07/18/16 0958   Physical Exam: General: NAD,   HEENT NG in place, anicteric  Neck supple  Pulm/lungs Normal effort, clear to auscultation  CVS/Heart Regular, no rub or gallop  Abdomen:  Soft, PEG in place  Extremities: No edema  Neurologic: Alert, able to follow commands  Skin: No acute rashes  Access: Left forearm AVF       Basic Metabolic Panel:   Recent Labs Lab 07/12/16 1052 07/13/16 0500  07/14/16 0500 07/15/16 0550 07/16/16 0531  NA 135 135  --  133* 135 136  K 3.6 3.7  --  3.6 3.7 3.9  CL 100* 97*  --  95* 96* 96*  CO2 25 25  --  28 26 28   GLUCOSE 105* 81  --  91 96 134*  BUN 43* 48*  --  25* 30* 21*  CREATININE 5.45* 5.89*  --  3.61* 4.42* 3.30*  CALCIUM 9.2 9.3  < > 8.9 9.2 9.5  MG 2.9*  --   --   --   --   --   PHOS 6.5*  --   --   --  4.8*  --   < > = values in this interval not displayed.   CBC:  Recent Labs Lab 07/12/16 1052 07/14/16 0500 07/15/16 0550  WBC 9.2 9.4  --   NEUTROABS 6.4 6.7  --   HGB 8.0* 8.1* 8.8*  HCT 24.8* 24.9* 27.1*  MCV 89.2 89.6  --   PLT 401* 427*  --       Microbiology:  No results found for this or any previous visit (from the past 720 hour(s)).  Coagulation Studies: No results for input(s): LABPROT, INR in the last 72 hours.  Urinalysis: No results for input(s): COLORURINE, LABSPEC, PHURINE, GLUCOSEU, HGBUR, BILIRUBINUR, KETONESUR, PROTEINUR, UROBILINOGEN, NITRITE, LEUKOCYTESUR in the last 72 hours.  Invalid input(s): APPERANCEUR    Imaging: No results found.   Medications:       Assessment/ Plan:  65 y.o. caucasian female with medical problems of type 2 diabetes,  Coronary artery disease, chronic kidney disease followed by Dr. Graylon Gunning of [place] [facility], obstructive sleep apnea, obesity, tongue cancer, status post partial glossectomy in February is  transferred from [facility].   1. End stage renal disease 2. Anemia of chronic kidney disease- Avoid ESA due to recent malignancy 3. SHPTH. pth 326  (07/2015)  Plan: continue MWF schedule She has a left forearm AVF- discussed with nurse about advancing to 16 G needles No UF as patient appears euvolemic Low phos tube feeds No ESA due to recent cancer. Will need blood transfusion as necessary Decrease hydralazine, Add ARB   LOS: 0 Danay Mckellar 12/4/20179:58 AM

## 2016-07-20 LAB — CBC
HEMATOCRIT: 25.4 % — AB (ref 36.0–46.0)
Hemoglobin: 8.2 g/dL — ABNORMAL LOW (ref 12.0–15.0)
MCH: 29 pg (ref 26.0–34.0)
MCHC: 32.3 g/dL (ref 30.0–36.0)
MCV: 89.8 fL (ref 78.0–100.0)
Platelets: 415 10*3/uL — ABNORMAL HIGH (ref 150–400)
RBC: 2.83 MIL/uL — ABNORMAL LOW (ref 3.87–5.11)
RDW: 15.9 % — AB (ref 11.5–15.5)
WBC: 11.2 10*3/uL — ABNORMAL HIGH (ref 4.0–10.5)

## 2016-07-20 LAB — RENAL FUNCTION PANEL
Albumin: 2.9 g/dL — ABNORMAL LOW (ref 3.5–5.0)
Anion gap: 9 (ref 5–15)
BUN: 48 mg/dL — AB (ref 6–20)
CHLORIDE: 91 mmol/L — AB (ref 101–111)
CO2: 32 mmol/L (ref 22–32)
Calcium: 10.1 mg/dL (ref 8.9–10.3)
Creatinine, Ser: 4.26 mg/dL — ABNORMAL HIGH (ref 0.44–1.00)
GFR calc Af Amer: 12 mL/min — ABNORMAL LOW (ref >60)
GFR, EST NON AFRICAN AMERICAN: 10 mL/min — AB (ref >60)
GLUCOSE: 124 mg/dL — AB (ref 65–99)
POTASSIUM: 3.7 mmol/L (ref 3.5–5.1)
Phosphorus: 5 mg/dL — ABNORMAL HIGH (ref 2.5–4.6)
Sodium: 132 mmol/L — ABNORMAL LOW (ref 135–145)

## 2016-07-20 NOTE — Progress Notes (Signed)
  Subjective:   Patient had dialysis earlier today  Tolerated well   16 G needles used BFR 300 DFR 600 Patient tolerated it well. Net UF 0  Objective:  Vital signs in last 24 hours:     T 96.6  RR 12 p 73  bp 123/79  Weight change:  There were no vitals filed for this visit.  Intake/Output:   No intake or output data in the 24 hours ending 07/20/16 1635   Physical Exam: General: NAD,   HEENT anicteric  Neck supple  Pulm/lungs Normal effort, clear to auscultation  CVS/Heart Regular, no rub or gallop  Abdomen:  Soft, PEG in place  Extremities: No edema  Neurologic: Alert, able to follow commands  Skin: No acute rashes  Access: Left forearm AVF       Basic Metabolic Panel:   Recent Labs Lab 07/14/16 0500 07/15/16 0550 07/16/16 0531 07/18/16 1011 07/20/16 0624  NA 133* 135 136 135 132*  K 3.6 3.7 3.9 3.3* 3.7  CL 95* 96* 96* 95* 91*  CO2 28 26 28 27  32  GLUCOSE 91 96 134* 106* 124*  BUN 25* 30* 21* 16 48*  CREATININE 3.61* 4.42* 3.30* 1.99* 4.26*  CALCIUM 8.9 9.2 9.5 8.9 10.1  PHOS  --  4.8*  --  1.8* 5.0*     CBC:  Recent Labs Lab 07/14/16 0500 07/15/16 0550 07/18/16 1011 07/20/16 0624  WBC 9.4  --  10.9* 11.2*  NEUTROABS 6.7  --   --   --   HGB 8.1* 8.8* 8.6* 8.2*  HCT 24.9* 27.1* 26.5* 25.4*  MCV 89.6  --  89.8 89.8  PLT 427*  --  423* 415*      Microbiology:  No results found for this or any previous visit (from the past 720 hour(s)).  Coagulation Studies: No results for input(s): LABPROT, INR in the last 72 hours.  Urinalysis: No results for input(s): COLORURINE, LABSPEC, PHURINE, GLUCOSEU, HGBUR, BILIRUBINUR, KETONESUR, PROTEINUR, UROBILINOGEN, NITRITE, LEUKOCYTESUR in the last 72 hours.  Invalid input(s): APPERANCEUR    Imaging: No results found.   Medications:       Assessment/ Plan:  65 y.o. caucasian female with medical problems of type 2 diabetes, Coronary artery disease, chronic kidney disease followed by Dr.  Graylon Gunning of [place] [facility], obstructive sleep apnea, obesity, tongue cancer, status post partial glossectomy in February is  transferred from [facility].   1. End stage renal disease 2. Anemia of chronic kidney disease- Avoid ESA due to recent malignancy 3. SHPTH. pth 326  (07/2015)  Plan: continue MWF schedule She has a left forearm AVF- use 16 G needles No UF as patient appears euvolemic Low phos tube feeds, recent level 5 No ESA due to recent cancer. Will need blood transfusion as necessary D/c hydralazine, increase ARB   LOS: 0 Wilmina Maxham 12/6/20174:35 PM

## 2016-07-22 LAB — CBC WITH DIFFERENTIAL/PLATELET
Basophils Absolute: 0 10*3/uL (ref 0.0–0.1)
Basophils Relative: 0 %
Eosinophils Absolute: 0.5 10*3/uL (ref 0.0–0.7)
Eosinophils Relative: 5 %
HCT: 25 % — ABNORMAL LOW (ref 36.0–46.0)
Hemoglobin: 8.1 g/dL — ABNORMAL LOW (ref 12.0–15.0)
Lymphocytes Relative: 20 %
Lymphs Abs: 2.2 10*3/uL (ref 0.7–4.0)
MCH: 29.2 pg (ref 26.0–34.0)
MCHC: 32.4 g/dL (ref 30.0–36.0)
MCV: 90.3 fL (ref 78.0–100.0)
Monocytes Absolute: 0.8 10*3/uL (ref 0.1–1.0)
Monocytes Relative: 8 %
Neutro Abs: 7.1 10*3/uL (ref 1.7–7.7)
Neutrophils Relative %: 67 %
Platelets: 397 10*3/uL (ref 150–400)
RBC: 2.77 MIL/uL — ABNORMAL LOW (ref 3.87–5.11)
RDW: 15.9 % — ABNORMAL HIGH (ref 11.5–15.5)
WBC: 10.6 10*3/uL — ABNORMAL HIGH (ref 4.0–10.5)

## 2016-07-22 LAB — RENAL FUNCTION PANEL
Albumin: 3.1 g/dL — ABNORMAL LOW (ref 3.5–5.0)
Anion gap: 14 (ref 5–15)
BUN: 40 mg/dL — ABNORMAL HIGH (ref 6–20)
CO2: 26 mmol/L (ref 22–32)
Calcium: 10.1 mg/dL (ref 8.9–10.3)
Chloride: 95 mmol/L — ABNORMAL LOW (ref 101–111)
Creatinine, Ser: 3.9 mg/dL — ABNORMAL HIGH (ref 0.44–1.00)
GFR calc Af Amer: 13 mL/min — ABNORMAL LOW (ref >60)
GFR calc non Af Amer: 11 mL/min — ABNORMAL LOW (ref >60)
Glucose, Bld: 128 mg/dL — ABNORMAL HIGH (ref 65–99)
Phosphorus: 4.6 mg/dL (ref 2.5–4.6)
Potassium: 3.7 mmol/L (ref 3.5–5.1)
Sodium: 135 mmol/L (ref 135–145)

## 2016-07-22 NOTE — Progress Notes (Signed)
  Subjective:   Patient seen during dialysis Tolerating well    16 G needles used BFR 300 DFR 600    Objective:  Vital signs in last 24 hours:     T 97.8  RR 7 p 79  bp 144/48  Weight change:  There were no vitals filed for this visit.  Intake/Output:   No intake or output data in the 24 hours ending 07/22/16 [id number]   Physical Exam: General: NAD,   HEENT anicteric  Neck supple  Pulm/lungs Normal effort, clear to auscultation  CVS/Heart Regular, no rub or gallop  Abdomen:  Soft, PEG in place  Extremities: No edema  Neurologic: Alert, able to follow commands  Skin: No acute rashes  Access: Left forearm AVF       Basic Metabolic Panel:   Recent Labs Lab 07/16/16 0531 07/18/16 1011 07/20/16 0624 07/22/16 0547  NA 136 135 132* 135  K 3.9 3.3* 3.7 3.7  CL 96* 95* 91* 95*  CO2 28 27 32 26  GLUCOSE 134* 106* 124* 128*  BUN 21* 16 48* 40*  CREATININE 3.30* 1.99* 4.26* 3.90*  CALCIUM 9.5 8.9 10.1 10.1  PHOS  --  1.8* 5.0* 4.6     CBC:  Recent Labs Lab 07/18/16 1011 07/20/16 0624 07/22/16 0547  WBC 10.9* 11.2* 10.6*  NEUTROABS  --   --  7.1  HGB 8.6* 8.2* 8.1*  HCT 26.5* 25.4* 25.0*  MCV 89.8 89.8 90.3  PLT 423* 415* 397      Microbiology:  No results found for this or any previous visit (from the past 720 hour(s)).  Coagulation Studies: No results for input(s): LABPROT, INR in the last 72 hours.  Urinalysis: No results for input(s): COLORURINE, LABSPEC, PHURINE, GLUCOSEU, HGBUR, BILIRUBINUR, KETONESUR, PROTEINUR, UROBILINOGEN, NITRITE, LEUKOCYTESUR in the last 72 hours.  Invalid input(s): APPERANCEUR    Imaging: No results found.   Medications:       Assessment/ Plan:  65 y.o. caucasian female with medical problems of type 2 diabetes, Coronary artery disease, chronic kidney disease followed by Dr. Graylon Gunning of [place] [facility], obstructive sleep apnea, obesity, tongue cancer, status post partial glossectomy in February is   transferred from [facility].   1. End stage renal disease 2. Anemia of chronic kidney disease- Avoid ESA due to recent malignancy 3. SHPTH. pth 326  (07/2015)  Plan: continue MWF schedule. 3 hrs. bfr 300. dfr 600 She has a left forearm AVF- use 16 G needles No UF as patient appears euvolemic Low phos tube feeds, recent level 4.6 No ESA due to recent cancer. Will need blood transfusion as necessary D/c hydralazine, increase ARB   LOS: 0 Rieley Khalsa 12/8/20179:27 AM

## 2016-07-25 LAB — RENAL FUNCTION PANEL
ALBUMIN: 3.1 g/dL — AB (ref 3.5–5.0)
ANION GAP: 12 (ref 5–15)
BUN: 64 mg/dL — ABNORMAL HIGH (ref 6–20)
CO2: 29 mmol/L (ref 22–32)
Calcium: 9.9 mg/dL (ref 8.9–10.3)
Chloride: 91 mmol/L — ABNORMAL LOW (ref 101–111)
Creatinine, Ser: 4.63 mg/dL — ABNORMAL HIGH (ref 0.44–1.00)
GFR calc Af Amer: 11 mL/min — ABNORMAL LOW (ref >60)
GFR, EST NON AFRICAN AMERICAN: 9 mL/min — AB (ref >60)
Glucose, Bld: 132 mg/dL — ABNORMAL HIGH (ref 65–99)
PHOSPHORUS: 5.5 mg/dL — AB (ref 2.5–4.6)
POTASSIUM: 3.7 mmol/L (ref 3.5–5.1)
Sodium: 132 mmol/L — ABNORMAL LOW (ref 135–145)

## 2016-07-25 LAB — CBC
HEMATOCRIT: 24 % — AB (ref 36.0–46.0)
HEMOGLOBIN: 7.9 g/dL — AB (ref 12.0–15.0)
MCH: 29.6 pg (ref 26.0–34.0)
MCHC: 32.9 g/dL (ref 30.0–36.0)
MCV: 89.9 fL (ref 78.0–100.0)
Platelets: 380 10*3/uL (ref 150–400)
RBC: 2.67 MIL/uL — ABNORMAL LOW (ref 3.87–5.11)
RDW: 16.1 % — ABNORMAL HIGH (ref 11.5–15.5)
WBC: 9.5 10*3/uL (ref 4.0–10.5)

## 2016-07-25 NOTE — Progress Notes (Signed)
  Subjective:  Patient seen and evaluated during hemodialysis. She appears to be tolerating this well. She is sitting up in a chair at the moment.      Objective:  Vital signs in last 24 hours:  Temperature 97.7 pulse 89 respirations 16 blood pressure 141/74    Physical Exam: General: NAD,   HEENT anicteric  Neck supple  Pulm/lungs Normal effort, clear to auscultation  CVS/Heart Regular, no rub or gallop  Abdomen:  Soft, PEG in place  Extremities: No edema  Neurologic: Alert, able to follow commands  Skin: No acute rashes  Access: Left forearm AVF       Basic Metabolic Panel:   Recent Labs Lab 07/20/16 0624 07/22/16 0547 07/25/16 0727  NA 132* 135 132*  K 3.7 3.7 3.7  CL 91* 95* 91*  CO2 32 26 29  GLUCOSE 124* 128* 132*  BUN 48* 40* 64*  CREATININE 4.26* 3.90* 4.63*  CALCIUM 10.1 10.1 9.9  PHOS 5.0* 4.6 5.5*     CBC:  Recent Labs Lab 07/20/16 0624 07/22/16 0547 07/25/16 0727  WBC 11.2* 10.6* 9.5  NEUTROABS  --  7.1  --   HGB 8.2* 8.1* 7.9*  HCT 25.4* 25.0* 24.0*  MCV 89.8 90.3 89.9  PLT 415* 397 380      Microbiology:  No results found for this or any previous visit (from the past 720 hour(s)).  Coagulation Studies: No results for input(s): LABPROT, INR in the last 72 hours.  Urinalysis: No results for input(s): COLORURINE, LABSPEC, PHURINE, GLUCOSEU, HGBUR, BILIRUBINUR, KETONESUR, PROTEINUR, UROBILINOGEN, NITRITE, LEUKOCYTESUR in the last 72 hours.  Invalid input(s): APPERANCEUR    Imaging: No results found.   Medications:       Assessment/ Plan:  65 y.o. caucasian female with medical problems of type 2 diabetes, Coronary artery disease, chronic kidney disease followed by Dr. Graylon Gunning of [place] [facility], obstructive sleep apnea, obesity, tongue cancer, status post partial glossectomy in February is  transferred from [facility].   1. End stage renal disease 2. Anemia of chronic kidney disease- Avoid ESA due to recent  malignancy 3. SHPTH. pth 326  (07/2015)  Plan: Patient seen and evaluated during hemodialysis. We have been keeping very conservative ultrafiltration targets as the patient appears euvolemic. We plan to complete dialysis today and we will plan for hemodialysis again on Wednesday. As previously we are avoiding erythropoietin stimulating agents given recent malignancy. We will follow along with you.   LOS: 0 Isiah Scheel 12/11/20174:01 PM

## 2016-07-27 ENCOUNTER — Encounter: Payer: Self-pay | Admitting: Vascular Surgery

## 2016-07-27 DIAGNOSIS — T82898A Other specified complication of vascular prosthetic devices, implants and grafts, initial encounter: Secondary | ICD-10-CM

## 2016-07-27 LAB — RENAL FUNCTION PANEL
ALBUMIN: 3.4 g/dL — AB (ref 3.5–5.0)
Anion gap: 13 (ref 5–15)
BUN: 51 mg/dL — AB (ref 6–20)
CHLORIDE: 93 mmol/L — AB (ref 101–111)
CO2: 28 mmol/L (ref 22–32)
CREATININE: 3.93 mg/dL — AB (ref 0.44–1.00)
Calcium: 10.3 mg/dL (ref 8.9–10.3)
GFR calc Af Amer: 13 mL/min — ABNORMAL LOW (ref >60)
GFR, EST NON AFRICAN AMERICAN: 11 mL/min — AB (ref >60)
GLUCOSE: 130 mg/dL — AB (ref 65–99)
POTASSIUM: 4.2 mmol/L (ref 3.5–5.1)
Phosphorus: 5.2 mg/dL — ABNORMAL HIGH (ref 2.5–4.6)
Sodium: 134 mmol/L — ABNORMAL LOW (ref 135–145)

## 2016-07-27 LAB — CBC
HEMATOCRIT: 25.8 % — AB (ref 36.0–46.0)
Hemoglobin: 8.5 g/dL — ABNORMAL LOW (ref 12.0–15.0)
MCH: 29.5 pg (ref 26.0–34.0)
MCHC: 32.9 g/dL (ref 30.0–36.0)
MCV: 89.6 fL (ref 78.0–100.0)
PLATELETS: 440 10*3/uL — AB (ref 150–400)
RBC: 2.88 MIL/uL — ABNORMAL LOW (ref 3.87–5.11)
RDW: 15.9 % — AB (ref 11.5–15.5)
WBC: 12.1 10*3/uL — ABNORMAL HIGH (ref 4.0–10.5)

## 2016-07-27 NOTE — Progress Notes (Signed)
  Subjective:  Patient seen and evaluated during hemodialysis. Blood flow rate currently 300. Her axis does not appear to be fully developed per dialysis nurse. In addition there has been some difficulty with her axis as an outpatient. She has not been able to obtain former cc per minute of blood flow rate as an outpatient.   Objective:  Vital signs in last 24 hours:  Temperature 97.7 pulse 86 respirations 20 blood pressure 129/64    Physical Exam: General: NAD,   HEENT anicteric  Neck supple  Pulm/lungs Normal effort, clear to auscultation  CVS/Heart Regular, no rub or gallop  Abdomen:  Soft, PEG in place  Extremities: No edema  Neurologic: Alert, able to follow commands  Skin: No acute rashes  Access: Left forearm AVF       Basic Metabolic Panel:   Recent Labs Lab 07/22/16 0547 07/25/16 0727 07/27/16 0844  NA 135 132* 134*  K 3.7 3.7 4.2  CL 95* 91* 93*  CO2 26 29 28   GLUCOSE 128* 132* 130*  BUN 40* 64* 51*  CREATININE 3.90* 4.63* 3.93*  CALCIUM 10.1 9.9 10.3  PHOS 4.6 5.5* 5.2*     CBC:  Recent Labs Lab 07/22/16 0547 07/25/16 0727 07/27/16 0844  WBC 10.6* 9.5 12.1*  NEUTROABS 7.1  --   --   HGB 8.1* 7.9* 8.5*  HCT 25.0* 24.0* 25.8*  MCV 90.3 89.9 89.6  PLT 397 380 440*      Microbiology:  No results found for this or any previous visit (from the past 720 hour(s)).  Coagulation Studies: No results for input(s): LABPROT, INR in the last 72 hours.  Urinalysis: No results for input(s): COLORURINE, LABSPEC, PHURINE, GLUCOSEU, HGBUR, BILIRUBINUR, KETONESUR, PROTEINUR, UROBILINOGEN, NITRITE, LEUKOCYTESUR in the last 72 hours.  Invalid input(s): APPERANCEUR    Imaging: No results found.   Medications:       Assessment/ Plan:  65 y.o. caucasian female with medical problems of type 2 diabetes, Coronary artery disease, chronic kidney disease followed by Dr. Graylon Gunning of [place] [facility], obstructive sleep apnea, obesity, tongue cancer,  status post partial glossectomy in February is  transferred from [facility].   1. End stage renal disease 2. Anemia of chronic kidney disease- Avoid ESA due to recent malignancy 3. SHPTH. pth 326  (07/2015)  Plan: Patient seen and evaluated during hemodialysis. She appears to be tolerating this quite well. Her hemodialysis accesses not fully developed. There has been some difficulty with her axis as an outpatient has had multiple rates cannot be achieved. Therefore we will obtain a fistulogram prior to discharge.We will plan for hemodialysis again on Friday.   LOS: 0 Jayse Hodkinson 12/13/20173:48 PM

## 2016-07-27 NOTE — Progress Notes (Unsigned)
Referring Physician: Dr Holley Raring  Patient name: Tami Elliott MRN: [id number] DOB: 10-Jan-1951 Sex: female  REASON FOR CONSULT: poorly maturing AVF  HPI: Tami Elliott is a 65 y.o. female, who had left radial cephalic AVF placed by Dr Trula Slade 9/16.  She is currently in [facility] recovering from glossectomy and neck dissection.  They have had trouble cannulating her fistula.  She dialyzes MWF. Other medical problems include obesity, diabetes, hypertension, sleep apnea all of which are stable.   Past Medical History:  Diagnosis Date  . Acute pulmonary edema ([place]) 08/03/2015  . Anemia   . Anorectal ulcer 10/23/2015   and thrombosed internal hemorrhoids. with hematochezia  . Arthritis   . Back pain    myofascial, excacerberated in 6-7/09, 9/09- required narcotics at those times  . Cataract    Left eye( Dr. Katy Fitch, patient uninsured so she can't  get  an intervention)  . Depression    better on nortriptyline  . Diabetes mellitus   . Elbow fracture, right   . ESRD (end stage renal disease) ([place]) 09/2015   started HD in  09/2015. nephrolithiasis, s/p removal 12/05/07  . GERD (gastroesophageal reflux disease)   . Hyperplastic colon polyp 2001   f/u colonoscopy 11/23/06 was normal( Dr. Carlean Purl), falls into normal risk screening( next colonoscopy due in 2018)  . Hypertension   . Hypothyroidism   . Nephrolithiasis 11/2007, 01/2012  . Obesity   . Pap smear for cervical cancer screening    12/14/2005: normal, evidence of candida. 05/16/2007: normal, benign reperative changes.  . Pneumonia    as a child  . Sleep apnea   . Tongue cancer ([place]) 06/08/15 bx   SCC of left lateral tongue    Past Surgical History:  Procedure Laterality Date  . AV FISTULA PLACEMENT Left 05/12/2015   Procedure: ARTERIOVENOUS (AV) FISTULA CREATION;  Surgeon: Serafina Mitchell, MD;  Location: [facility];  Service: Vascular;  Laterality: Left;  . AV FISTULA PLACEMENT  05/12/15   left fore arm  . CATARACT EXTRACTION  Left   . DILATION AND CURETTAGE OF UTERUS    . FLEXIBLE SIGMOIDOSCOPY N/A 10/23/2015   Procedure: FLEXIBLE SIGMOIDOSCOPY;  Surgeon: Gatha Mayer, MD;  Location: [facility] ENDOSCOPY;  Service: Endoscopy;  Laterality: N/A;  . glossectomy Left 10/06/15   partial gossectomy and neck dissection at [facility].  1/18 neck nodes positive. SCC of tongue  . IR GENERIC HISTORICAL  07/14/2016   IR GASTROSTOMY TUBE MOD SED 07/14/2016 Corrie Mckusick, DO [facility]-INTERV RAD  . LITHOTRIPSY  11/2007    Family History  Problem Relation Age of Onset  . Diabetes Mother   . Heart disease Mother     before age 45  . Hypertension Mother     SOCIAL HISTORY: Social History   Social History  . Marital status: Married    Spouse name: N/A  . Number of children: 2  . Years of education: N/A   Occupational History  . retired    Social History Main Topics  . Smoking status: Never Smoker  . Smokeless tobacco: Never Used  . Alcohol use No  . Drug use: No  . Sexual activity: Not on file   Other Topics Concern  . Not on file   Social History Narrative   Married( 2nd marriage). Husband awaiting back surgery. BE AWARE THAT HUSBAND BROKE PAIN CONTRACT WITH [facility] SEVERAL TIMES!   Patient unemployed. Difficulty finding a jobb/c she is partially blind( but not blind enough  to have disability). She used to work in [employer].. The grandson's mother basically   Son 65 y/o , unemployed and grandson lives with her. The grandson's mother basically dropped the child off when he was 94 y/o. They rarely hear from her They moved to an apartment from their house in 12/2008.    No Known Allergies  No current outpatient prescriptions on file.   No current facility-administered medications for this visit.     ROS:   General:  No weight loss, Fever, chills  HEENT: No recent headaches, no nasal bleeding, no visual changes, no sore throat  Neurologic: No dizziness, blackouts, seizures. No recent symptoms of  stroke or mini- stroke. No recent episodes of slurred speech, or temporary blindness.  Cardiac: No recent episodes of chest pain/pressure, no shortness of breath at rest.  + shortness of breath with exertion.  Denies history of atrial fibrillation or irregular heartbeat  Vascular: No history of rest pain in feet.  No history of claudication.  No history of non-healing ulcer, No history of DVT   Pulmonary: No home oxygen, no productive cough, no hemoptysis,  No asthma or wheezing  Musculoskeletal:  [X]  Arthritis, [X]  Low back pain,  [X]  Joint pain  Hematologic:No history of hypercoagulable state.  No history of easy bleeding.  No history of anemia  Gastrointestinal: No hematochezia or melena,  No gastroesophageal reflux, no trouble swallowing  Urinary: [X]  chronic Kidney disease, [X]  on HD - [X]  MWF or []  TTHS, [ ]  Burning with urination, [ ]  Frequent urination, [ ]  Difficulty urinating;   Skin: No rashes  Psychological: No history of anxiety,  + history of depression   Physical Examination  There were no vitals filed for this visit.  There is no height or weight on file to calculate BMI.  General:  Alert and oriented, no acute distress HEENT: slurred speech no facial asymmetry Neck: No  JVD Pulmonary: Clear to auscultation bilaterally Cardiac: Regular Rate and Rhythm  Abdomen: Soft, non-tender, non-distended, obese Skin: No rash Extremity Pulses:  Left radial cephalic AVF easily palpable thrill, fistula about 6 mm diameter with compression of outflow Musculoskeletal: No deformity or edema  Neurologic: Upper and lower extremity motor 5/5 and symmetric   ASSESSMENT:  Difficulty cannulating fistula   PLAN:  Will schedule pt for fistulogram next Tuesday   Ruta Hinds, MD [facility] Office: [phone] Pager: [phone]

## 2016-07-28 ENCOUNTER — Encounter (HOSPITAL_COMMUNITY): Payer: Self-pay | Admitting: Interventional Radiology

## 2016-07-28 ENCOUNTER — Other Ambulatory Visit (HOSPITAL_COMMUNITY): Payer: Medicare Other

## 2016-07-28 HISTORY — PX: IR GENERIC HISTORICAL: IMG1180011

## 2016-07-28 MED ORDER — IOPAMIDOL (ISOVUE-300) INJECTION 61%
INTRAVENOUS | Status: AC
  Administered 2016-07-28: 30 mL via INTRAVASCULAR
  Filled 2016-07-28: qty 100

## 2016-07-29 LAB — RENAL FUNCTION PANEL
Albumin: 3.1 g/dL — ABNORMAL LOW (ref 3.5–5.0)
Anion gap: 10 (ref 5–15)
BUN: 45 mg/dL — ABNORMAL HIGH (ref 6–20)
CHLORIDE: 92 mmol/L — AB (ref 101–111)
CO2: 30 mmol/L (ref 22–32)
Calcium: 10.2 mg/dL (ref 8.9–10.3)
Creatinine, Ser: 3.52 mg/dL — ABNORMAL HIGH (ref 0.44–1.00)
GFR, EST AFRICAN AMERICAN: 15 mL/min — AB (ref >60)
GFR, EST NON AFRICAN AMERICAN: 13 mL/min — AB (ref >60)
Glucose, Bld: 107 mg/dL — ABNORMAL HIGH (ref 65–99)
POTASSIUM: 3.9 mmol/L (ref 3.5–5.1)
Phosphorus: 5.2 mg/dL — ABNORMAL HIGH (ref 2.5–4.6)
Sodium: 132 mmol/L — ABNORMAL LOW (ref 135–145)

## 2016-07-29 LAB — CBC
HEMATOCRIT: 24.4 % — AB (ref 36.0–46.0)
Hemoglobin: 8 g/dL — ABNORMAL LOW (ref 12.0–15.0)
MCH: 29.3 pg (ref 26.0–34.0)
MCHC: 32.8 g/dL (ref 30.0–36.0)
MCV: 89.4 fL (ref 78.0–100.0)
Platelets: 382 10*3/uL (ref 150–400)
RBC: 2.73 MIL/uL — AB (ref 3.87–5.11)
RDW: 16.2 % — ABNORMAL HIGH (ref 11.5–15.5)
WBC: 9.9 10*3/uL (ref 4.0–10.5)

## 2016-07-29 NOTE — Progress Notes (Signed)
Subjective:  Patient seen and evaluated during hemodialysis. She was a bit flushed. Nursing was instructed to turn off ultrafiltration temporarily. Ultrafiltration goal decrease to 1 kg.  Objective:  Vital signs in last 24 hours:  Temperature 97.5 pulse 79 respirations 16 blood pressure 132/62.    Physical Exam: General: NAD, sitting up in chair for dialysis  HEENT anicteric  Neck supple  Pulm/lungs Normal effort, clear to auscultation  CVS/Heart Regular, no rub or gallop  Abdomen:  Soft, PEG in place  Extremities: No edema  Neurologic: Alert, able to follow commands  Skin: No acute rashes  Access: Left forearm AVF       Basic Metabolic Panel:   Recent Labs Lab 07/25/16 0727 07/27/16 0844 07/29/16 0552  NA 132* 134* 132*  K 3.7 4.2 3.9  CL 91* 93* 92*  CO2 29 28 30   GLUCOSE 132* 130* 107*  BUN 64* 51* 45*  CREATININE 4.63* 3.93* 3.52*  CALCIUM 9.9 10.3 10.2  PHOS 5.5* 5.2* 5.2*     CBC:  Recent Labs Lab 07/25/16 0727 07/27/16 0844 07/29/16 0552  WBC 9.5 12.1* 9.9  HGB 7.9* 8.5* 8.0*  HCT 24.0* 25.8* 24.4*  MCV 89.9 89.6 89.4  PLT 380 440* 382      Microbiology:  No results found for this or any previous visit (from the past 720 hour(s)).  Coagulation Studies: No results for input(s): LABPROT, INR in the last 72 hours.  Urinalysis: No results for input(s): COLORURINE, LABSPEC, PHURINE, GLUCOSEU, HGBUR, BILIRUBINUR, KETONESUR, PROTEINUR, UROBILINOGEN, NITRITE, LEUKOCYTESUR in the last 72 hours.  Invalid input(s): APPERANCEUR    Imaging: Ir Dialy Shunt Intro Needle/intracath Initial W/img Left  Result Date: 07/28/2016 INDICATION: End-stage renal disease with indwelling left forearm radiocephalic fistula. This was placed in September, 2016. There has been difficulty cannulating the fistula. EXAM: IR DAILY SHUNT INTRO NEEDLE/INTRACATH INITIAL W/IMG LEFT MEDICATIONS: None. ANESTHESIA/SEDATION: None CONTRAST:  30 mL Isovue-300 FLUOROSCOPY TIME:   Fluoroscopy Time: 36 seconds. (23 mGy). COMPLICATIONS: None immediate. PROCEDURE: Informed written consent was obtained from the patient after a thorough discussion of the procedural risks, benefits and alternatives. All questions were addressed. Maximal Sterile Barrier Technique was utilized including caps, mask, sterile gowns, sterile gloves, sterile drape, hand hygiene and skin antiseptic. A timeout was performed prior to the initiation of the procedure. An 18 gauge Angiocath was introduced into cephalic vein outflow within the forearm. Fistula study was performed with visualization of venous outflow through to the level of the SVC. Proximal fistula and anastomosis with the radial artery was study during temporary compression of cephalic vein outflow to allow reflux of contrast. After the procedure the angiocath was removed and hemostasis obtained with manual compression. FINDINGS: Anastomosis between the radial artery and cephalic vein in the distal forearm is normally patent. Initial segment of the cephalic vein within the distal and mid forearm appears well mature. At the antecubital fossa, there is competing venous outflow in the deep venous system and the cephalic vein above the antecubital fossa is of smaller caliber. Venous outflow in the upper arm is normally patent and predominantly via the deep venous system. The central veins are normally patent including the SVC. IMPRESSION: Patent left radiocephalic AV fistula. Initial segment of the cephalic vein shows appropriate dilatation. There is competing venous outflow into the deep venous system at the antecubital fossa. ACCESS: This access remains amenable to future percutaneous interventions as clinically indicated. Electronically Signed   By: Aletta Edouard M.D.   On: 07/28/2016 13:02  Medications:       Assessment/ Plan:  65 y.o. caucasian female with medical problems of type 2 diabetes, Coronary artery disease, chronic kidney disease  followed by Dr. Graylon Gunning of [place] [facility], obstructive sleep apnea, obesity, tongue cancer, status post partial glossectomy in February is  transferred from [facility].   1. End stage renal disease 2. Anemia of chronic kidney disease- Avoid ESA due to recent malignancy 3. SHPTH. pth 326  (07/2015)  Plan: Ultrafiltration goal was decreased to 1 kg during dialysis. In addition ultrafiltration has been temporarily cut off. We plan to complete hemodialysis today.  Patient had fistulogram performed yesterday which shows fairly good development of the patient's access. She does have some venous outflow at the antecubital fossa however. We will plan for hemodialysis again on Monday. Outpatient planning on going.   LOS: 0 [place] 12/15/20178:59 AM

## 2016-08-01 LAB — CBC
HEMATOCRIT: 23.2 % — AB (ref 36.0–46.0)
HEMOGLOBIN: 7.9 g/dL — AB (ref 12.0–15.0)
MCH: 30.2 pg (ref 26.0–34.0)
MCHC: 34.1 g/dL (ref 30.0–36.0)
MCV: 88.5 fL (ref 78.0–100.0)
Platelets: 380 10*3/uL (ref 150–400)
RBC: 2.62 MIL/uL — ABNORMAL LOW (ref 3.87–5.11)
RDW: 16.4 % — AB (ref 11.5–15.5)
WBC: 10.7 10*3/uL — AB (ref 4.0–10.5)

## 2016-08-01 LAB — RENAL FUNCTION PANEL
ALBUMIN: 3.2 g/dL — AB (ref 3.5–5.0)
ANION GAP: 14 (ref 5–15)
BUN: 29 mg/dL — AB (ref 6–20)
CALCIUM: 9.6 mg/dL (ref 8.9–10.3)
CO2: 26 mmol/L (ref 22–32)
Chloride: 97 mmol/L — ABNORMAL LOW (ref 101–111)
Creatinine, Ser: 2.24 mg/dL — ABNORMAL HIGH (ref 0.44–1.00)
GFR calc Af Amer: 25 mL/min — ABNORMAL LOW (ref >60)
GFR, EST NON AFRICAN AMERICAN: 22 mL/min — AB (ref >60)
Glucose, Bld: 133 mg/dL — ABNORMAL HIGH (ref 65–99)
PHOSPHORUS: 2.4 mg/dL — AB (ref 2.5–4.6)
POTASSIUM: 3.9 mmol/L (ref 3.5–5.1)
SODIUM: 137 mmol/L (ref 135–145)

## 2016-08-02 SURGERY — A/V FISTULAGRAM
Anesthesia: LOCAL

## 2016-08-12 ENCOUNTER — Telehealth: Payer: Self-pay | Admitting: *Deleted

## 2016-08-12 ENCOUNTER — Encounter: Payer: Self-pay | Admitting: Radiation Oncology

## 2016-08-12 ENCOUNTER — Other Ambulatory Visit: Payer: Self-pay | Admitting: *Deleted

## 2016-08-12 NOTE — Telephone Encounter (Signed)
Oncology Nurse Navigator Documentation  Called Tami Elliott, confirmed her understanding of referral to [facility] s/p 06/21/16 surgery with Dr. Hendricks Limes [facility]. She voiced understanding of referral, indicated availability to see Dr. Isidore Moos 08/24/16 at 11:15. She stated she did not see Dentistry at [facility].  She understands I will be coordinating appt with Dr. Enrique Sack, [facility] Dental Medicine. She knows to call me with questions/concerns.  Gayleen Orem, RN, BSN, [place] [facility] Oncology Nurse [place] [facility] [place] [phone]

## 2016-08-16 ENCOUNTER — Other Ambulatory Visit: Payer: Self-pay | Admitting: *Deleted

## 2016-08-16 ENCOUNTER — Telehealth: Payer: Self-pay | Admitting: *Deleted

## 2016-08-16 DIAGNOSIS — R634 Abnormal weight loss: Secondary | ICD-10-CM

## 2016-08-16 DIAGNOSIS — C029 Malignant neoplasm of tongue, unspecified: Secondary | ICD-10-CM

## 2016-08-16 NOTE — Telephone Encounter (Signed)
Oncology Nurse Navigator Documentation  Spoke with Tami Elliott who stated she is not currently undergoing dialysis.  Gayleen Orem, RN, BSN, [place] [facility] Oncology Nurse [place] [facility] [place] [phone]

## 2016-08-16 NOTE — Telephone Encounter (Signed)
A user error has taken place: encounter opened in error, closed for administrative reasons.

## 2016-08-17 ENCOUNTER — Encounter: Payer: Self-pay | Admitting: Radiation Oncology

## 2016-08-17 ENCOUNTER — Telehealth: Payer: Self-pay | Admitting: *Deleted

## 2016-08-17 NOTE — Progress Notes (Signed)
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 Size: 2.5 cm greatest dimension Negative resection margins.  Tongue, Deep, Biopsy: Benign skeletal muscle Negative for Malignancy  Left neck, Level 1,2,3, Dissection: One of eighteen lymph nodes positive for metastatic carcinoma (1/18).  Benign left submandibular gland.   Left neck, level 2B, disection: Three lymph nodes, negative for metastasis (0,3)  06/08/16 Left Neck, Fine Needle Aspiration I (smears and ThinPrep): Squamous cell carcinoma.  Patient presented  with symptoms of: She originally presented to her primary MD because of a self palpated lump and discomfort to her Left neck. In August 2016 she felt some pressure sensation over her left epitrochlear region along with soreness on the anterior left side of the tongue and a tickle sensaion on the the left side of her throat. She then was referred to see a ENT MD.   05/24/16- Shirlean Mylar Rouchard-Plasser PA-C documents: Ms. Tami Elliott is seen today in clinic after calling to report swelling and pain in her neck. The neck pain began last week. Initially, her right neck felt tight. The pain persisted/preogressed and she now feels swelling and pain in her left neck. Described as 8/10. Also complains of left ear pain.    Nutrition Status Yes No Comments  Weight changes? [x]  []  Wt Readings from Last 3 Encounters:  08/24/16 190 lb 12.8 oz (86.5 kg)  12/31/15 229 lb (103.9 kg)  12/28/15 230 lb 3.2 oz (104.4 kg)  She has lost weight since her surgery in November.   Swallowing concerns? [x]  []  She is currently only swallowing water at the recommendation of her [facility] MD  PEG? [x]  []  Yes, she is instilling 4 cans of Nephro cans daily, but has had problems with nausea. She has tried Boost with some success, but is confused  about what to do regarding nutrition.    Referrals Yes No Comments  Social Work? [x]  []  She is referred today because of a 10/10 distress score.   Dentistry? []  [x]  She was evaluated prior to surgery  Swallowing therapy? [x]  []  She has a speech therapist that as come to her house twice since her surgery. She says this person is on hold, pending more recommendations from her MD  Nutrition? []  [x]  She is unsure about nutrition.   Med/Onc? []  [x]     Safety Issues Yes No Comments  Prior radiation? []  [x]    Pacemaker/ICD? []  [x]    Possible current pregnancy? []  [x]    Is the patient on methotrexate? []  [x]     Tobacco/Marijuana/Snuff/ETOH use: Never a smoker, no alcohol use.   Past/Anticipated interventions by otolaryngology, if any: 10/06/2015 Left Partial Glossectomy and Left Selective Neck Dissection by Fenton Malling at [facility].  06/08/16- Left Neck Fine Needle Aspiration- positive for squamous cell carcinoma performed by Dr. Fenton Malling at [facility].  06/21/16-Recurrent Squamous Cell Carcinoma of the tongue.  SELECTIVE NECK DISSECTION, GLOSSECTOMY PARTIAL,FREE FLAP RADIAL FOREARM, SPLIT THICKNESS SKIN GRAFT LEG, TRACHEOTOMY, FREE FLAP LATISSIMUS DORSI by Dr. Neomia Dear [place]   Past/Anticipated interventions by medical oncology, if any: No    Current Complaints / other details:   She has thick saliva present and a coating to her tongue today. She reports anxiety related to her illness, and has trouble sleeping at night.  08/23/16 CT Chest and Soft tissue neck CT Soft tissue of neck.  IMPRESSION: 1. Progression of disease. Large 6 cm  diameter left neck mass most likely is progressed left level 2 malignant nodal disease with extracapsular extension. Surrounding much smaller but increased and abnormal appearing lymph nodes up to 7 mm short axis individually. 2. The large left neck mass in #1 appears to involve the left carotid space and vascular patency is not evaluated in the absence of IV  contrast. 3. Small but increased and now indeterminate right level 3 lymph nodes. 4. See also Chest CT today reported separately CT Chest IMPRESSION: 1. No findings for metastatic disease involving the chest or upper abdomen. 2. Stable advanced three-vessel coronary artery calcifications. 3. Enlarged pulmonary artery suggesting pulmonary hypertension. 4. Stable mild emphysematous changes.  Dialysis patient. She discontinued dialysis. She did have dialysis while in the hospital for her surgery.  She is currently living at home with her husband. She has home health visiting her.   BP (!) 160/79 (BP Location: Right Leg)   Pulse 74   Temp 98.2 F (36.8 C)   Ht 5' 3.5" (1.613 m)   Wt 190 lb 12.8 oz (86.5 kg)   SpO2 97% Comment: room air  BMI 33.27 kg/m

## 2016-08-17 NOTE — Telephone Encounter (Signed)
Called patient to inform of CT'S on 08-23-16 @ [facility], lvm for a return call

## 2016-08-23 ENCOUNTER — Ambulatory Visit (HOSPITAL_COMMUNITY)
Admission: RE | Admit: 2016-08-23 | Discharge: 2016-08-23 | Disposition: A | Discharge status: Home / Self Care | Payer: Medicare Other | Source: Ambulatory Visit | Attending: Radiation Oncology | Admitting: Radiation Oncology

## 2016-08-23 ENCOUNTER — Ambulatory Visit (HOSPITAL_COMMUNITY): Payer: Medicare Other

## 2016-08-23 DIAGNOSIS — C029 Malignant neoplasm of tongue, unspecified: Secondary | ICD-10-CM | POA: Insufficient documentation

## 2016-08-23 DIAGNOSIS — I2510 Atherosclerotic heart disease of native coronary artery without angina pectoris: Secondary | ICD-10-CM | POA: Diagnosis not present

## 2016-08-23 DIAGNOSIS — R634 Abnormal weight loss: Secondary | ICD-10-CM | POA: Diagnosis not present

## 2016-08-23 DIAGNOSIS — R221 Localized swelling, mass and lump, neck: Secondary | ICD-10-CM | POA: Insufficient documentation

## 2016-08-24 ENCOUNTER — Ambulatory Visit
Admission: RE | Admit: 2016-08-24 | Discharge: 2016-08-24 | Disposition: A | Discharge status: Home / Self Care | Payer: Medicare Other | Source: Ambulatory Visit | Attending: Radiation Oncology | Admitting: Radiation Oncology

## 2016-08-24 ENCOUNTER — Encounter: Payer: Self-pay | Admitting: Radiation Oncology

## 2016-08-24 ENCOUNTER — Encounter: Payer: Self-pay | Admitting: *Deleted

## 2016-08-24 ENCOUNTER — Ambulatory Visit: Payer: Medicare Other | Admitting: Radiation Oncology

## 2016-08-24 VITALS — BP 160/79 | HR 74 | Temp 98.2°F | Ht 63.5 in | Wt 190.8 lb

## 2016-08-24 DIAGNOSIS — N186 End stage renal disease: Secondary | ICD-10-CM | POA: Diagnosis not present

## 2016-08-24 DIAGNOSIS — I120 Hypertensive chronic kidney disease with stage 5 chronic kidney disease or end stage renal disease: Secondary | ICD-10-CM | POA: Insufficient documentation

## 2016-08-24 DIAGNOSIS — Z510 Encounter for antineoplastic radiation therapy: Secondary | ICD-10-CM | POA: Insufficient documentation

## 2016-08-24 DIAGNOSIS — C021 Malignant neoplasm of border of tongue: Secondary | ICD-10-CM

## 2016-08-24 DIAGNOSIS — E1122 Type 2 diabetes mellitus with diabetic chronic kidney disease: Secondary | ICD-10-CM | POA: Diagnosis not present

## 2016-08-24 DIAGNOSIS — Z79899 Other long term (current) drug therapy: Secondary | ICD-10-CM | POA: Diagnosis not present

## 2016-08-24 DIAGNOSIS — Z794 Long term (current) use of insulin: Secondary | ICD-10-CM | POA: Insufficient documentation

## 2016-08-24 DIAGNOSIS — Z992 Dependence on renal dialysis: Secondary | ICD-10-CM | POA: Diagnosis not present

## 2016-08-24 HISTORY — DX: Skin transplant status: Z94.5

## 2016-08-24 HISTORY — DX: Other specified postprocedural states: Z98.890

## 2016-08-24 MED ORDER — OXYCODONE HCL 5 MG PO TABS: 5.0000 mg | ORAL_TABLET | Freq: Four times a day (QID) | ORAL | 0 refills | Status: DC | PRN

## 2016-08-24 NOTE — Progress Notes (Signed)
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 Squamous cell carcinoma of lateral tongue (HCC) 141.2 C02.1 Ambulatory referral to Social Work     oxyCODONE (OXY IR/ROXICODONE) 5 MG immediate release tablet    HISTORY OF PRESENT ILLNES::Tami Elliott is a 66 y.o. female who  presented with a neck tenderness (left) and left sided tongue/ear soreness. She was given ABX for this and magic mouthwash by her PCP.  Subsequently, the patient saw Dr. Benjamine Mola who performed biopsy.   Biopsy of Left lateral tongue on 06-08-15 revealed: Invasive ulcerated keratinzing squamous cell carcinoma.  Patient saw Dr Alvy Bimler and has many combordities including renal failure which make her a poor candidate for systemic therapy.   PET scan on 06-30-15 showed 1. Hypermetabolic lesion in the posterior LEFT tongue consistent with primary carcinoma. 2. Probable ipsilateral level IIa nodal metastasis. 3. No contralateral or distant nodal metastasis. 4. Moderate LEFT effusion small RIGHT effusion  She was seen by Dr Vicie Mutters at [facility] for surgery. This was delayed due to comorbidities. She ultimately underwent left partial glossectomy and left selective neck dissections which revealed a 2.5 cm tumor with negative margins by at least 10mm.  Well to moderately differentiated squamous cell carcinoma. 1/21 nodes were positive.  There was no LVSI, PNI or ECE.   The patient saw me for consideration of adjuvant RT in April 2017 and ultimately this was not recommended in large part due to he co morbidities and lack of convincing risk factors to warrant  the morbidity of RT, which I felt she would tolerate poorly.  Unfortunately, she developed a local recurrence, which was treated surgically by Dr Hendricks Limes at [facility] on 06-21-16 Left partial glossectomy and bilateral selective neck dissection with  Free flap reconstruction.  Tumor in the tongue was 4.1 cm, grade 3 squamous cell carcinoma with spindle cell features.  Margins were positive, and 2 nodes in the left neck, plus 2 nodes in the right neck, were positive.  + PNI, no LVSI.  She has a complicated post operative course, including complications related to her trach, which has since been removed. She missed appointments at [facility] for radiation oncology and medical oncology.  She's not had dental assessment recently.  She is deferring dialysis despite significant renal failure.   She lives with her husband, who is here today.  Home Health visits her.  She swallows clear liquid only.  Needs SLP followup.  Reports severe neck pain, needing refill on oxycodone.  Nutrition Status Yes No Comments  Weight changes?      Wt Readings from Last 3 Encounters:  08/24/16 190 lb 12.8 oz (86.5 kg)  12/31/15 229 lb (103.9 kg)  12/28/15 230 lb 3.2 oz (104.4 kg)  She has lost weight since her surgery in November.   Swallowing concerns?   She is currently only swallowing water at the recommendation of her [facility] MD    She is instilling 4 cans of Nephro cans daily, but has had problems with nausea. She has tried Boost with some success, but is confused about what to do regarding nutrition.   CT  imaging 08-24-15 no contrast is negative for chest metastases but notable for a large recurrent left neck mass - 6cm - with adjacent suspicious nodes.  Dr Hendricks Limes believes this is local recurrence from her tongue tumor.  He and I have spoken extensively about the patient.    PREVIOUS RADIATION THERAPY: No  PAST MEDICAL HISTORY:  has a past medical history of Acute pulmonary edema ([place]) (08/03/2015); Anemia; Anorectal ulcer  (10/23/2015); Arthritis; Back pain; Cataract; Depression; Diabetes mellitus; Elbow fracture, right; ESRD (end stage renal disease) ([place]) (09/2015); GERD (gastroesophageal reflux disease); H/O skin graft (06/21/2016); Hyperplastic colon polyp (2001); Hypertension; Hypothyroidism; Nephrolithiasis (11/2007, 01/2012); Obesity; Pap smear for cervical cancer screening; Pneumonia; Sleep apnea; Status post neck dissection (06/21/2016); and Tongue cancer ([place]) (06/08/15 bx).    PAST SURGICAL HISTORY: Past Surgical History:  Procedure Laterality Date  . AV FISTULA PLACEMENT Left 05/12/2015   Procedure: ARTERIOVENOUS (AV) FISTULA CREATION;  Surgeon: Serafina Mitchell, MD;  Location: [place];  Service: Vascular;  Laterality: Left;  . AV FISTULA PLACEMENT  05/12/15   left fore arm  . CATARACT EXTRACTION Left   . DILATION AND CURETTAGE OF UTERUS    . FLEXIBLE SIGMOIDOSCOPY N/A 10/23/2015   Procedure: FLEXIBLE SIGMOIDOSCOPY;  Surgeon: Gatha Mayer, MD;  Location: [facility] ENDOSCOPY;  Service: Endoscopy;  Laterality: N/A;  . free flap latissimus dorsi  06/21/2016   with neck dissection surgery  . FREE FLAP RADIAL FOREARM  06/21/2016   with neck dissection surgery  . glossectomy Left 10/06/15   partial gossectomy and neck dissection at [facility].  1/18 neck nodes positive. SCC of tongue  . IR GENERIC HISTORICAL  07/14/2016   IR GASTROSTOMY TUBE MOD SED 07/14/2016 Corrie Mckusick, DO [facility]-INTERV RAD  . IR GENERIC HISTORICAL Left 07/28/2016   IR DIALY SHUNT INTRO NEEDLE/INTRACATH INITIAL W/IMG LEFT 07/28/2016 Aletta Edouard, MD [facility]-INTERV RAD  . LITHOTRIPSY  11/2007  . NECK DISSECTION  06/21/2016   partial glossectomy  . SKIN GRAFT SPLIT THICKNESS LEG / FOOT  06/21/2016   with neck dissection surgery  . tracheotomy  06/21/2016   with neck dissection surgery    FAMILY HISTORY: family history includes Diabetes in her mother; Heart disease in her mother; Hypertension in her mother.  SOCIAL HISTORY:  reports that she has  never smoked. She has never used smokeless tobacco. She reports that she does not drink alcohol or use drugs.  ALLERGIES: Patient has no known allergies.  MEDICATIONS:  Current Outpatient Prescriptions  Medication Sig Dispense Refill  . ACCU-CHEK AVIVA PLUS test strip CHECK BLOOD SUGAR D  5  . amLODipine (NORVASC) 10 MG tablet TK 1 T PO D  12  . chlorhexidine (PERIDEX) 0.12 % solution 15 mLs.    . Cyanocobalamin (VITAMIN B-12) 1000 MCG SUBL Place 1 tablet under the tongue daily.    . hydrocortisone-pramoxine (PROCTOFOAM-HC) rectal foam Place 1 applicator rectally 2 (two) times daily. 10 g 0  . insulin glargine (LANTUS) 100 UNIT/ML injection Inject 0.25 mLs (25 Units total) into the skin at bedtime. 10 mL 11  . levothyroxine (SYNTHROID, LEVOTHROID) 150 MCG tablet Take 150 mcg by mouth daily.      . metoprolol (LOPRESSOR) 50 MG tablet Take 50 mg by mouth 2 (two) times daily.    . polyethylene glycol (MIRALAX) packet Take 17 g by mouth daily. 30 each 1  . ranitidine (ZANTAC) 300 MG tablet Take 300 mg by mouth daily as needed for heartburn.    . cetirizine (ZYRTEC) 10  MG tablet Take 10 mg by mouth daily as needed for allergies.   5  . Cholecalciferol (VITAMIN D) 2000 units tablet Take by mouth.    . DULoxetine (CYMBALTA) 30 MG capsule TK 1 C PO QAM  5  . fluticasone (FLONASE) 50 MCG/ACT nasal spray Place 1 spray into both nostrils 2 (two) times daily.    Marland Kitchen lidocaine (XYLOCAINE) 2 % jelly Apply topically.    . lidocaine (XYLOCAINE) 2 % solution 15 mLs.    Marland Kitchen oxyCODONE (OXY IR/ROXICODONE) 5 MG immediate release tablet Take 1 tablet (5 mg total) by mouth every 6 (six) hours as needed for severe pain. 50 tablet 0  . Sucroferric Oxyhydroxide (VELPHORO PO) Take 1 tablet by mouth. With each meal    . vitamin C (ASCORBIC ACID) 500 MG tablet Take 500 mg by mouth daily.     No current facility-administered medications for this encounter.     REVIEW OF SYSTEMS:  Notable for that above.   PHYSICAL  EXAM:  height is 5' 3.5" (1.613 m) and weight is 190 lb 12.8 oz (86.5 kg). Her temperature is 98.2 F (36.8 C). Her blood pressure is 160/79 (abnormal) and her pulse is 74. Her oxygen saturation is 97%.   General: Alert and oriented, ill appearing, in mild distress; speech is understandable, in wheelchair HEENT: Head is normocephalic. Extraocular movements are intact. Oropharynx is notable for flap reconstruction, left oral tongue, no thrush or obvious tumor. Neck: Neck is notable for satisfactory healing from surgery with some post operative lymphedema, and a bulky left neck mass. Skin: healed from surgery  Heart: RRR Chest: decreased breath sounds throughout Abd: soft, NT/ND Ext: no edema Neuro: non focal Lymph: see neck exam Psychiatric: Judgment and insight are intact. Affect is tearful at times.   ECOG = 3  0 - Asymptomatic (Fully active, able to carry on all predisease activities without restriction)  1 - Symptomatic but completely ambulatory (Restricted in physically strenuous activity but ambulatory and able to carry out work of a light or sedentary nature. For example, light housework, office work)  2 - Symptomatic, <50% in bed during the day (Ambulatory and capable of all self care but unable to carry out any work activities. Up and about more than 50% of waking hours)  3 - Symptomatic, >50% in bed, but not bedbound (Capable of only limited self-care, confined to bed or chair 50% or more of waking hours)  4 - Bedbound (Completely disabled. Cannot carry on any self-care. Totally confined to bed or chair)  5 - Death   Eustace Pen MM, Creech RH, Tormey DC, et al. [phone]). "Toxicity and response criteria of the [facility]". [place] Oncol. 5 (6): 649-55   LABORATORY DATA:  Lab Results  Component Value Date   WBC 10.7 (H) 08/01/2016   HGB 7.9 (L) 08/01/2016   HCT 23.2 (L) 08/01/2016   MCV 88.5 08/01/2016   PLT 380 08/01/2016   CMP     Component  Value Date/Time   NA 137 08/01/2016 0826   K 3.9 08/01/2016 0826   CL 97 (L) 08/01/2016 0826   CO2 26 08/01/2016 0826   GLUCOSE 133 (H) 08/01/2016 0826   BUN 29 (H) 08/01/2016 0826   CREATININE 2.24 (H) 08/01/2016 0826   CALCIUM 9.6 08/01/2016 0826   CALCIUM 9.0 07/13/2016 1030   PROT 6.9 07/09/2016 0624   ALBUMIN 3.2 (L) 08/01/2016 0826   AST 26 07/09/2016 0624   ALT 16 07/09/2016 [id number]  ALKPHOS 105 07/09/2016 0624   BILITOT 0.5 07/09/2016 0624   GFRNONAA 22 (L) 08/01/2016 0826   GFRAA 25 (L) 08/01/2016 0826      Lab Results  Component Value Date   TSH 3.242 10/26/2015       RADIOGRAPHY images reviewed by me: Ct Soft Tissue Neck Wo Contrast  Result Date: 08/23/2016 CLINICAL DATA:  66 year old female. Head and neck cancer with lymph node involvement. Status post treatment including partial glossectomy, node dissection. Surgery in November 2017. Malpositioned tracheostomy tube on postoperative neck CT 07/09/2016. Subsequent encounter. EXAM: CT NECK WITHOUT CONTRAST TECHNIQUE: Multidetector CT imaging of the neck was performed following the standard protocol without intravenous contrast. COMPARISON:  Neck CT 07/09/2016 and earlier. FINDINGS: Pharynx and larynx: Tracheostomy and NG tubes have removed. The glottis is closed. No discrete laryngeal mass is identified. Asymmetric effacement of the left piriform sinus no discrete pharyngeal mass. Negative parapharyngeal spaces. Trace retropharyngeal effusion. Salivary glands: Sequelae of sublingual and submandibular space resection with flap reconstruction again noted. Aside from the large left neck mass described in the node section below there is a stable postoperative appearance of the spaces. Numerous associated surgical clips. Both submandibular glands are surgically absent. The right parotid gland is stable and normal. The left parotid gland itself remains normal although there are New abnormal lymph nodes adjacent to the posterior and  inferior left parotid space are noted. There is a left intra parotid nodes superiorly which is increased in conspicuity (series 12, image 18), but maintains a fatty hilum. Thyroid: Negatives, postoperative changes along the left and anterior thyroid. Lymph nodes: Large 54 x 54 x 60 mm left neck mass (AP by transverse by CC) probably reflect progression of the 14 mm malignant appearing left level IIb node seen on the prior study. Epicenter is on series 12, image 39. This outwardly displaces regional surgical clips. There are increased and abnormal appearing smaller lymph nodes along the periphery of this mass, individually up to 7 mm short axis (e.g. posterior to the inferior left parotid lobe on series 12, image 31). Small right level 2 lymph nodes are stable and appear normal. Mildly increased right level 3 lymph nodes (series 12, images 59-65) but indeterminate. Vascular: Vascular patency is not evaluated in the absence of IV contrast. The left carotid space appears directly involved by the large left neck mass. Limited intracranial: Negative. Calcified atherosclerosis at the skull base. Visualized orbits: Negative. Mastoids and visualized paranasal sinuses: Visualized paranasal sinuses and mastoids are stable and well pneumatized. Skeleton: Osteopenia.  No osseous metastatic disease Upper chest: See chest CT reported separately today. IMPRESSION: 1. Progression of disease. Large 6 cm diameter left neck mass most likely is progressed left level 2 malignant nodal disease with extracapsular extension. Surrounding much smaller but increased and abnormal appearing lymph nodes up to 7 mm short axis individually. 2. The large left neck mass in #1 appears to involve the left carotid space and vascular patency is not evaluated in the absence of IV contrast. 3. Small but increased and now indeterminate right level 3 lymph nodes. 4. See also Chest CT today reported separately. Electronically Signed   By: Genevie Ann M.D.   On:  08/23/2016 13:41   Ct Chest Wo Contrast  Result Date: 08/23/2016 CLINICAL DATA:  Restaging tounge cancer. EXAM: CT CHEST WITHOUT CONTRAST TECHNIQUE: Multidetector CT imaging of the chest was performed following the standard protocol without IV contrast. COMPARISON:  07/09/2016 FINDINGS: Chest wall: No breast masses, supraclavicular or axillary  lymphadenopathy. Small scattered lymph nodes are stable. Stable surgical changes in the left supraclavicular area. The tracheostomy tube is been removed. Cardiovascular: The heart is normal in size and stable. No pericardial effusion. Prominent pericardial fat. Stable extensive three-vessel coronary artery calcifications. The aorta demonstrates stable tortuosity but no aneurysm or significant atherosclerotic calcifications. The pulmonary arteries are enlarged suggesting pulmonary hypertension. Mediastinum/Nodes: Small scattered mediastinal and hilar lymph nodes are stable. No mass or overt adenopathy. The esophagus is grossly normal. Lungs/Pleura: No acute pulmonary findings. No infiltrates, edema or effusions. No pulmonary nodules to suggest metastatic disease. Upper Abdomen: No findings for upper abdominal metastatic disease. No obvious liver or adrenal gland lesions. Musculoskeletal: No significant bony findings. Stable degenerative changes involving the spine. IMPRESSION: 1. No findings for metastatic disease involving the chest or upper abdomen. 2. Stable advanced three-vessel coronary artery calcifications. 3. Enlarged pulmonary artery suggesting pulmonary hypertension. 4. Stable mild emphysematous changes. Electronically Signed   By: Marijo Sanes M.D.   On: 08/23/2016 16:41   Ir Dialy Shunt Intro Needle/intracath Initial W/img Left  Result Date: 07/28/2016 INDICATION: End-stage renal disease with indwelling left forearm radiocephalic fistula. This was placed in September, 2016. There has been difficulty cannulating the fistula. EXAM: IR DAILY SHUNT INTRO  NEEDLE/INTRACATH INITIAL W/IMG LEFT MEDICATIONS: None. ANESTHESIA/SEDATION: None CONTRAST:  30 mL Isovue-300 FLUOROSCOPY TIME:  Fluoroscopy Time: 36 seconds. (23 mGy). COMPLICATIONS: None immediate. PROCEDURE: Informed written consent was obtained from the patient after a thorough discussion of the procedural risks, benefits and alternatives. All questions were addressed. Maximal Sterile Barrier Technique was utilized including caps, mask, sterile gowns, sterile gloves, sterile drape, hand hygiene and skin antiseptic. A timeout was performed prior to the initiation of the procedure. An 18 gauge Angiocath was introduced into cephalic vein outflow within the forearm. Fistula study was performed with visualization of venous outflow through to the level of the SVC. Proximal fistula and anastomosis with the radial artery was study during temporary compression of cephalic vein outflow to allow reflux of contrast. After the procedure the angiocath was removed and hemostasis obtained with manual compression. FINDINGS: Anastomosis between the radial artery and cephalic vein in the distal forearm is normally patent. Initial segment of the cephalic vein within the distal and mid forearm appears well mature. At the antecubital fossa, there is competing venous outflow in the deep venous system and the cephalic vein above the antecubital fossa is of smaller caliber. Venous outflow in the upper arm is normally patent and predominantly via the deep venous system. The central veins are normally patent including the SVC. IMPRESSION: Patent left radiocephalic AV fistula. Initial segment of the cephalic vein shows appropriate dilatation. There is competing venous outflow into the deep venous system at the antecubital fossa. ACCESS: This access remains amenable to future percutaneous interventions as clinically indicated. Electronically Signed   By: Aletta Edouard M.D.   On: 07/28/2016 13:02      IMPRESSION/PLAN:   Tami Elliott  unfortunately presents with recurrent, advanced, aggressive tongue cancer.  She did not receive adjuvant therapy two months ago after her resection in November and now has a bulky (third) recurrence in the left neck. She presents for salvage vs palliative options.  Her case is complicated by a poor ECOG PS of 3 at best, and advanced renal disease for which she defers dialysis.  She is not a chemotherapy candidate per my discussion with med/onc.  We discussed her goals of care in detail, and her options, which range from surgical re-resection -->  RT vs RT alone x 7 weeks, vs RT palliatively x2-4 weeks, vs hospice. We are not sure if surgical resection is possible and would need an MRI to determine this.  I think she would tolerate surgery quite poorly, and aggressive radiotherapy quite poorly, and have a miniscule chance of cure with such morbid treatments.  She and I agree that the best option at this juncture is palliative radiotherapy, possibly to be followed by hospice.  We offered her a simulation today, but she is opting instead for simulation in 2 days due to feeling poorly this PM.  We discussed the potential risks, benefits, and side effects of radiotherapy. We talked in detail about acute and late effects. We discussed that some of the most bothersome acute effects may be mucositis, dysgeusia, salivary changes, skin irritation, hair loss, dehydration, weight loss and fatigue. We talked about late effects which include but are not necessarily limited to dysphagia, hypothyroidism,  xerostomia,  and neck edema. No guarantees of treatment were given. A consent form was signed and placed in the patient's medical record. The patient is agreeable about proceeding with treatment. I look forward to participating in the patient's care.    Simulation (treatment planning) will take place in 2 days  We also discussed that the treatment of head and neck cancer is a multidisciplinary process to maximize  treatment outcomes and quality of life. For this reasons the following referrals have been or will be made:    Nutritionist for nutrition support during and after treatment.    Speech language pathology for swallowing and/or speech therapy.    Social work for social support.     Physical therapy due to risk of lymphedema in neck and deconditioning.    Baseline labs including TSH.   __________________________________________   Eppie Gibson, MD  This document serves as a record of services personally performed by Eppie Gibson, MD. It was created on her behalf by Darcus Austin, a trained medical scribe. The creation of this record is based on the scribe's personal observations and the provider's statements to them. This document has been checked and approved by the attending provider.

## 2016-08-25 ENCOUNTER — Telehealth: Payer: Self-pay | Admitting: *Deleted

## 2016-08-25 NOTE — Telephone Encounter (Signed)
Oncology Nurse Navigator Documentation  Spoke with patient husband, informed CT SIM scheduled for tomorrow 11:00, requested 10:45 arrival to Radiation Waiting following registration.  He voiced understanding.  Gayleen Orem, RN, BSN, [place] [facility] Oncology Nurse [place] [facility] [phone]

## 2016-08-26 ENCOUNTER — Ambulatory Visit: Payer: Medicare Other | Admitting: Nutrition

## 2016-08-26 ENCOUNTER — Ambulatory Visit: Payer: Medicare Other | Admitting: Nurse Practitioner

## 2016-08-26 ENCOUNTER — Encounter: Payer: Self-pay | Admitting: *Deleted

## 2016-08-26 ENCOUNTER — Ambulatory Visit
Admission: RE | Admit: 2016-08-26 | Discharge: 2016-08-26 | Disposition: A | Discharge status: Home / Self Care | Payer: Medicare Other | Source: Ambulatory Visit | Attending: Radiation Oncology | Admitting: Radiation Oncology

## 2016-08-26 ENCOUNTER — Other Ambulatory Visit: Payer: Self-pay | Admitting: Radiation Oncology

## 2016-08-26 DIAGNOSIS — C021 Malignant neoplasm of border of tongue: Secondary | ICD-10-CM

## 2016-08-26 DIAGNOSIS — R634 Abnormal weight loss: Secondary | ICD-10-CM

## 2016-08-26 DIAGNOSIS — Z510 Encounter for antineoplastic radiation therapy: Secondary | ICD-10-CM | POA: Diagnosis not present

## 2016-08-26 NOTE — Progress Notes (Signed)
Nutrition follow-up completed with patient and her husband.  Patient is status post CT scan for adjuvant radiation therapy for tongue cancer.  Patient is status post left glossectomy and left selective neck dissection now with local recurrence.  Weight documented as 190.8 pounds on January 10 decreased from 229 pounds May 2017. Patient has feeding tube and has been tolerating Nepro 4 cans daily via tube.  Currently, patient is vomiting after infusing Nepro through the feeding tube.  Patient also reports she is out of nausea medication.  Patient allowed to drink water by mouth. Labs reviewed and noted glucose 133, BUN 29, creatinine 2.24, phosphorus 2.4, and albumin 3.2 on December 18. Patient reports she no longer has dialysis.  Estimated nutrition needs: 2000-2200 calories, 90-105 g protein, 2.2 L fluid.  Nutrition diagnosis:  Unintended weight loss related to recurrent tongue cancer as evidenced by 17% weight loss since May 2017.  Intervention: Educated patient to take nausea medication as prescribed. Recommended patient try half a bottle of Nepro, at room temperature given slowly over 10 minutes to determine if this improves nausea. If Nepro is not tolerated, recommended patient try Jevity 1.5, working up to 6 cans daily. Provided samples for patient to try over the weekend and assess tolerance. I asked patient's husband to document free water intake along with water by mouth to assess fluid status. Encouraged patient or husband to contact me on Monday so we could review tolerance of tube feeding.  Monitoring, evaluation, goals:  Patient will tolerate tube feeding at goal rate without nausea for weight stabilization.  Next visit: Monday, January 15.  **Disclaimer: This note was dictated with voice recognition software. Similar sounding words can inadvertently be transcribed and this note may contain transcription errors which may not have been corrected upon publication of  note.**

## 2016-08-26 NOTE — Progress Notes (Signed)
Head and Neck Cancer Simulation, IMRT treatment planning note   outpatient  Diagnosis:    ICD-9-CM ICD-10-CM   1. Squamous cell carcinoma of lateral tongue (HCC) 141.2 C02.1     The patient was taken to the CT simulator and laid in the supine position on the table. An Aquaplast head and shoulder mask was custom fitted to the patient's anatomy. High-resolution CT axial imaging was obtained of the head and neck with contrast. I verified that the quality of the imaging is good for treatment planning. 1 Medically Necessary Treatment Device was fabricated and supervised by me: Aquaplast mask.  Treatment planning note I plan to treat the patient with IMRT. I plan to treat the patient's left neck tumor palliatively. I plan to treat to a total dose of 41.25 Gray in 15  fractions. Dose calculation was ordered from dosimetry.  IMRT planning Note  IMRT is medically necessary and an important modality to deliver adequate dose to the patient's at risk tissues while sparing the patient's normal structures, including the: esophagus, parotid tissue, mandible, brain stem, spinal cord, oral cavity.  This justifies the use of IMRT in the patient's treatment.    -----------------------------------  Eppie Gibson, MD

## 2016-08-26 NOTE — Progress Notes (Signed)
Oncology Nurse Navigator Documentation  Met with Tami Elliott during f/u consult with Dr. Isidore Moos.  She was accompanied by her husband.   She was previously seen in April '17.  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 1. Re-introduced myself as their Navigator, explained my role as a member of the Care Team.   2. Provided New Patient Information packet, discussed contents:  Contact information for physician(s), myself, other members of the Care Team.  Advance Directive information ([place] blue pamphlet with LCSW contact info)  Fall Prevention Patient Safety Plan  Appointment [place] sheet  [place] campus map with highlight of [place] 3. Provided introductory explanation of radiation treatment including SIM planning and purpose of Aquaplast head and shoulder mask, showed them example.   4. They voiced understanding of Dr. Pearlie Oyster discussion of yesterday's CTs, indication of disease recurrence.  They agreed to plan for palliative RT. 5. I encouraged them to contact me with questions/concerns as treatments/procedures begin.  They verbalized understanding of information provided.    Gayleen Orem, RN, BSN, [place] Neck Oncology Nurse [place] at [place] [phone]

## 2016-08-29 ENCOUNTER — Telehealth: Payer: Self-pay | Admitting: Nutrition

## 2016-08-29 ENCOUNTER — Telehealth: Payer: Self-pay | Admitting: *Deleted

## 2016-08-29 ENCOUNTER — Encounter: Payer: Self-pay | Admitting: Nutrition

## 2016-08-29 DIAGNOSIS — C029 Malignant neoplasm of tongue, unspecified: Secondary | ICD-10-CM

## 2016-08-29 MED ORDER — OSMOLITE 1.5 CAL PO LIQD: ORAL | 0 refills | Status: AC

## 2016-08-29 NOTE — Telephone Encounter (Signed)
I received a call from patient's husband regarding tube feeding for tongue cancer. Patient's husband reports patient is tolerating Osmolite 1.5 - 4 cans daily without vomiting. Patient and husband are in agreement to switch tube feedings to Osmolite 1.5. They understand goal rate is 6 cans a day. Patient is no longer on dialysis. Labs were reviewed.  Estimated nutrition needs: 2000-2200 calories, 90-105 grams protein, 2.2 L fluid.  Nutrition diagnosis: Unintended weight loss cannot be evaluated.  Intervention: Educated patient and husband to provide 1-1/2 cans Osmolite 1.5 4 times a day with 60 cc free water before and after bolus feedings. Patient should drink or flush feeding tube with an additional 650 cc free water. Tube feeding at goal rate will provide 2130 cal, 89.4 g protein, 1566 mL free water which is greater than 90% of estimated nutrition needs. Orders written and advanced homecare was notified.  Monitoring, evaluation, goals: Patient will tolerate tube feeding without nausea, vomiting, to provide greater than 90% of estimated needs.  Next visit: To be scheduled as needed.  **Disclaimer: This note was dictated with voice recognition software. Similar sounding words can inadvertently be transcribed and this note may contain transcription errors which may not have been corrected upon publication of note.**

## 2016-08-29 NOTE — Telephone Encounter (Signed)
Oncology Nurse Navigator Documentation  Rec'd call from [place] who was visiting Ms. Zammit.   She reported increased L facial swelling since last Friday.  Ms. Poppa is NOT experiencing airway obstruction/having breathing difficulty. I notified Dr. Isidore Moos who indicated she should go to ER if airway obstruction develops, otherwise will see her on Friday during [facility] RT.   I called patient home, spoke with husband.  He voiced understanding.  Gayleen Orem, RN, BSN, [place] Neck Oncology Nurse [place] at [place] [phone]

## 2016-08-29 NOTE — Progress Notes (Signed)
Oncology Nurse Navigator Documentation  Met with Tami Elliott during her CT SIM to provide continuity of care, support.  She was accompanied by her husband. She tolerated procedure without difficulty. Following SIM, I escorted them to Nutrition where they met with Dory Peru. They understand to contact me with needs/concerns.  Gayleen Orem, RN, BSN, [place] Neck Oncology Nurse [place] at [place] [phone]

## 2016-08-30 ENCOUNTER — Ambulatory Visit: Payer: Medicare Other | Attending: Radiation Oncology | Admitting: Physical Therapy

## 2016-08-30 ENCOUNTER — Telehealth: Payer: Self-pay | Admitting: *Deleted

## 2016-08-30 ENCOUNTER — Ambulatory Visit: Payer: Medicare Other | Admitting: Physical Therapy

## 2016-08-30 DIAGNOSIS — Z483 Aftercare following surgery for neoplasm: Secondary | ICD-10-CM

## 2016-08-30 DIAGNOSIS — M6281 Muscle weakness (generalized): Secondary | ICD-10-CM

## 2016-08-30 DIAGNOSIS — R1310 Dysphagia, unspecified: Secondary | ICD-10-CM | POA: Insufficient documentation

## 2016-08-30 DIAGNOSIS — R29898 Other symptoms and signs involving the musculoskeletal system: Secondary | ICD-10-CM | POA: Diagnosis present

## 2016-08-30 DIAGNOSIS — I890 Lymphedema, not elsewhere classified: Secondary | ICD-10-CM | POA: Diagnosis not present

## 2016-08-30 DIAGNOSIS — R2681 Unsteadiness on feet: Secondary | ICD-10-CM | POA: Diagnosis present

## 2016-08-30 NOTE — Telephone Encounter (Signed)
CALLED PATIENT TO INFORM OF LAB FOR 09-01-16 @ 12:30 PM, SPOKE WITH PATIENT AND SHE IS AWARE OF THIS APPT.

## 2016-08-30 NOTE — Therapy (Signed)
[place], [place], [address] Phone: [phone]   Fax:  [phone]  Physical Therapy Evaluation  Patient Details  Name: Tami Elliott MRN: [id number] Date of Birth: 1951-06-27 Referring Provider: Dr. Eppie Gibson  Encounter Date: 08/30/2016      PT End of Session - 08/30/16 1711    Visit Number 1   Number of Visits 9   Date for PT Re-Evaluation 10/28/16   PT Start Time 1600   PT Stop Time 1655   PT Time Calculation (min) 55 min   Activity Tolerance Patient tolerated treatment well   Behavior During Therapy [facility] for tasks assessed/performed      Past Medical History:  Diagnosis Date  . Acute pulmonary edema ([place]) 08/03/2015  . Anemia   . Anorectal ulcer 10/23/2015   and thrombosed internal hemorrhoids. with hematochezia  . Arthritis   . Back pain    myofascial, excacerberated in 6-7/09, 9/09- required narcotics at those times  . Cataract    Left eye( Dr. Katy Fitch, patient uninsured so she can't  get  an intervention)  . Depression    better on nortriptyline  . Diabetes mellitus   . Elbow fracture, right   . ESRD (end stage renal disease) ([place]) 09/2015   started HD in  09/2015. nephrolithiasis, s/p removal 12/05/07  . GERD (gastroesophageal reflux disease)   . H/O skin graft 06/21/2016   splint thickness skin graft leg  . Hyperplastic colon polyp 2001   f/u colonoscopy 11/23/06 was normal( Dr. Carlean Purl), falls into normal risk screening( next colonoscopy due in 2018)  . Hypertension   . Hypothyroidism   . Nephrolithiasis 11/2007, 01/2012  . Obesity   . Pap smear for cervical cancer screening    12/14/2005: normal, evidence of candida. 05/16/2007: normal, benign reperative changes.  . Pneumonia    as a child  . Sleep apnea   . Status post neck dissection 06/21/2016   partial glossectomy  . Tongue cancer ([place]) 06/08/15 bx   SCC of left lateral tongue     Past Surgical History:    Procedure Laterality Date  . AV FISTULA PLACEMENT Left 05/12/2015   Procedure: ARTERIOVENOUS (AV) FISTULA CREATION;  Surgeon: Serafina Mitchell, MD;  Location: [place];  Service: Vascular;  Laterality: Left;  . AV FISTULA PLACEMENT  05/12/15   left fore arm  . CATARACT EXTRACTION Left   . DILATION AND CURETTAGE OF UTERUS    . FLEXIBLE SIGMOIDOSCOPY N/A 10/23/2015   Procedure: FLEXIBLE SIGMOIDOSCOPY;  Surgeon: Gatha Mayer, MD;  Location: [facility] ENDOSCOPY;  Service: Endoscopy;  Laterality: N/A;  . free flap latissimus dorsi  06/21/2016   with neck dissection surgery  . FREE FLAP RADIAL FOREARM  06/21/2016   with neck dissection surgery  . glossectomy Left 10/06/15   partial gossectomy and neck dissection at [facility].  1/18 neck nodes positive. SCC of tongue  . IR GENERIC HISTORICAL  07/14/2016   IR GASTROSTOMY TUBE MOD SED 07/14/2016 Tami Mckusick, DO [facility]-INTERV RAD  . IR GENERIC HISTORICAL Left 07/28/2016   IR DIALY SHUNT INTRO NEEDLE/INTRACATH INITIAL W/IMG LEFT 07/28/2016 Aletta Edouard, MD [facility]-INTERV [facility]  . LITHOTRIPSY  11/2007  . NECK DISSECTION  06/21/2016   partial glossectomy  . SKIN GRAFT SPLIT THICKNESS LEG / FOOT  06/21/2016   with neck dissection surgery  . tracheotomy  06/21/2016   with neck dissection surgery    There were no vitals filed for this  visit.       Subjective Assessment - 08/30/16 1610    Subjective When discharged from [facility], PT, OT, and SLP came to the house, but are no longer doing so.  Nurse still comes to the house to change bandages in several places, to check BP, blood sugar, and temperature. She also checks on her eating; she has gotten new nutrition from Ernestene Kiel at [facility] to try.   Pertinent History Tami Elliott is a 66 y.o. female who  presented with a neck tenderness (left) and left sided tongue/ear soreness. She was given ABX for this and magic mouthwash by her PCP.  Subsequently, the patient saw Dr. Benjamine Mola who  performed biopsy.   Biopsy of Left lateral tongue on 06-08-15 revealed: Invasive ulcerated keratinzing squamous cell carcinoma.  Patient saw Dr Alvy Bimler and has many combordities including renal failure which make her a poor candidate for systemic therapy.  She underwent a tongue resection 10/06/15 with clear margins; 21 lymph nodes were removed, one positive.  She has a recurrence and had second resection 06/21/16.  She had a vein removed from right arm and a skin graft from her right leg to reconstruct the tongue.  Diabetes, HTN, renal failure, hypothyroid.  Never smoker.   Patient Stated Goals would like to build up her energy   Currently in Pain? Yes   Pain Score 6    Pain Location Neck   Pain Orientation Left;Anterior   Pain Descriptors / Indicators Constant;Aching   Aggravating Factors  none   Pain Relieving Factors change sitting positions            [facility] PT Assessment - 08/30/16 0001      Assessment   Medical Diagnosis squamous cell carcinoma of tongue, s/p resection, with recurrence and second resection   Referring Provider Dr. Eppie Gibson   Onset Date/Surgical Date 06/21/16   Hand Dominance Right   Prior Therapy had HHPT, HHOT, and HHSLP but not currently     Precautions   Precautions Fall     Restrictions   Weight Bearing Restrictions No     Balance Screen   Has the patient fallen in the past 6 months No   Has the patient had a decrease in activity level because of a fear of falling?  No  on dialysis, she gets dizzy and falls   Is the patient reluctant to leave their home because of a fear of falling?  No     Home Environment   Living Environment Private residence   Living Arrangements Spouse/significant other   Type of [place] to enter   Entrance Stairs-Number of Steps 2   [place] One level     Prior Function   Level of [place] Retired   Leisure word puzzles and reading;     Observation/Other  Assessments   Observations woman sitting in wheelchair; looks pale, left side of neck and lower cheek appear quite swollen   Skin Integrity has incision that is healed all across from left to right anterior neck; has a trach scar as well (had been on a ventilator); swelling starts just superior to incision     ROM / Strength   AROM / PROM / Strength AROM;Strength     AROM   AROM Assessment Site Cervical   Cervical Flexion 15% loss   Cervical Extension 50% loss    Cervical - Right Side [facility]  Cervical - Left Side Bend WFL   Cervical - Right Rotation 50% loss   Cervical - Left Rotation 50% loss     Strength   Overall Strength Comments not tested today, but pt. reports being weak     Palpation   Palpation comment left neck area of swelling is very firm to palpation     Transfers   Five time sit to stand comments  Patient doesn't think she can stand without using her hands to push off.   Comments She was able to stand independently at her wheelchair and take several small steps to turn and sit down on the mat, and then do the reverse, all independently.     Ambulation/Gait   Ambulation/Gait Yes   Ambulation/Gait Assistance 6: Modified independent (Device/Increase time)  by her report   Assistive device Other (Comment);Rolling walker  wheelchair, bedside commode   Gait Comments uses walker in the house, wheechair outside; she did not bring her walker today, but did not want to try taking steps with another assistive device because she said she gets woozy, and that she was feeling woozy at that time           LYMPHEDEMA/ONCOLOGY QUESTIONNAIRE - 08/30/16 1636      Type   Cancer Type tongue squamous cell     Surgeries   Other Surgery Date 10/06/15  and 06/21/16   Number Lymph Nodes Removed 21  10/06/15     Treatment   Active Radiation Treatment No  will start week of 09/05/16     Lymphedema Assessments   Lymphedema Assessments Head and Neck     Head and Neck   Right  Corner of mouth to where ear lobe meets face 11 cm   Left Corner of mouth to where ear lobe meets face 11.7 cm   4 cm superior to sternal notch around neck 45 cm   6 cm superior to sternal notch around neck 47 cm   8 cm superior to sternal notch around neck 49.7 cm  over left neck swelling   Other two photos were also taken and are in the chart to illustrate swelling                           Short Term Clinic Goals - 08/30/16 1721      CC Short Term Goal  #1   Title Patient will be independent in beginning HEP for increasing strength and endurance.   Time 4   Period Weeks   Status New     CC Short Term Goal  #2   Title Patient and/or her husband will be knowledgeable about manual lymph drainage   Time 4   Period Weeks   Status New     CC Short Term Goal  #3   Title Patient will be knowledgeable about use of compression for neck swelling   Time 4   Period Weeks   Status [facility] [facility] Goals - 08/30/16 1722      CC Long Term Goal  #1   Title Independent in more advanced HEP for strengthening and endurance   Time 8   Period Weeks   Status New     CC Long Term Goal  #2   Title Berg balance score improved by at least 8 points (if needed after initial assessment, which seems likely) for improved safety  Time 8   Period Weeks   Status New     CC Long Term Goal  #3   Title neck active rotation limited by not more than 25%   Time 8   Period Weeks   Status New     CC Long Term Goal  #4   Title neck circumference measurement decreased by at least 3 cm. at 8 cm. superior to sternal notch   Baseline 49.7 cm. at eval on 08/30/16   Time 8   Period Weeks   Status New            Plan - 08/30/16 1711    Clinical Impression Statement Patient is a 66 year-old woman s/p two resections for tongue cancer, the second after a recurrence, and involving graft reconstruction.  She had a radical neck node dissection; incisions runs  across the neck left to right and she has swelling just superior to this that is significant and very firm to the touch.  She has limited neck ROM.  Her mobility is impaired; she is in a wheelchair today but reports walking in the home with a front-wheeled walker. She reports weakness.  She has multiple comorbidities including renal failure, HTN, DM; her status is unstable with cancer that recurred, swelling that may be progressive, and plan for her to start XRT soon, so eval is of high complexity.   Rehab Potential Fair   Clinical Impairments Affecting Rehab Potential beginning XRT the week of 09/05/16   PT Frequency 2x / week   PT Duration 8 weeks   PT Treatment/Interventions ADLs/Self Care Home Management;DME Instruction;Gait training;Stair training;Functional mobility training;Therapeutic activities;Therapeutic exercise;Balance training;Neuromuscular re-education;Patient/family education;Manual techniques;Manual lymph drainage;Compression bandaging;Scar mobilization;Passive range of motion   PT Next Visit Plan Check gait; do Berg balance test; consider doing partial manual muscle test; and/or begin HEP for building strength and endurance.  See what Dr. Isidore Moos wishes re: treating lymphedema sooner vs. later; fashion foam chip pack for neck if appropriate timing.   Recommended Other Services In basket sent to Dr. Isidore Moos to ask about timing of treating neck swelling.   Consulted and Agree with Plan of Care Patient      Patient will benefit from skilled therapeutic intervention in order to improve the following deficits and impairments:  Decreased balance, Decreased endurance, Decreased mobility, Decreased range of motion, Decreased scar mobility, Dizziness, Increased edema, Impaired perceived functional ability  Visit Diagnosis: Lymphedema, not elsewhere classified - Plan: PT plan of care cert/re-cert  Other symptoms and signs involving the musculoskeletal system - Plan: PT plan of care  cert/re-cert  Unsteadiness on feet - Plan: PT plan of care cert/re-cert  Muscle weakness (generalized) - Plan: PT plan of care cert/re-cert  Aftercare following surgery for neoplasm - Plan: PT plan of care cert/re-cert      G-Codes - [id number]    Functional Assessment Tool Used clinical judgement   Functional Limitation Mobility: Walking and moving around   Mobility: Walking and Moving Around Current Status [phone]) At least 60 percent but less than 80 percent impaired, limited or restricted   Mobility: Walking and Moving Around Goal Status [phone]) At least 1 percent but less than 20 percent impaired, limited or restricted       Problem List Patient Active Problem List   Diagnosis Date Noted  . Squamous cell carcinoma of lateral tongue ([place]) 12/04/2015  . Anorectal ulcer 10/23/2015  . Hemorrhoid   . GI bleeding 10/21/2015  . Acute GI bleeding 10/21/2015  .  Acute blood loss anemia 10/21/2015  . Acute hypoxemic respiratory failure ([place]) 08/04/2015  . Hyperkalemia   . Respiratory acidosis   . Acute on chronic renal failure ([place])   . Acute pulmonary edema ([facility])   . ESRD (end stage renal disease) ([place])   . Tongue cancer ([place]) 06/16/2015  . Acute renal failure ([place]) 01/17/2012  . Hydronephrosis 01/17/2012  . Nephrolithiasis 01/17/2012  . BLISTER, FOOT 06/25/2009  . Type 2 diabetes mellitus with diabetic neuropathy, with long-term current use of insulin ([place]) 12/17/2008  . Chronic kidney disease, stage IV (severe) ([place]) 12/17/2008  . ALLERGIC RHINITIS, SEASONAL 12/15/2008  . OBESITY 04/07/2008  . DEPRESSIVE DISORDER 04/07/2008  . NEPHROLITHIASIS 11/02/2007  . UNSPECIFIED DENTAL CARIES 09/05/2007  . HEARING LOSS, TINNITUS 05/16/2007  . BACK PAIN 05/01/2007  . HYPERLIPIDEMIA 10/26/2006  . PSORIASIS 10/26/2006  . Hypothyroidism 06/16/2006  . HYPERTENSION 06/16/2006    Michel Hendon 08/30/2016, 5:31 PM  [place], [place], [address] Phone: [phone]   Fax:  [phone]  Name: Tami Elliott MRN: [id number] Date of Birth: [birth year]  Serafina Royals, PT 08/30/16 5:39 PM

## 2016-08-31 DIAGNOSIS — C021 Malignant neoplasm of border of tongue: Secondary | ICD-10-CM | POA: Diagnosis present

## 2016-08-31 DIAGNOSIS — Z992 Dependence on renal dialysis: Secondary | ICD-10-CM | POA: Diagnosis not present

## 2016-08-31 DIAGNOSIS — Z79899 Other long term (current) drug therapy: Secondary | ICD-10-CM | POA: Diagnosis not present

## 2016-08-31 DIAGNOSIS — N186 End stage renal disease: Secondary | ICD-10-CM | POA: Diagnosis not present

## 2016-08-31 DIAGNOSIS — Z510 Encounter for antineoplastic radiation therapy: Secondary | ICD-10-CM | POA: Diagnosis not present

## 2016-08-31 DIAGNOSIS — I120 Hypertensive chronic kidney disease with stage 5 chronic kidney disease or end stage renal disease: Secondary | ICD-10-CM | POA: Diagnosis not present

## 2016-08-31 DIAGNOSIS — E1122 Type 2 diabetes mellitus with diabetic chronic kidney disease: Secondary | ICD-10-CM | POA: Diagnosis not present

## 2016-08-31 DIAGNOSIS — Z794 Long term (current) use of insulin: Secondary | ICD-10-CM | POA: Diagnosis not present

## 2016-09-01 ENCOUNTER — Telehealth: Payer: Self-pay | Admitting: *Deleted

## 2016-09-01 ENCOUNTER — Ambulatory Visit: Admission: RE | Admit: 2016-09-01 | Payer: Medicare Other | Source: Ambulatory Visit

## 2016-09-01 ENCOUNTER — Ambulatory Visit: Payer: Medicare Other

## 2016-09-01 ENCOUNTER — Ambulatory Visit: Payer: Medicare Other | Admitting: Radiation Oncology

## 2016-09-01 NOTE — Telephone Encounter (Addendum)
Oncology Nurse Navigator Documentation  Called/spoke with Ms. Black's husband re: today's appts.  He indicated his truck is at bottom of their steep, snow/ice-covered driveway, they cannot keep today's appts, unlikely will be able to come tomorrow.  He understands today's lab and post-sim ed will be rescheduled around Monday's 12:00 RT appt.   LINAC 3 and Dr. Isidore Moos notified.  Gayleen Orem, RN, BSN, [place] Neck Oncology Nurse [place] at [place] [phone]

## 2016-09-01 NOTE — Telephone Encounter (Signed)
Oncology Nurse Navigator Documentation  Spoke with Mr. Nachbar, confirmed:  Rescheduled appts for Monday:  11:30 lab, 12:00 RT, 12:25 WUT, 12:45 post-Sim ed.  Her attendance at Tuesday morning's H&N MDC to see SLP, PT, Nutrition and SW.  I provided a 0930 arrival to Radiation Waiting following registration.  Gayleen Orem, RN, BSN, [place] Neck Oncology Nurse [place] at [place] [phone]

## 2016-09-02 ENCOUNTER — Telehealth: Payer: Self-pay | Admitting: *Deleted

## 2016-09-02 ENCOUNTER — Ambulatory Visit: Payer: Medicare Other

## 2016-09-02 NOTE — Telephone Encounter (Signed)
Oncology Nurse Navigator Documentation  Per Dr. Pearlie Oyster guidance, called Rx ([facility], [place]), spoke with pharmacist Shirlee Limerick, provided verbal order:  Promethazine 25 mg tab, take 1 tab q 6hrs, PRN nausea. Disp #60, 3 refills.  She provided read-back.  Called Ms. Erney's husband, notified Rx called in.  Gayleen Orem, RN, BSN, [place] at [place] [phone]

## 2016-09-02 NOTE — Telephone Encounter (Signed)
Oncology Nurse Navigator Documentation  Received call from Ms. Rickels.   She has been experiencing nausea with recent tube feedings and on occasion "when I get up too fast"; denies emesis, fever. She has taken promethazine 25 mg in the past which has resolved symptom but she no longer has this medication. She asks for a refill of promethazine or an alternative. Dr. Isidore Moos notified.  Gayleen Orem, RN, BSN, [place] Neck Oncology Nurse [place] at [place] [phone]

## 2016-09-05 ENCOUNTER — Ambulatory Visit: Payer: Medicare Other | Admitting: Physical Therapy

## 2016-09-05 ENCOUNTER — Encounter: Payer: Self-pay | Admitting: *Deleted

## 2016-09-05 ENCOUNTER — Ambulatory Visit
Admission: RE | Admit: 2016-09-05 | Discharge: 2016-09-05 | Disposition: A | Discharge status: Home / Self Care | Payer: Medicare Other | Source: Ambulatory Visit | Attending: Radiation Oncology | Admitting: Radiation Oncology

## 2016-09-05 ENCOUNTER — Encounter: Payer: Self-pay | Admitting: Radiation Oncology

## 2016-09-05 ENCOUNTER — Ambulatory Visit: Payer: Medicare Other

## 2016-09-05 VITALS — BP 85/65 | HR 70 | Temp 97.8°F | Ht 63.5 in | Wt 195.4 lb

## 2016-09-05 DIAGNOSIS — C021 Malignant neoplasm of border of tongue: Secondary | ICD-10-CM

## 2016-09-05 DIAGNOSIS — Z510 Encounter for antineoplastic radiation therapy: Secondary | ICD-10-CM | POA: Diagnosis not present

## 2016-09-05 DIAGNOSIS — I959 Hypotension, unspecified: Secondary | ICD-10-CM | POA: Insufficient documentation

## 2016-09-05 MED ORDER — BIAFINE EX EMUL
CUTANEOUS | Status: DC | PRN
  Administered 2016-09-05: 13:00:00 via TOPICAL

## 2016-09-05 NOTE — Progress Notes (Signed)
Ms. Tami Elliott presents for her first fraction of radiation to her Left Neck. She reports pain in her Left Ear. She is taking 5 mg of oxycodone every 4 hours with some relief. She is receiving 6 cans of osmolite daily and about 720 cc's of water daily through her feeding tube. She is not taking anything orally at this time. Her blood pressure was low today at 85/63. She was checked multiple times at different sites including her Right Arm and Right Leg. She informs me that her systolic blood pressure today was 135 at home (they do not remember the diastolic). She was provided education today and biafine cream to begin using. She has no other concerns at this time.   BP (!) 85/65   Pulse 70   Temp 97.8 F (36.6 C) (Oral)   Ht 5' 3.5" (1.613 m)   Wt 195 lb 6.4 oz (88.6 kg)   SpO2 100%   BMI 34.07 kg/m    Wt Readings from Last 3 Encounters:  09/05/16 195 lb 6.4 oz (88.6 kg)  08/24/16 190 lb 12.8 oz (86.5 kg)  12/31/15 229 lb (103.9 kg)

## 2016-09-05 NOTE — Progress Notes (Signed)
Pt here for patient teaching.  Pt given Radiation and You booklet, Managing Acute Radiation Side Effects for Head and Neck Cancer handout, skin care instructions and Sonafine. Pt reports they have not watched the Radiation Therapy Education video, but were given the link to watch at home.  Reviewed areas of pertinence such as fatigue, mouth changes, skin changes, throat changes and taste changes . Pt able to give teach back of to pat skin and drink plenty of water,apply Sonafine bid, avoid applying anything to skin within 4 hours of treatment and avoid wearing an under wire bra. Pt verbalizes understanding of information given and will contact nursing with any questions or concerns.     Http://rtanswers.org/treatmentinformation/whattoexpect/index  Managing Acute Radiation Side Effects for Head and Neck Cancer  Skin irritation:  . Sonafine  Topical Emulsion: First-line topical cream to help soothe skin irritation.  Apply to skin in radiation fields at least 4 hours before radiotherapy, or any time after treatments during the rest of the day.  . Triple Antibiotic Ointment (Neosporin): Apply to areas of skin with moist breakdown to prevent infection.  . 1% hydrocortisone cream: Apply to areas of skin that are itching, up to three times a day.  Arnetha Massy (Silver Sulfadiazine): Used in select cases if large patches of skin develop moist breakdown (let physician or nurse know if you have a "sulfa" drug allergy)  Soreness in mouth or throat: . Baking Soda Rinse: a home remedy to soothe/cleanse mouth and loosen thick saliva.  Mix 1/2 teaspoon salt, 1/2 teaspoon baking soda, 1 pint water (16 oz or two cups).  Swish, gargle and spit as needed to soothe/cleanse mouth. Use as often as you want.  . Sucralfate (Carafate): coats throat to soothe it before meals or any time of day. Crush 1 tablet in 10 mL H20 and swallow up to four times a day.  . 2% viscous Lidocaine (Magic Mouth Wash): Soothes mouth  and/or throat by numbing your mucous membranes. Mix 1 part 2% viscous lidocaine (Magic Mouth Wash), 1 part H20. Swish and/or swallow 12mL of this mixture, 39min before meals and at bedtime, up to four times a day. Alternate with Sucralfate (Carafate).  . Narcotics: Various short acting and long acting narcotics can be prescribed.  Often, medical oncology will prescribe these if you are receiving chemotherapy concurrently. Narcotics may cause constipation. It may be helpful to take a stool softener (Docusate Sodium) or gentle laxative (ie Senna or Polyethylene Glycol) to prevent constipation.  Having food in your stomach before ingesting a narcotic may reduce risk of stomach upset.  Thick Saliva: . Baking Soda Rinse: a home remedy to soothe/cleanse mouth and loosen thick saliva.  Mix 1/2 teaspoon salt, 1/2 teaspoon baking soda, 1 pint water (16 oz or two cups).  Swish, gargle, and spit as needed to soothe/cleanse mouth. Use as often as you want.  . Some patients find Diet Ginger Ale or Papaya Juice to be helpful.  . In extreme cases, your physician may consider prescribing a Scopolamine transdermal patch which dries up your saliva.     Poor taste, or lack of taste:   . There are no well-established medications to combat taste bud changes from radiotherapy.  It often takes weeks to months to regain taste function.  Eating bland foods and drinking nutritional shakes  may help you maintain your weight when food is not enjoyable.  Some patients supplement their oral intake with a feeding tube.  Fatigue and weakness: . There is not  a well-established safe and effective medication to combat radiation-induced fatigue.  However, if you are able to perform light exercise (such as a daily walk, yoga, recumbent stationary bicycling), this may combat fatigue and help you maintain muscle mass during treatment.  . Maintaining hydration and nutrition are also important.  If you have not been referred to a  nutritionist and would like a referral, please let your nurse or physician know.  . Try to get at least 8 hours of sleep each night. You may need a daily nap, but try not to nap so late that it interferes with your nightly sleep schedule.

## 2016-09-05 NOTE — Progress Notes (Addendum)
   Weekly Management Note:  Outpatient    ICD-9-CM ICD-10-CM   1. 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  CBCT/MVCT images/Port film x-rays were reviewed.  The chart was checked.  She has pain in left neck, ear, and left facial swelling. Pain addressed w/ oxycodone.   Physical Findings:  Wt Readings from Last 3 Encounters:  09/05/16 195 lb 6.4 oz (88.6 kg)  08/24/16 190 lb 12.8 oz (86.5 kg)  12/31/15 229 lb (103.9 kg)    height is 5' 3.5" (1.613 m) and weight is 195 lb 6.4 oz (88.6 kg). Her oral temperature is 97.8 F (36.6 C). Her blood pressure is 85/65 (abnormal) and her pulse is 70. Her oxygen saturation is 100%.  facial swelling, neck swelling, on left.  Oral cavity without thrush.  Punctate area of serous/ pustular drainage from neck scar on left  CBC    Component Value Date/Time   WBC 10.7 (H) 08/01/2016 0826   RBC 2.62 (L) 08/01/2016 0826   HGB 7.9 (L) 08/01/2016 0826   HCT 23.2 (L) 08/01/2016 0826   PLT 380 08/01/2016 0826   MCV 88.5 08/01/2016 0826   MCH 30.2 08/01/2016 0826   MCHC 34.1 08/01/2016 0826   RDW 16.4 (H) 08/01/2016 0826   LYMPHSABS 2.2 07/22/2016 0547   MONOABS 0.8 07/22/2016 0547   EOSABS 0.5 07/22/2016 0547   BASOSABS 0.0 07/22/2016 0547     CMP     Component Value Date/Time   NA 137 08/01/2016 0826   K 3.9 08/01/2016 0826   CL 97 (L) 08/01/2016 0826   CO2 26 08/01/2016 0826   GLUCOSE 133 (H) 08/01/2016 0826   BUN 29 (H) 08/01/2016 0826   CREATININE 2.24 (H) 08/01/2016 0826   CALCIUM 9.6 08/01/2016 0826   CALCIUM 9.0 07/13/2016 1030   PROT 6.9 07/09/2016 0624   ALBUMIN 3.2 (L) 08/01/2016 0826   AST 26 07/09/2016 0624   ALT 16 07/09/2016 0624   ALKPHOS 105 07/09/2016 0624   BILITOT 0.5 07/09/2016 0624   GFRNONAA 22 (L) 08/01/2016 0826   GFRAA 25 (L) 08/01/2016 [id number]     Impression:   The patient is tolerating radiotherapy.   Plan:  Continue radiotherapy as planned. Gayleen Orem, RN, our Head and Neck Oncology Navigator will dress neck scar and give additional antibiotic ointment today Pt is hypotensive but not dehydrated by history or exam.  Advised to stand with caution. -----------------------------------  Eppie Gibson, MD

## 2016-09-06 ENCOUNTER — Ambulatory Visit: Payer: Medicare Other | Admitting: Physical Therapy

## 2016-09-06 ENCOUNTER — Ambulatory Visit
Admission: RE | Admit: 2016-09-06 | Discharge: 2016-09-06 | Disposition: A | Discharge status: Home / Self Care | Payer: Medicare Other | Source: Ambulatory Visit | Attending: Radiation Oncology | Admitting: Radiation Oncology

## 2016-09-06 ENCOUNTER — Other Ambulatory Visit: Payer: Self-pay | Admitting: Radiation Oncology

## 2016-09-06 ENCOUNTER — Ambulatory Visit: Payer: Medicare Other

## 2016-09-06 ENCOUNTER — Encounter: Payer: Self-pay | Admitting: *Deleted

## 2016-09-06 ENCOUNTER — Ambulatory Visit: Payer: Medicare Other | Admitting: Nutrition

## 2016-09-06 VITALS — BP 125/47 | HR 81 | Temp 98.4°F

## 2016-09-06 DIAGNOSIS — C029 Malignant neoplasm of tongue, unspecified: Secondary | ICD-10-CM

## 2016-09-06 DIAGNOSIS — C021 Malignant neoplasm of border of tongue: Secondary | ICD-10-CM

## 2016-09-06 DIAGNOSIS — Z510 Encounter for antineoplastic radiation therapy: Secondary | ICD-10-CM | POA: Diagnosis not present

## 2016-09-06 DIAGNOSIS — I890 Lymphedema, not elsewhere classified: Secondary | ICD-10-CM | POA: Diagnosis not present

## 2016-09-06 DIAGNOSIS — R1310 Dysphagia, unspecified: Secondary | ICD-10-CM

## 2016-09-06 DIAGNOSIS — R29898 Other symptoms and signs involving the musculoskeletal system: Secondary | ICD-10-CM

## 2016-09-06 MED ORDER — OXYCODONE HCL 10 MG PO TABS: ORAL_TABLET | ORAL | 0 refills | Status: DC

## 2016-09-06 NOTE — Therapy (Addendum)
[place], [place], [address] Phone: [phone]   Fax:  [phone]  Physical Therapy Treatment  Patient Details  Name: Tami Elliott MRN: [id number] Date of Birth: 1950/12/10 Referring Provider: Dr. Eppie Gibson  Encounter Date: 09/06/2016      PT End of Session - 09/06/16 1103    Visit Number 2   Number of Visits 9   Date for PT Re-Evaluation 10/28/16   PT Start Time 1000   PT Stop Time 9379   PT Time Calculation (min) 39 min   Activity Tolerance Patient tolerated treatment well;No increased pain   Behavior During Therapy WFL for tasks assessed/performed      Past Medical History:  Diagnosis Date  . Acute pulmonary edema ([place]) 08/03/2015  . Anemia   . Anorectal ulcer 10/23/2015   and thrombosed internal hemorrhoids. with hematochezia  . Arthritis   . Back pain    myofascial, excacerberated in 6-7/09, 9/09- required narcotics at those times  . Cataract    Left eye( Dr. Katy Fitch, patient uninsured so she can't  get  an intervention)  . Depression    better on nortriptyline  . Diabetes mellitus   . Elbow fracture, right   . ESRD (end stage renal disease) ([place]) 09/2015   started HD in  09/2015. nephrolithiasis, s/p removal 12/05/07  . GERD (gastroesophageal reflux disease)   . H/O skin graft 06/21/2016   splint thickness skin graft leg  . Hyperplastic colon polyp 2001   f/u colonoscopy 11/23/06 was normal( Dr. Carlean Purl), falls into normal risk screening( next colonoscopy due in 2018)  . Hypertension   . Hypothyroidism   . Nephrolithiasis 11/2007, 01/2012  . Obesity   . Pap smear for cervical cancer screening    12/14/2005: normal, evidence of candida. 05/16/2007: normal, benign reperative changes.  . Pneumonia    as a child  . Sleep apnea   . Status post neck dissection 06/21/2016   partial glossectomy  . Tongue cancer ([place]) 06/08/15 bx   SCC of left lateral tongue     Past Surgical  History:  Procedure Laterality Date  . AV FISTULA PLACEMENT Left 05/12/2015   Procedure: ARTERIOVENOUS (AV) FISTULA CREATION;  Surgeon: Serafina Mitchell, MD;  Location: [facility];  Service: Vascular;  Laterality: Left;  . AV FISTULA PLACEMENT  05/12/15   left fore arm  . CATARACT EXTRACTION Left   . DILATION AND CURETTAGE OF UTERUS    . FLEXIBLE SIGMOIDOSCOPY N/A 10/23/2015   Procedure: FLEXIBLE SIGMOIDOSCOPY;  Surgeon: Gatha Mayer, MD;  Location: [facility] END[facility]COPY;  Service: Endoscopy;  Laterality: N/A;  . free flap latissimus dorsi  06/21/2016   with neck dissection surgery  . FREE FLAP RADIAL FOREARM  06/21/2016   with neck dissection surgery  . glossectomy Left 10/06/15   partial gossectomy and neck dissection at [facility].  1/18 neck nodes positive. SCC of tongue  . IR GENERIC HISTORICAL  07/14/2016   IR GASTROSTOMY TUBE MOD SED 07/14/2016 Corrie Mckusick, DO [facility]-INTERV RAD  . IR GENERIC HISTORICAL Left 07/28/2016   IR DIALY SHUNT INTRO NEEDLE/INTRACATH INITIAL W/IMG LEFT 07/28/2016 Aletta Edouard, MD [facility]INTER[facility]  . LITHOTRIPSY  11/2007  . NECK DISSECTION  06/21/2016   partial glossectomy  . SKIN GRAFT SPLIT THICKNESS LEG / FOOT  06/21/2016   with neck dissection surgery  . tracheotomy  06/21/2016   with neck dissection surgery    There were no vitals filed for this visit.  Subjective Assessment - 09/06/16 1001    Subjective Started radiation yesterday and it burned there.  Had an earache all night.  Had a spot that was oozing but the doctor looked at it and put a bandaid on it.   Currently in Pain? Yes   Pain Score 7    Pain Location Face   Pain Orientation Left   Pain Descriptors / Indicators Aching;Burning;Constant;Dull   Pain Onset Yesterday   Aggravating Factors  radiation treatment   Pain Relieving Factors nothing            [facility] PT Assessment - 09/06/16 0001      Precautions   Precaution Comments Dr. Elspeth Cho starting treatment for swelling without  delay.     Observation/Other Assessments   Skin Integrity Patient with small area oozing small amount of fluid at left cheek, covered with a bandaid today.                     [facility] Adult PT Treatment/Exercise - 09/06/16 0001      Self-Care   Self-Care Other Self-Care Comments   Other Self-Care Comments  Made and gave patient a foam chip pack in stockinette, putting it on her and instructing her and her husband about its use.  Began instructing patient and her husband about the principles and technique of manual lymph drainage.     Manual Therapy   Manual Therapy Manual Lymphatic Drainage (MLD)   Manual Lymphatic Drainage (MLD) With patient sitting in wheelchair:  five diaphragmatic breaths; supraclavicular fossae, bilateral axillae, bilateral shoulder collectors; posterolateral neck, established pathway going up over ears; cheeks, chin, submental and submandibular nodes, then reinforced pathway up face, over ears, and down to posterolateral neck; also did anterolateral neck; then reinforced pathways down to axillae.  Avoid left face area of small oozing wound (under bandaid) today.  Pt.'s husband was attentive throughout                PT Education - 09/06/16 1102    Education provided Yes   Education Details use foam chip pack placed snugly but comfortable over swelling for 2-4 hours/day if possible, and look for benefits when it is removed; begin instruction in manual lymph drainage   Person(s) Educated Patient;Spouse   Methods Explanation;Demonstration;Verbal cues   Comprehension Verbalized understanding           Short Term Clinic Goals - 08/30/16 1721      CC Short Term Goal  #1   Title Patient will be independent in beginning HEP for increasing strength and endurance.   Time 4   Period Weeks   Status New     CC Short Term Goal  #2   Title Patient and/or her husband will be knowledgeable about manual lymph drainage   Time 4   Period Weeks   Status  New     CC Short Term Goal  #3   Title Patient will be knowledgeable about use of compression for neck swelling   Time 4   Period Weeks   Status [facility] Goals - 08/30/16 1722      CC Long Term Goal  #1   Title Independent in more advanced HEP for strengthening and endurance   Time 8   Period Weeks   Status New     CC Long Term Goal  #2   Title Berg balance score improved by  at least 8 points (if needed after initial assessment, which seems likely) for improved safety   Time 8   Period Weeks   Status New     CC Long Term Goal  #3   Title neck active rotation limited by not more than 25%   Time 8   Period Weeks   Status New     CC Long Term Goal  #4   Title neck circumference measurement decreased by at least 3 cm. at 8 cm. superior to sternal notch   Baseline 49.7 cm. at eval on 08/30/16   Time [place] - 09/06/16 1103    Clinical Impression Statement Patient complained several times during our session about her head hurting today; session was done at the [facility], and nurse navigator was able to get her a new pain medicine prescription this morning.  She was able to tolerate her first treatment session without increase in pain.  Her husband was attentive throughout.    Rehab Potential Fair   Clinical Impairments Affecting Rehab Potential began XRT 09/05/16   PT Frequency 2x / week   PT Duration 8 weeks   PT Treatment/Interventions ADLs/Self Care Home Management;DME Instruction;Gait training;Stair training;Functional mobility training;Therapeutic activities;Therapeutic exercise;Balance training;Neuromuscular re-education;Patient/family education;Manual techniques;Manual lymph drainage;Compression bandaging;Scar mobilization;Passive range of motion   PT Next Visit Plan Check in about use of foam chip pack; teach husband manual lymph drainage and give handout for same.  Check gait; do Berg balance  test; consider doing partial manual muscle test; and/or begin HEP for building strength and endurance.     Recommended Other Services Dr. Elspeth Cho starting treatment for swelling right away.   Consulted and Agree with Plan of Care Patient      Patient will benefit from skilled therapeutic intervention in order to improve the following deficits and impairments:  Decreased balance, Decreased endurance, Decreased mobility, Decreased range of motion, Decreased scar mobility, Dizziness, Increased edema, Impaired perceived functional ability  Visit Diagnosis: Lymphedema, not elsewhere classified  Other symptoms and signs involving the musculoskeletal system     Problem List Patient Active Problem List   Diagnosis Date Noted  . Squamous cell carcinoma of lateral tongue ([place]) 12/04/2015  . Anorectal ulcer 10/23/2015  . Hemorrhoid   . GI bleeding 10/21/2015  . Acute GI bleeding 10/21/2015  . Acute blood loss anemia 10/21/2015  . Acute hypoxemic respiratory failure ([place]) 08/04/2015  . Hyperkalemia   . Respiratory acidosis   . Acute on chronic renal failure ([place])   . Acute pulmonary edema (HCC)   . ESRD (end stage renal disease) ([place])   . Tongue cancer ([place]) 06/16/2015  . Acute renal failure ([place]) 01/17/2012  . Hydronephrosis 01/17/2012  . Nephrolithiasis 01/17/2012  . BLISTER, FOOT 06/25/2009  . Type 2 diabetes mellitus with diabetic neuropathy, with long-term current use of insulin ([place]) 12/17/2008  . Chronic kidney disease, stage IV (severe) ([place]) 12/17/2008  . ALLERGIC RHINITIS, SEASONAL 12/15/2008  . OBESITY 04/07/2008  . DEPRESSIVE DISORDER 04/07/2008  . NEPHROLITHIASIS 11/02/2007  . UNSPECIFIED DENTAL CARIES 09/05/2007  . HEARING LOSS, TINNITUS 05/16/2007  . BACK PAIN 05/01/2007  . HYPERLIPIDEMIA 10/26/2006  . PSORIASIS 10/26/2006  . Hypothyroidism 06/16/2006  . HYPERTENSION 06/16/2006    Meryem Haertel 09/06/2016, 11:09 AM  [place], [place], [address] Phone: [phone]   Fax:  [phone]  Name: Tami Elliott MRN: [id number] Date of Birth: [birth year]  Serafina Royals, PT 09/06/16 11:09 AM  PHYSICAL THERAPY DISCHARGE SUMMARY  Visits from Start of Care: 2  Current functional level related to goals / functional outcomes:  Pt. did not return after the visit on 09/06/16. She subsequently passed away.  Remaining deficits: N/A   Education / Equipment: About use of compression for neck swelling.  Plan: Patient agrees to discharge.  Patient goals were not met. Patient is being discharged due to not returning since the last visit.  ?????   Serafina Royals, PT 11/02/17 2:06 PM

## 2016-09-06 NOTE — Progress Notes (Signed)
Nutrition follow-up completed during Head and Neck clinic. Patient is receiving radiation therapy for tongue cancer. Patient's husband reports patient is tolerating Osmolite 1.5 - 4 cans daily without vomiting. He reports he is going to increase to 5 cans today and eventually 6 cans as a goal rate. Patient denies constipation or diarrhea. She reports occasional nausea, but takes nausea medication. No recent labs. Weight was documented at 195.4 pounds on January 22 improved from 190.8 pounds January 10.  Nutrition diagnosis: Unintended weight loss has improved.  Intervention: Educated patient and husband to increase Osmolite 1.5 - 6 cans daily giving one and one half cans 4 times a day with 60 cc free water before and after bolus feedings. Patient should drink or flush feeding tube with an additional 650 cc free water. Tube feeding at goal rate will provide 2130 cal, 89.4 g protein, 1566 mL free water, which is greater than 90%.  Estimated nutrition needs.  Monitoring, evaluation, goals:  Patient will tolerate tube feeding at goal rate to meet greater than 90% of estimated needs.  Next visit: To be scheduled as needed. Patient and husband have my contact information.  **Disclaimer: This note was dictated with voice recognition software. Similar sounding words can inadvertently be transcribed and this note may contain transcription errors which may not have been corrected upon publication of note.**

## 2016-09-06 NOTE — Therapy (Signed)
[facility] [address] [place], [place], [address] Phone: [phone]   Fax:  [phone]  Patient Details  Name: Tami Elliott MRN: [id number] Date of Birth: 23-Jul-1951 Referring Provider:  Eppie Gibson, MD  Encounter Date: 09/06/2016   ST ARRIVED/CANCEL  In consultation with Gayleen Orem, head and neck nurse navigator, and Dory Peru, dietician, it was decided to defer ST eval until after pt undergoes a possible modified barium swallow evaluation. SLP will see pt for ST evaluation at a date soon in the future.  Dr. Isidore Moos was notified of this.   [facility] ,[place], [place]  09/06/2016, 10:55 AM  [facility] [address] [place], [place], [address] Phone: [phone]   Fax:  [phone]

## 2016-09-07 ENCOUNTER — Telehealth: Payer: Self-pay | Admitting: *Deleted

## 2016-09-07 ENCOUNTER — Other Ambulatory Visit (HOSPITAL_COMMUNITY): Payer: Self-pay | Admitting: Radiation Oncology

## 2016-09-07 ENCOUNTER — Ambulatory Visit
Admission: RE | Admit: 2016-09-07 | Discharge: 2016-09-07 | Disposition: A | Discharge status: Home / Self Care | Payer: Medicare Other | Source: Ambulatory Visit | Attending: Radiation Oncology | Admitting: Radiation Oncology

## 2016-09-07 ENCOUNTER — Encounter: Payer: Self-pay | Admitting: *Deleted

## 2016-09-07 ENCOUNTER — Ambulatory Visit: Payer: Self-pay

## 2016-09-07 ENCOUNTER — Ambulatory Visit: Payer: Medicare Other

## 2016-09-07 DIAGNOSIS — R634 Abnormal weight loss: Secondary | ICD-10-CM

## 2016-09-07 DIAGNOSIS — C021 Malignant neoplasm of border of tongue: Secondary | ICD-10-CM

## 2016-09-07 DIAGNOSIS — Z510 Encounter for antineoplastic radiation therapy: Secondary | ICD-10-CM | POA: Diagnosis not present

## 2016-09-07 DIAGNOSIS — R1319 Other dysphagia: Secondary | ICD-10-CM

## 2016-09-07 LAB — TSH: TSH: 2.936 m(IU)/L (ref 0.308–3.960)

## 2016-09-07 NOTE — Progress Notes (Signed)
Head & Neck Multidisciplinary Clinic Clinical Social Work  Clinical Social Work met with patient/family  at head & neck multidisciplinary clinic to offer support and assess for psychosocial needs.  Tami Elliott was accompanied by her spouse.  She shared she is "tired", is unable to enjoy any activities due to physical condition, and experiences distress due to son at home that is not supportive.  The patient shared she receives great support from her spouse and worries that she "sometimes takes it out on him" [referring to feeling depressed].  CSW provided brief support, patient not interested in counseling at this time.  Tami Elliott shared her main concern was financial due to her fixed income and several medical needs.  CSW signed up patient for [employer] and provided with a gift card today.    ONCBCN DISTRESS SCREENING 08/24/2016  Screening Type Initial Screening  Distress experienced in past week (1-10) 10  Practical problem type Food  Family Problem type Children  Emotional problem type Feeling hopeless  Spiritual/Religous concerns type Loss of sense of purpose  Information Concerns Type Lack of info about diagnosis;Lack of info about treatment  Physical Problem type Pain;Sleep/insomnia;Skin dry/itchy  Physician notified of physical symptoms Yes  Referral to clinical social work      Clinical Social Work briefly discussed Clinical Social Work role and [employer] support programs/services.  Clinical Social Work encouraged patient to call with any additional questions or concerns.   Polo Riley, MSW, LCSW, OSW-C Clinical Social Worker [facility] [phone]

## 2016-09-07 NOTE — Telephone Encounter (Signed)
PATIENT TO HAVE MBBS ON 09-12-16 @ 1 PM @ [facility]

## 2016-09-07 NOTE — Progress Notes (Signed)
Oncology Nurse Navigator Documentation  Met with Ms. Tami Elliott during H&N MDC.  She was accompanied by her husband.  Provided verbal and written overview of MDC, the clinicians who will be seeing her, encouraged them to ask questions during her time with them.  She was seen by Nutrition, SLP, PT and SW.  SW provided gift card from [employer].  I obtained from Dr. Squire new oxycodone Rx to address [name]'s self report pain not being controlled with current Rx.   Following MDC, I escorted her to RT for treatment. They understand I can be contacted with needs/concerns.  Rick Diehl, RN, BSN, CHPN Head & Neck Oncology Navigator [facility] at [place] [phone]   

## 2016-09-07 NOTE — Telephone Encounter (Signed)
CALLED [phone] TO ARRANGE MBBS FOR THIS PATIENT, WAITING FOR  A RETURN CALL

## 2016-09-08 ENCOUNTER — Ambulatory Visit
Admission: RE | Admit: 2016-09-08 | Discharge: 2016-09-08 | Disposition: A | Discharge status: Home / Self Care | Payer: Medicare Other | Source: Ambulatory Visit | Attending: Radiation Oncology | Admitting: Radiation Oncology

## 2016-09-08 ENCOUNTER — Ambulatory Visit: Payer: Medicare Other

## 2016-09-08 DIAGNOSIS — Z510 Encounter for antineoplastic radiation therapy: Secondary | ICD-10-CM | POA: Diagnosis not present

## 2016-09-08 NOTE — Progress Notes (Signed)
Oncology Nurse Navigator Documentation  Met with Tami Elliott during first RT.  She was accompanied by her husband. She tolerated tmt without difficulty, denied concerns. I escorted her to [facility] with Dr. Squire.  Rick Diehl, RN, BSN, CHPN Head & Neck Oncology Nurse Navigator [facility] at [place] [phone]  

## 2016-09-09 ENCOUNTER — Ambulatory Visit
Admission: RE | Admit: 2016-09-09 | Discharge: 2016-09-09 | Disposition: A | Discharge status: Home / Self Care | Payer: Medicare Other | Source: Ambulatory Visit | Attending: Radiation Oncology | Admitting: Radiation Oncology

## 2016-09-09 ENCOUNTER — Other Ambulatory Visit: Payer: Self-pay | Admitting: *Deleted

## 2016-09-09 ENCOUNTER — Other Ambulatory Visit: Payer: Self-pay | Admitting: Radiation Oncology

## 2016-09-09 DIAGNOSIS — Z510 Encounter for antineoplastic radiation therapy: Secondary | ICD-10-CM | POA: Diagnosis not present

## 2016-09-09 DIAGNOSIS — E039 Hypothyroidism, unspecified: Secondary | ICD-10-CM

## 2016-09-09 MED ORDER — LEVOTHYROXINE SODIUM 150 MCG PO TABS: 150.0000 ug | ORAL_TABLET | Freq: Every day | ORAL | 0 refills | Status: DC

## 2016-09-12 ENCOUNTER — Encounter: Payer: Self-pay | Admitting: Radiation Oncology

## 2016-09-12 ENCOUNTER — Ambulatory Visit
Admission: RE | Admit: 2016-09-12 | Discharge: 2016-09-12 | Disposition: A | Discharge status: Home / Self Care | Payer: Medicare Other | Source: Ambulatory Visit | Attending: Radiation Oncology | Admitting: Radiation Oncology

## 2016-09-12 ENCOUNTER — Ambulatory Visit (HOSPITAL_COMMUNITY): Payer: Medicare Other

## 2016-09-12 ENCOUNTER — Ambulatory Visit (HOSPITAL_COMMUNITY): Admission: RE | Admit: 2016-09-12 | Payer: Medicare Other | Source: Ambulatory Visit

## 2016-09-12 ENCOUNTER — Ambulatory Visit: Payer: Medicare Other | Admitting: Physical Therapy

## 2016-09-12 VITALS — BP 128/55 | HR 79 | Temp 97.5°F | Ht 63.5 in | Wt 194.0 lb

## 2016-09-12 DIAGNOSIS — Z510 Encounter for antineoplastic radiation therapy: Secondary | ICD-10-CM | POA: Diagnosis not present

## 2016-09-12 DIAGNOSIS — C021 Malignant neoplasm of border of tongue: Secondary | ICD-10-CM

## 2016-09-12 NOTE — Progress Notes (Signed)
Ms. Tami Elliott is here for her 6th fraction of radiation to her Left Neck. She denies pain. She has some fatigue. She is not eating anything by mouth. She had an appointment for a MBS today, but missed it due to confusion about the time it was scheduled. Her mouth has thick saliva present, and she is using mouth swabs often. She also reports a dry mouth. She is receiving 4 cans of osmolite daily. She is receiving about 620 ml of water daily through her tube. Her Left neck is draining a pinkish color drainage and there is a bandaid intact at this time. She is using sonafine twice daily and also antibiotic ointment to this area.  She is having a bowel movement about every other day.   BP (!) 128/55   Pulse 79   Temp 97.5 F (36.4 C)   Ht 5' 3.5" (1.613 m)   Wt 194 lb (88 kg)   SpO2 99% Comment: room air  BMI 33.83 kg/m    Wt Readings from Last 3 Encounters:  09/12/16 194 lb (88 kg)  09/05/16 195 lb 6.4 oz (88.6 kg)  08/24/16 190 lb 12.8 oz (86.5 kg)

## 2016-09-12 NOTE — Progress Notes (Signed)
   Weekly Management Note:  Outpatient    ICD-9-CM ICD-10-CM   1. 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  CBCT/MVCT images/Port film x-rays were reviewed.  The chart was checked.   She missed her MBS appointment today.  She has thick saliva and dry mouth.  Weight is stable.  Increased drainage from opening over left neck.   Physical Findings:  Wt Readings from Last 3 Encounters:  09/12/16 194 lb (88 kg)  09/05/16 195 lb 6.4 oz (88.6 kg)  08/24/16 190 lb 12.8 oz (86.5 kg)    height is 5' 3.5" (1.613 m) and weight is 194 lb (88 kg). Her temperature is 97.5 F (36.4 C). Her blood pressure is 128/55 (abnormal) and her pulse is 79. Her oxygen saturation is 99%.  facial swelling, neck swelling, on left.  Oral cavity with thick white saliva..  Punctate area of serous/ pustular drainage from neck scar on left from last week is now is about 2cm open with milky drainage.  This appears to be tumor emanating from the skin.   CBC    Component Value Date/Time   WBC 10.7 (H) 08/01/2016 0826   RBC 2.62 (L) 08/01/2016 0826   HGB 7.9 (L) 08/01/2016 0826   HCT 23.2 (L) 08/01/2016 0826   PLT 380 08/01/2016 0826   MCV 88.5 08/01/2016 0826   MCH 30.2 08/01/2016 0826   MCHC 34.1 08/01/2016 0826   RDW 16.4 (H) 08/01/2016 0826   LYMPHSABS 2.2 07/22/2016 0547   MONOABS 0.8 07/22/2016 0547   EOSABS 0.5 07/22/2016 0547   BASOSABS 0.0 07/22/2016 0547     CMP     Component Value Date/Time   NA 137 08/01/2016 0826   K 3.9 08/01/2016 0826   CL 97 (L) 08/01/2016 0826   CO2 26 08/01/2016 0826   GLUCOSE 133 (H) 08/01/2016 0826   BUN 29 (H) 08/01/2016 0826   CREATININE 2.24 (H) 08/01/2016 0826   CALCIUM 9.6 08/01/2016 0826   CALCIUM 9.0 07/13/2016 1030   PROT 6.9 07/09/2016 0624   ALBUMIN 3.2 (L) 08/01/2016 0826   AST 26 07/09/2016 0624   ALT 16  07/09/2016 0624   ALKPHOS 105 07/09/2016 0624   BILITOT 0.5 07/09/2016 0624   GFRNONAA 22 (L) 08/01/2016 0826   GFRAA 25 (L) 08/01/2016 [id number]     Impression:  The patient is tolerating radiotherapy.   Plan:  Continue radiotherapy as planned. [facility] RN will dress neck scar and give additional antibiotic ointment today. Afebrile.  Reschedule MBSS.  Advised to use baking soda salt gargles prn. -----------------------------------  Eppie Gibson, MD

## 2016-09-13 ENCOUNTER — Encounter: Payer: Self-pay | Admitting: Nurse Practitioner

## 2016-09-13 ENCOUNTER — Ambulatory Visit (INDEPENDENT_AMBULATORY_CARE_PROVIDER_SITE_OTHER): Payer: Medicare Other | Admitting: Nurse Practitioner

## 2016-09-13 ENCOUNTER — Ambulatory Visit
Admission: RE | Admit: 2016-09-13 | Discharge: 2016-09-13 | Disposition: A | Discharge status: Home / Self Care | Payer: Medicare Other | Source: Ambulatory Visit | Attending: Radiation Oncology | Admitting: Radiation Oncology

## 2016-09-13 VITALS — BP 130/70 | HR 76 | Temp 97.8°F | Ht 64.5 in | Wt 198.0 lb

## 2016-09-13 DIAGNOSIS — C021 Malignant neoplasm of border of tongue: Secondary | ICD-10-CM | POA: Diagnosis not present

## 2016-09-13 DIAGNOSIS — Z794 Long term (current) use of insulin: Secondary | ICD-10-CM

## 2016-09-13 DIAGNOSIS — E1140 Type 2 diabetes mellitus with diabetic neuropathy, unspecified: Secondary | ICD-10-CM

## 2016-09-13 DIAGNOSIS — F418 Other specified anxiety disorders: Secondary | ICD-10-CM

## 2016-09-13 DIAGNOSIS — T753XXA Motion sickness, initial encounter: Secondary | ICD-10-CM | POA: Diagnosis not present

## 2016-09-13 DIAGNOSIS — Z510 Encounter for antineoplastic radiation therapy: Secondary | ICD-10-CM | POA: Diagnosis not present

## 2016-09-13 LAB — POCT GLYCOSYLATED HEMOGLOBIN (HGB A1C): HEMOGLOBIN A1C: 5.8

## 2016-09-13 MED ORDER — PROMETHAZINE HCL 25 MG PO TABS: 25.0000 mg | ORAL_TABLET | Freq: Three times a day (TID) | ORAL | 0 refills | Status: AC | PRN

## 2016-09-13 MED ORDER — MECLIZINE HCL 12.5 MG PO TABS
12.5000 mg | ORAL_TABLET | Freq: Three times a day (TID) | ORAL | 0 refills | Status: AC | PRN
Start: 2016-09-13 — End: ?

## 2016-09-13 NOTE — Progress Notes (Signed)
Subjective:    Patient ID: Tami Elliott, female    DOB: [birth year], 66 y.o.   MRN: [id number]  Patient presents today for establish care (new patient).  Ongoing radiation for Throat cancer. Therapy once a week.  Anxiety  Presents for initial visit. Onset was 1 to 5 years ago. The problem has been waxing and waning. Symptoms include depressed mood, excessive worry, hyperventilation, nausea, nervous/anxious behavior, palpitations and panic. Patient reports no chest pain, confusion, decreased concentration, dizziness, insomnia or suicidal ideas. Symptoms occur constantly. The severity of symptoms is causing significant distress. Exacerbated by: unknown prognosis of tongue cancer and feeding tube. The quality of sleep is fair. Nighttime awakenings: one to two.   Risk factors include a major life event and recent illness. Her past medical history is significant for anxiety/panic attacks and depression. There is no history of suicide attempts. Past treatments include non-SSRI antidepressants. Compliance with prior treatments has been variable.  Diabetes  She presents for her follow-up diabetic visit. She has type 2 diabetes mellitus. Her disease course has been stable. Hypoglycemia symptoms include nervousness/anxiousness. Pertinent negatives for hypoglycemia include no confusion, dizziness, headaches, hunger, mood changes, sleepiness or tremors. Associated symptoms include fatigue and weakness. Pertinent negatives for diabetes include no blurred vision, no chest pain, no foot paresthesias, no foot ulcerations, no polydipsia, no polyphagia, no polyuria, no visual change and no weight loss. There are no hypoglycemic complications. There are no diabetic complications. Risk factors for coronary artery disease include diabetes mellitus, dyslipidemia, obesity and sedentary lifestyle. Current diabetic treatment includes insulin injections. She is compliant with treatment all of the time. Her weight is  stable. She has not had a previous visit with a dietitian. She never participates in exercise. Her home blood glucose trend is fluctuating minimally. Her breakfast blood glucose range is generally 110-130 mg/dl. Her dinner blood glucose range is generally 130-140 mg/dl. An ACE inhibitor/angiotensin II receptor blocker is not being taken. She does not see a podiatrist.Eye exam is current.  Dizziness  This is a recurrent problem. The current episode started more than 1 month ago. The problem occurs intermittently. The problem has been waxing and waning. Associated symptoms include fatigue, nausea, vertigo and weakness. Pertinent negatives include no anorexia, chest pain, chills, congestion, coughing, fever, headaches, sore throat or visual change. Exacerbated by: riding in car. She has tried nothing for the symptoms.     previous pcp: Dr. Jimmye Norman with [place], [place]. Office closed without notification.  Immunizations: (TDAP, Hep C screen, Pneumovax, Influenza, zoster)  Health Maintenance  Topic Date Due  .  Hepatitis C: One time screening is recommended by Center for Disease Control  (CDC) for  adults born from [birth year] through 1964.   1950-10-27  . HIV Screening  08/02/1967  . Pap Smear  08/01/1973  . Eye exam for diabetics  05/15/2009  . Urine Protein Check  04/22/2010  . Complete foot exam   09/04/2010  . Shingles Vaccine  08/01/2012  . Mammogram  01/16/2016  . Hemoglobin A1C  03/13/2017  . Tetanus Vaccine  01/19/2020  . Colon Cancer Screening  01/27/2020  . Flu Shot  Addressed   Diet:liquid supplement through Peg tube Weight:  Wt Readings from Last 3 Encounters:  09/13/16 198 lb (89.8 kg)  09/12/16 194 lb (88 kg)  09/05/16 195 lb 6.4 oz (88.6 kg)   Fall Risk: Fall Risk  09/13/2016 08/24/2016 12/04/2015 06/22/2015  Falls in the past year? No No Yes No  Number falls in  past yr: - - 1 -  Injury with Fall? - - Yes -   Home Safety: home with husband and  grandson Depression/Suicide:needs referral to psychology Depression screen [facility] 2/9 09/13/2016 08/24/2016 12/04/2015  Decreased Interest 3 0 0  Down, Depressed, Hopeless 3 0 0  PHQ - 2 Score 6 0 0  Altered sleeping 0 - -  Tired, decreased energy 3 - -  Feeling bad or failure about yourself  0 - -  Trouble concentrating 3 - -  Moving slowly or fidgety/restless 1 - -  Suicidal thoughts 0 - -  PHQ-9 Score 13 - -   No flowsheet data found. Vision:up to date Advanced Directive: Advanced Directives 09/06/2016  Does Patient Have a Medical Advance Directive? Yes  Would patient like information on creating a medical advance directive? -   Medications and allergies reviewed with patient and updated if appropriate.  Patient Active Problem List   Diagnosis Date Noted  . Squamous cell carcinoma of lateral tongue ([place]) 12/04/2015  . Anorectal ulcer 10/23/2015  . Hemorrhoid   . GI bleeding 10/21/2015  . Acute GI bleeding 10/21/2015  . Acute blood loss anemia 10/21/2015  . Acute hypoxemic respiratory failure ([place]) 08/04/2015  . Hyperkalemia   . Respiratory acidosis   . Acute on chronic renal failure ([place])   . Acute pulmonary edema (HCC)   . ESRD (end stage renal disease) ([place])   . Tongue cancer ([place]) 06/16/2015  . Acute renal failure ([place]) 01/17/2012  . Hydronephrosis 01/17/2012  . Nephrolithiasis 01/17/2012  . BLISTER, FOOT 06/25/2009  . Type 2 diabetes mellitus with diabetic neuropathy, with long-term current use of insulin ([place]) 12/17/2008  . Chronic kidney disease, stage IV (severe) ([place]) 12/17/2008  . ALLERGIC RHINITIS, SEASONAL 12/15/2008  . OBESITY 04/07/2008  . DEPRESSIVE DISORDER 04/07/2008  . NEPHROLITHIASIS 11/02/2007  . UNSPECIFIED DENTAL CARIES 09/05/2007  . HEARING LOSS, TINNITUS 05/16/2007  . BACK PAIN 05/01/2007  . HYPERLIPIDEMIA 10/26/2006  . PSORIASIS 10/26/2006  . Hypothyroidism 06/16/2006  . HYPERTENSION 06/16/2006    Current Outpatient Prescriptions on File  Prior to Visit  Medication Sig Dispense Refill  . ACCU-CHEK AVIVA PLUS test strip CHECK BLOOD SUGAR D  5  . amLODipine (NORVASC) 10 MG tablet TK 1 T PO D  12  . cetirizine (ZYRTEC) 10 MG tablet Take 10 mg by mouth daily as needed for allergies.   5  . chlorhexidine (PERIDEX) 0.12 % solution 15 mLs.    . Cholecalciferol (VITAMIN D) 2000 units tablet Take by mouth.    . Cyanocobalamin (VITAMIN B-12) 1000 MCG SUBL Place 1 tablet under the tongue daily.    . DULoxetine (CYMBALTA) 30 MG capsule TK 1 C PO QAM  5  . fluticasone (FLONASE) 50 MCG/ACT nasal spray Place 1 spray into both nostrils 2 (two) times daily.    . hydrocortisone-pramoxine (PROCTOFOAM-HC) rectal foam Place 1 applicator rectally 2 (two) times daily. 10 g 0  . insulin glargine (LANTUS) 100 UNIT/ML injection Inject 0.25 mLs (25 Units total) into the skin at bedtime. 10 mL 11  . levothyroxine (SYNTHROID, LEVOTHROID) 150 MCG tablet TAKE 1 TABLET BY MOUTH DAILY*OBTAIN FUTURE REFILLS FROM PRIMARY DOCTOR* 90 tablet 0  . lidocaine (XYLOCAINE) 2 % jelly Apply topically.    . lidocaine (XYLOCAINE) 2 % solution 15 mLs.    . metoprolol (LOPRESSOR) 50 MG tablet Take 50 mg by mouth 2 (two) times daily.    . Nutritional Supplements (FEEDING SUPPLEMENT, OSMOLITE 1.5 CAL,) LIQD Give 1and  1/2 cans Osmolite 1.5 QID via PEG with 60 cc free water before and after bolus feedings. Drink or flush an additional 650 cc free water daily. 1422 mL 0  . oxyCODONE 10 MG TABS Take 1 tablet q 3 hrs PRN pain. 48 tablet 0  . polyethylene glycol (MIRALAX) packet Take 17 g by mouth daily. 30 each 1  . ranitidine (ZANTAC) 300 MG tablet Take 300 mg by mouth daily as needed for heartburn.    . Sucroferric Oxyhydroxide (VELPHORO PO) Take 1 tablet by mouth. With each meal    . vitamin C (ASCORBIC ACID) 500 MG tablet Take 500 mg by mouth daily.     No current facility-administered medications on file prior to visit.     Past Medical History:  Diagnosis Date  . Acute  pulmonary edema ([place]) 08/03/2015  . Anemia   . Anorectal ulcer 10/23/2015   and thrombosed internal hemorrhoids. with hematochezia  . Arthritis   . Back pain    myofascial, excacerberated in 6-7/09, 9/09- required narcotics at those times  . Cataract    Left eye( Dr. Katy Fitch, patient uninsured so she can't  get  an intervention)  . Depression    better on nortriptyline  . Diabetes mellitus   . Elbow fracture, right   . ESRD (end stage renal disease) ([place]) 09/2015   started HD in  09/2015. nephrolithiasis, s/p removal 12/05/07  . GERD (gastroesophageal reflux disease)   . H/O skin graft 06/21/2016   splint thickness skin graft leg  . Hyperplastic colon polyp 2001   f/u colonoscopy 11/23/06 was normal( Dr. Carlean Purl), falls into normal risk screening( next colonoscopy due in 2018)  . Hypertension   . Hypothyroidism   . Nephrolithiasis 11/2007, 01/2012  . Obesity   . Pap smear for cervical cancer screening    12/14/2005: normal, evidence of candida. 05/16/2007: normal, benign reperative changes.  . Pneumonia    as a child  . Sleep apnea   . Status post neck dissection 06/21/2016   partial glossectomy  . Tongue cancer ([place]) 06/08/15 bx   SCC of left lateral tongue     Past Surgical History:  Procedure Laterality Date  . AV FISTULA PLACEMENT Left 05/12/2015   Procedure: ARTERIOVENOUS (AV) FISTULA CREATION;  Surgeon: Serafina Mitchell, MD;  Location: [facility];  Service: Vascular;  Laterality: Left;  . AV FISTULA PLACEMENT  05/12/15   left fore arm  . CATARACT EXTRACTION Left   . DILATION AND CURETTAGE OF UTERUS    . FLEXIBLE SIGMOIDOSCOPY N/A 10/23/2015   Procedure: FLEXIBLE SIGMOIDOSCOPY;  Surgeon: Gatha Mayer, MD;  Location[facility] ENDOSCOPY;  Service: Endoscopy;  Laterality: N/A;  . free flap latissimus dorsi  06/21/2016   with neck dissection surgery  . FREE FLAP RADIAL FOREARM  06/21/2016   with neck dissection surgery  . glossectomy Left 10/06/15   partial gossectomy and neck dissection at  [facility].  1/18 neck nodes positive. SCC of tongue  . IR GENERIC HISTORICAL  07/14/2016   IR GASTROSTOMY TUBE MOD SED 07/14/2016 Corrie Mckusick, DO [facility]-INTERV RAD  . IR GENERIC HISTORICAL Left 07/28/2016   IR DIALY SHUNT INTRO NEEDLE/INTRACATH INITIAL W/IMG LEFT 07/28/2016 Aletta Edouard, MD [facility]INTER[facility] RAD  . LITHOTRIPSY  11/2007  . NECK DISSECTION  06/21/2016   partial glossectomy  . SKIN GRAFT SPLIT THICKNESS LEG / FOOT  06/21/2016   with neck dissection surgery  . tracheotomy  06/21/2016   with neck dissection surgery    Social  History   Social History  . Marital status: Married    Spouse name: N/A  . Number of children: 2  . Years of education: N/A   Occupational History  . retired    Social History Main Topics  . Smoking status: Never Smoker  . Smokeless tobacco: Never Used  . Alcohol use No  . Drug use: No  . Sexual activity: Not Asked   Other Topics Concern  . None   Social History Narrative   Married( 2nd marriage). Husband awaiting back surgery. BE AWARE THAT HUSBAND BROKE PAIN CONTRACT WITH [facility] SEVERAL TIMES!   Patient unemployed. Difficulty finding a jobb/c she is partially blind( but not blind enough to have disability). She used to work in [employer].. The grandson's mother basically   Son 91 y/o , unemployed and grandson lives with her. The grandson's mother basically dropped the child off when he was 43 y/o. They rarely hear from her They moved to an apartment from their house in 12/2008.    Family History  Problem Relation Age of Onset  . Diabetes Mother   . Heart disease Mother     before age 22  . Hypertension Mother         Review of Systems  Constitutional: Positive for fatigue. Negative for chills, fever and weight loss.  HENT: Negative for congestion and sore throat.   Eyes: Negative for blurred vision.  Respiratory: Negative for cough.   Cardiovascular: Positive for palpitations. Negative for chest pain.   Gastrointestinal: Positive for nausea. Negative for anorexia.  Neurological: Positive for vertigo and weakness. Negative for dizziness, tremors and headaches.  Endo/Heme/Allergies: Negative for polydipsia and polyphagia.  Psychiatric/Behavioral: Negative for confusion, decreased concentration and suicidal ideas. The patient is nervous/anxious. The patient does not have insomnia.     Objective:   Vitals:   09/13/16 1425  BP: 130/70  Pulse: 76  Temp: 97.8 F (36.6 C)    Body mass index is 33.46 kg/m.   Physical Examination:  Physical Exam  Constitutional: No distress.  HENT:  Head:    Right Ear: External ear normal.  Left Ear: External ear normal.  Nose: Nose normal.  Eyes: Conjunctivae are normal. Pupils are equal, round, and reactive to light.  Cardiovascular: Normal rate, regular rhythm and normal heart sounds.   Pulmonary/Chest: Effort normal and breath sounds normal.  Abdominal: Soft. Bowel sounds are normal. She exhibits no distension. There is no tenderness.  PEG tube in epigastric region, dry and intact surrounding skin.  Musculoskeletal: She exhibits no edema.  Neurological: She is alert.  Skin: Skin is warm and dry.  Vitals reviewed.   ASSESSMENT and PLAN:  Ilena was seen today for establish care.  Diagnoses and all orders for this visit:  Type 2 diabetes mellitus with diabetic neuropathy, with long-term current use of insulin ([place]) -     POCT HgB A1C -     Cancel: Microalbumin / creatinine urine ratio; Future -     Microalbumin / creatinine urine ratio; Future  Anxiety about health -     Ambulatory referral to Psychology  Squamous cell carcinoma of lateral tongue ([place]) -     Ambulatory referral to Psychology  Motion sickness, initial encounter -     promethazine (PHENERGAN) 25 MG tablet; Take 1 tablet (25 mg total) by mouth every 8 (eight) hours as needed for nausea or vomiting. -     meclizine (ANTIVERT) 12.5 MG tablet; Take 1 tablet (12.5 mg  total) by mouth 3 (three) times daily as needed for dizziness.    No problem-specific Assessment & Plan notes found for this encounter.    BMP Latest Ref Rng & Units 08/01/2016 07/29/2016 07/27/2016  Glucose 65 - 99 mg/dL 133(H) 107(H) 130(H)  BUN 6 - 20 mg/dL 29(H) 45(H) 51(H)  Creatinine 0.44 - 1.00 mg/dL 2.24(H) 3.52(H) 3.93(H)  Sodium 135 - 145 mmol/L 137 132(L) 134(L)  Potassium 3.5 - 5.1 mmol/L 3.9 3.9 4.2  Chloride 101 - 111 mmol/L 97(L) 92(L) 93(L)  CO2 22 - 32 mmol/L 26 30 28   Calcium 8.9 - 10.3 mg/dL 9.6 10.2 10.3    CBC Latest Ref Rng & Units 08/01/2016 07/29/2016 07/27/2016  WBC 4.0 - 10.5 K/uL 10.7(H) 9.9 12.1(H)  Hemoglobin 12.0 - 15.0 g/dL 7.9(L) 8.0(L) 8.5(L)  Hematocrit 36.0 - 46.0 % 23.2(L) 24.4(L) 25.8(L)  Platelets 150 - 400 K/uL 380 382 440(H)   Lab Results  Component Value Date   HGBA1C 5.8 09/13/2016   Follow up: Return in about 1 month (around 10/12/2016) for DM and HTN, hyperlipidemia (fasting).  Wilfred Lacy, NP

## 2016-09-13 NOTE — Progress Notes (Signed)
Pre visit review using our clinic review tool, if applicable. No additional management support is needed unless otherwise documented below in the visit note. 

## 2016-09-14 ENCOUNTER — Ambulatory Visit: Payer: Medicare Other

## 2016-09-15 ENCOUNTER — Ambulatory Visit: Payer: Medicare Other

## 2016-09-16 ENCOUNTER — Telehealth: Payer: Self-pay | Admitting: *Deleted

## 2016-09-16 ENCOUNTER — Ambulatory Visit: Payer: Medicare Other

## 2016-09-16 NOTE — Telephone Encounter (Signed)
Oncology Nurse Navigator Documentation  Called Ms. Kimmer and her husband on their respective phones, no answer, LVMM to return my call.  This is third day of missed RT appt, highly unusual not to hear from husband.  Called 911, spoke with [facility], requested wellness check; indicated patient has uncharacteristically missed treatments past 3 days, staff has not been able to reach her or husband by phone, neither of them are returning calls, husband usually prompt at returning calls.  Bill indicated check will be made, they will be asked to call me.  Gayleen Orem, RN, BSN, [place] [facility] Oncology Nurse [place] at [place] [phone]

## 2016-09-16 NOTE — Telephone Encounter (Signed)
Oncology Nurse Navigator Documentation  LVMM for patient and husband asking for return call re her well-being, Wed and Thurs missed appts, arrival for today's appt.  Gayleen Orem, RN, BSN, [place] [facility] Oncology Nurse [place] at [place] [phone]

## 2016-09-19 ENCOUNTER — Ambulatory Visit: Payer: Medicare Other | Attending: Radiation Oncology | Admitting: Physical Therapy

## 2016-09-19 ENCOUNTER — Ambulatory Visit
Admission: RE | Admit: 2016-09-19 | Discharge: 2016-09-19 | Disposition: A | Discharge status: Home / Self Care | Payer: Medicare Other | Source: Ambulatory Visit | Attending: Radiation Oncology | Admitting: Radiation Oncology

## 2016-09-19 ENCOUNTER — Telehealth: Payer: Self-pay | Admitting: *Deleted

## 2016-09-19 ENCOUNTER — Ambulatory Visit: Payer: Medicare Other

## 2016-09-19 ENCOUNTER — Encounter: Payer: Self-pay | Admitting: Radiation Oncology

## 2016-09-19 DIAGNOSIS — Z510 Encounter for antineoplastic radiation therapy: Secondary | ICD-10-CM | POA: Diagnosis not present

## 2016-09-19 DIAGNOSIS — C021 Malignant neoplasm of border of tongue: Secondary | ICD-10-CM

## 2016-09-19 MED ORDER — CEPHALEXIN 250 MG/5ML PO SUSR: 500.0000 mg | Freq: Three times a day (TID) | ORAL | 0 refills | Status: AC

## 2016-09-19 MED ORDER — LIDOCAINE VISCOUS 2 % MT SOLN: OROMUCOSAL | 5 refills | Status: AC

## 2016-09-19 MED ORDER — OXYCODONE HCL 10 MG PO TABS: ORAL_TABLET | ORAL | 0 refills | Status: DC

## 2016-09-19 NOTE — Telephone Encounter (Signed)
I do not manage her pain. She needs to contact oncologist for pain medication refills.

## 2016-09-19 NOTE — Progress Notes (Signed)
Tami Elliott presents for her 8th fraction of radiation to her Left Neck. She reports pain to her Left Ear and Face. She reports that she only has 5 mg tablets of oxycodone at home, and this does not help her pain at nighttime. She continues to have drainage from her Left Neck wound. She is drinking little water by mouth. She is receiving nutrition through her feeding tube. She is receiving 4 1/2 cans of osmolite daily with about 1000 cc water daily. She has thick saliva present which causes nausea at times.   BP 126/60   Pulse 70   Temp 97.4 F (36.3 C)   Ht 5' 4.5" (1.638 m)   SpO2 100% Comment: room air   Wt Readings from Last 3 Encounters:  09/13/16 198 lb (89.8 kg)  09/12/16 194 lb (88 kg)  09/05/16 195 lb 6.4 oz (88.6 kg)  She declined a weight today.

## 2016-09-19 NOTE — Telephone Encounter (Signed)
Oncology Nurse Navigator Documentation  Called Tami Elliott to check on her well-being.  LVMM asking her to return my call, reminded her of 1:15 RT appt his afternoon.  Unable to LVMM on husband's phone.  Called 911, was told officers made contact with Tami Elliott and her husband on Friday per my call.  Report indicated they were OK, were asked to call me.  Patient/husband said they wanted her to regain strength before continuing with treatments.  Dr. Isidore Moos and L3 notified.  Gayleen Orem, RN, BSN, [place] [facility] Oncology Nurse [place] at [place] [phone]

## 2016-09-19 NOTE — Telephone Encounter (Signed)
Called pt/husband no answer LMOM w/[place] response...Tami Elliott

## 2016-09-19 NOTE — Telephone Encounter (Signed)
Rec'd call from pt husband requesting refill on her pain med Oxycodone...Johny Chess

## 2016-09-20 ENCOUNTER — Ambulatory Visit: Payer: Medicare Other

## 2016-09-20 ENCOUNTER — Encounter: Payer: Self-pay | Admitting: *Deleted

## 2016-09-20 ENCOUNTER — Ambulatory Visit
Admission: RE | Admit: 2016-09-20 | Discharge: 2016-09-20 | Disposition: A | Discharge status: Home / Self Care | Payer: Medicare Other | Source: Ambulatory Visit | Attending: Radiation Oncology | Admitting: Radiation Oncology

## 2016-09-20 DIAGNOSIS — Z510 Encounter for antineoplastic radiation therapy: Secondary | ICD-10-CM | POA: Diagnosis not present

## 2016-09-20 NOTE — Telephone Encounter (Signed)
A user error has taken place: encounter opened in error, closed for administrative reasons.

## 2016-09-20 NOTE — Progress Notes (Signed)
   Weekly Management Note:  Outpatient    ICD-9-CM ICD-10-CM   1. 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  CBCT/MVCT images/Port film x-rays were reviewed.  The chart was checked.  She missed appointments last week and did not return calls - a wellness check was performed by the police.  She and husband are okay - he lost his phone, and she had not been feeling well. But, she resumed RT today. She has soreness in her neck and mouth.  Ran out of oxycodone 10mg  tabs.  Physical Findings:  Wt Readings from Last 3 Encounters:  09/13/16 198 lb (89.8 kg)  09/12/16 194 lb (88 kg)  09/05/16 195 lb 6.4 oz (88.6 kg)    height is 5' 4.5" (1.638 m). Her temperature is 97.4 F (36.3 C). Her blood pressure is 126/60 and her pulse is 70. Her oxygen saturation is 100%.  facial swelling, neck swelling, on left.  Oral cavity with thick white saliva, no thrush..  Continued neck scar on left is still open with milky drainage and possible cellulitis of skin vs tumor infiltration.     CBC    Component Value Date/Time   WBC 10.7 (H) 08/01/2016 0826   RBC 2.62 (L) 08/01/2016 0826   HGB 7.9 (L) 08/01/2016 0826   HCT 23.2 (L) 08/01/2016 0826   PLT 380 08/01/2016 0826   MCV 88.5 08/01/2016 0826   MCH 30.2 08/01/2016 0826   MCHC 34.1 08/01/2016 0826   RDW 16.4 (H) 08/01/2016 0826   LYMPHSABS 2.2 07/22/2016 0547   MONOABS 0.8 07/22/2016 0547   EOSABS 0.5 07/22/2016 0547   BASOSABS 0.0 07/22/2016 0547     CMP     Component Value Date/Time   NA 137 08/01/2016 0826   K 3.9 08/01/2016 0826   CL 97 (L) 08/01/2016 0826   CO2 26 08/01/2016 0826   GLUCOSE 133 (H) 08/01/2016 0826   BUN 29 (H) 08/01/2016 0826   CREATININE 2.24 (H) 08/01/2016 0826   CALCIUM 9.6 08/01/2016 0826   CALCIUM 9.0 07/13/2016 1030   PROT 6.9 07/09/2016 0624   ALBUMIN 3.2 (L) 08/01/2016 0826   AST 26 07/09/2016 0624   ALT 16 07/09/2016 0624   ALKPHOS 105 07/09/2016 0624   BILITOT 0.5 07/09/2016 0624   GFRNONAA 22 (L) 08/01/2016 0826   GFRAA 25 (L) 08/01/2016 [id number]     Impression:  The patient is tolerating radiotherapy.   Plan:  Continue radiotherapy as planned. [facility] RN will dress neck scar and give additional antibiotic ointment today. Afebrile. Will start empiric Keflex.  Rx oxycodone refill and lidocaine MMW for symptoms  -----------------------------------  Eppie Gibson, MD

## 2016-09-20 NOTE — Progress Notes (Signed)
Oncology Nurse Navigator Documentation  Met with Ms. Tobler and her husband during her RT.  I provided her a Epic appt calendar, noting specifically tomorrow's 2:00 swallowing eval and 2:30 MBS at [facility].  They voiced understanding of keeping this appt as previous appts were missed.  She noted, also, she wanted to cancel upcoming/future OP lymphedema appts d/t increased fatigue and uncertainty visits are helping.  I spoke to the value of lymphedema therapy, assured her she will see benefit is she continues.  She agreed to reconsider "when I get my strength back".  I noted I would relay her request to cancel.  Husband provided new phone number, I confirmed its Epic entry. They understand to call me with needs/concerns.  Gayleen Orem, RN, BSN, [place] [facility] Oncology Nurse [place] at [place] [phone]

## 2016-09-21 ENCOUNTER — Ambulatory Visit: Payer: Medicare Other

## 2016-09-21 ENCOUNTER — Ambulatory Visit: Payer: Self-pay | Admitting: Physical Therapy

## 2016-09-21 ENCOUNTER — Telehealth: Payer: Self-pay | Admitting: *Deleted

## 2016-09-21 ENCOUNTER — Ambulatory Visit (HOSPITAL_COMMUNITY): Admission: RE | Admit: 2016-09-21 | Payer: Medicare Other | Source: Ambulatory Visit

## 2016-09-21 ENCOUNTER — Other Ambulatory Visit (HOSPITAL_COMMUNITY): Payer: Self-pay

## 2016-09-21 NOTE — Telephone Encounter (Signed)
Oncology Nurse Navigator Documentation  Called patient husband in follow-up to missed RT and MBS appts.  LVMM asking him to return my call.  Gayleen Orem, RN, BSN, [place] [facility] Oncology Nurse [place] at [place] [phone]

## 2016-09-22 ENCOUNTER — Ambulatory Visit: Payer: Medicare Other

## 2016-09-23 ENCOUNTER — Ambulatory Visit: Payer: Medicare Other

## 2016-09-26 ENCOUNTER — Telehealth: Payer: Self-pay | Admitting: *Deleted

## 2016-09-26 ENCOUNTER — Ambulatory Visit: Payer: Medicare Other

## 2016-09-26 ENCOUNTER — Encounter: Payer: Self-pay | Admitting: Physical Therapy

## 2016-09-26 ENCOUNTER — Ambulatory Visit: Admission: RE | Admit: 2016-09-26 | Payer: Medicare Other | Source: Ambulatory Visit | Admitting: Radiation Oncology

## 2016-09-26 NOTE — Telephone Encounter (Signed)
Oncology Nurse Navigator Documentation  Called/spoke with patient's husband, Ronalee Belts, to confirm her 1:45 RT and WUT with Dr. Isidore Moos. He stated Tami Elliott does not want to come in today b/c she has been experiencing nausea and vomiting of supplement over the past several days. I encouraged her to keep, minimally, appt with Dr. Isidore Moos so she can be evaluated.   I recognized the difficulty both he and [name] have been having recently, offered to coordinate support from [employer], [employer].  He readily accepted.   He stated he would call me back re today's appts.  Note routed to [place].  Gayleen Orem, RN, BSN, [place] [facility] Oncology Nurse [place] at [place] [phone]

## 2016-09-27 ENCOUNTER — Ambulatory Visit: Payer: Medicare Other | Admitting: Radiation Oncology

## 2016-09-27 ENCOUNTER — Ambulatory Visit: Payer: Medicare Other

## 2016-09-28 ENCOUNTER — Encounter: Payer: Self-pay | Admitting: Physical Therapy

## 2016-09-28 ENCOUNTER — Ambulatory Visit: Payer: Medicare Other

## 2016-09-28 ENCOUNTER — Telehealth: Payer: Self-pay

## 2016-09-28 NOTE — Telephone Encounter (Signed)
Ms. Jesson missed her scheduled radiation appointment today. I called and was able to speak to her husband. He told me that Ms. Bjorklund was feeling much better today. He seemed confused and thought that her radiation appointments had been cancelled. He tells me that he will bring her in for tomorrow's appointment as it is scheduled. I have called [facility] with this information. They tell me to have her come in, and they will notify physics regarding her multiple missed appointments and how her treatment should continue from this point on.

## 2016-09-29 ENCOUNTER — Ambulatory Visit: Payer: Medicare Other

## 2016-09-29 ENCOUNTER — Telehealth: Payer: Self-pay

## 2016-09-29 ENCOUNTER — Encounter: Payer: Self-pay | Admitting: *Deleted

## 2016-09-29 NOTE — Telephone Encounter (Signed)
After typing my previous telephone note I received a call from Tami Elliott, who is an Therapist, sports with [facility]. She was at Tami Elliott's house visiting during our phone call. She called asking for a verbal order to continue Tami Elliott's home health care. I did get this verbal order from Dr. Lisbeth Renshaw and relayed it to [place]. While on the phone with her I was able to talk to Tami Elliott about his wife's radiation treatments. He indicated to me over the phone that he would have her here for her treatment on Monday 2/19 at 12:45. They know to call with any further questions or concerns.

## 2016-09-29 NOTE — Progress Notes (Signed)
[place] Work  Clinical Social Work was referred by patient navigator for assessment of psychosocial needs.  Clinical Social Worker attempted to contact patient by phone to offer support and assess for needs.  CSW left voicemail for patient's spouse to return call.    Polo Riley, MSW, LCSW, OSW-C Clinical Social Worker [facility] [phone]

## 2016-09-29 NOTE — Telephone Encounter (Signed)
I called and left a voice mail on Tami Elliott's mobile and home number regarding her missed radiation appointment today. Mr. Lebleu assured me yesterday that she would be here for this appointment today and she has not arrived as promised. I have advised them over voicemail that it is not advisable to receive radiation only one day in a weeks time, so it would not be appropriate to come for her radiation appointment tomorrow. I told them that she is scheduled for 12:45 on Monday 10/03/16, and we would really like for them to come for that appointment. I left my number so that they can call us with any updates.

## 2016-09-30 ENCOUNTER — Ambulatory Visit: Payer: Medicare Other

## 2016-10-03 ENCOUNTER — Ambulatory Visit: Payer: Medicare Other

## 2016-10-04 ENCOUNTER — Ambulatory Visit: Payer: Medicare Other

## 2016-10-04 ENCOUNTER — Telehealth: Payer: Self-pay | Admitting: *Deleted

## 2016-10-04 ENCOUNTER — Ambulatory Visit: Payer: Medicare Other | Admitting: Radiation Oncology

## 2016-10-04 NOTE — Telephone Encounter (Signed)
Oncology Nurse Navigator Documentation  Received call from Mr. Rabbani.  He requested:  Appt for his wife to see Dr. Isidore Moos.  I noted she is out of office today, appt potentially can be arranged for tomorrow or later this week.  He understands I will follow-up with him.   Refill for oxycodone.  I explained Dr. Isidore Moos needs to see/evaluate wife before reissuing.  He voiced understanding. Note routed to Dr. Isidore Moos.  Gayleen Orem, RN, BSN, [place] [facility] Oncology Nurse [place] at [place] [phone]

## 2016-10-05 ENCOUNTER — Other Ambulatory Visit: Payer: Self-pay | Admitting: Radiation Oncology

## 2016-10-05 ENCOUNTER — Ambulatory Visit: Payer: Medicare Other

## 2016-10-05 DIAGNOSIS — C021 Malignant neoplasm of border of tongue: Secondary | ICD-10-CM

## 2016-10-05 MED ORDER — OXYCODONE HCL 10 MG PO TABS: ORAL_TABLET | ORAL | 0 refills | Status: DC

## 2016-10-06 ENCOUNTER — Ambulatory Visit: Payer: Medicare Other

## 2016-10-06 ENCOUNTER — Encounter: Payer: Self-pay | Admitting: *Deleted

## 2016-10-06 ENCOUNTER — Other Ambulatory Visit: Payer: Self-pay | Admitting: *Deleted

## 2016-10-06 ENCOUNTER — Telehealth: Payer: Self-pay | Admitting: Nurse Practitioner

## 2016-10-06 DIAGNOSIS — C021 Malignant neoplasm of border of tongue: Secondary | ICD-10-CM

## 2016-10-06 NOTE — Telephone Encounter (Signed)
Tami Elliott called from hospice palliative care want to see if Tami Elliott will be attending physician for record for Tami Elliott? (they are treating symptoms). Please advise, they are hoping to get an answer before this weekend, advised Tami Elliott is out of the office until Monday.   Dr. Eppie Gibson was the one who refer pt to hospice but pt request for Tami Elliott to be attending physician of record.

## 2016-10-06 NOTE — Progress Notes (Addendum)
Oncology Nurse Navigator Documentation  Met with Tami Elliott when he arrived to pick up oxycodone Rx.    Per Dr. Pearlie Oyster guidance, I spoke with him regarding limited benefit of [name] continuing with RT, Dr. Pearlie Oyster recommendation for hospice referral.  I explained the comprehensive services provided by hospice including support for him.  He stated "I really need some help."   I asked him to talk with Tami Elliott when he returns home, call me with her/their decision.    He later called indicating they wish to proceed with hospice, I assured him referral will be placed promptly, placed order following his call. He expressed appreciation for support provided.  Gayleen Orem, RN, BSN, [place] [facility] Oncology Nurse [place] at [place] [phone]

## 2016-10-07 ENCOUNTER — Ambulatory Visit: Payer: Medicare Other

## 2016-10-07 NOTE — Telephone Encounter (Signed)
Hospice called again wanting to know if we could get Dr. Alain Marion to give the ok for this until Baldo Ash gets back. Please advise.

## 2016-10-07 NOTE — Telephone Encounter (Signed)
Routing to dr plotnikov, please advise, thanks 

## 2016-10-08 NOTE — Telephone Encounter (Signed)
OK. Thx

## 2016-10-10 ENCOUNTER — Ambulatory Visit: Payer: Medicare Other

## 2016-10-10 NOTE — Telephone Encounter (Signed)
Routing to [place]---fyi since you have now returned to office, thanks

## 2016-10-10 NOTE — Telephone Encounter (Signed)
Hospice is wanting to know if you will the the attending for this pt?    Best number [phone]

## 2016-10-11 ENCOUNTER — Ambulatory Visit: Payer: Medicare Other

## 2016-10-12 ENCOUNTER — Other Ambulatory Visit: Payer: Self-pay | Admitting: Radiation Oncology

## 2016-10-12 ENCOUNTER — Ambulatory Visit: Payer: Medicare Other

## 2016-10-12 ENCOUNTER — Telehealth: Payer: Self-pay | Admitting: *Deleted

## 2016-10-12 DIAGNOSIS — C021 Malignant neoplasm of border of tongue: Secondary | ICD-10-CM

## 2016-10-12 MED ORDER — OXYCODONE HCL 10 MG PO TABS: ORAL_TABLET | ORAL | 0 refills | Status: AC

## 2016-10-12 NOTE — Telephone Encounter (Signed)
Oncology Nurse Navigator Documentation  Received call from [name]'s husband, Tami Elliott.  He informed:  Hospice came by today to enroll Tami Elliott, initial RN visit tomorrow.    Tami Elliott has been taking oxycodone 10 mg as Rxed by Dr. Isidore Moos 2/21, is out.  Refill requested.  Dr. Sondra Come reissued Rx as prescribed by Dr. Isidore Moos, I called Tami Elliott to let him know Rx available for pick up.  Gayleen Orem, RN, BSN, [place] [facility] Oncology Nurse [place] at [place] [phone]

## 2016-10-12 NOTE — Telephone Encounter (Signed)
Unfortunately I will not be able to be the attending provider on this case. Please notify Amber with Hospice. Thank you

## 2016-10-13 ENCOUNTER — Ambulatory Visit: Payer: Medicare Other

## 2016-10-13 ENCOUNTER — Ambulatory Visit: Payer: Self-pay | Admitting: Nurse Practitioner

## 2016-10-14 ENCOUNTER — Encounter: Payer: Self-pay | Admitting: Radiation Oncology

## 2016-10-14 NOTE — Progress Notes (Signed)
  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 The patient's left neck scar remained open throughout treatment, and was noted to have milky drainage with possible cellulitis of the skin vs tumor infiltration. The patient experienced left-sided facial and neck swelling. The patient also reported thick saliva and a dry mouth.  She continued to have a poor performance status.  Her husband was difficult to reach when she missed treatments and at one point a wellness check was requested by the police department - patient was found to be okay and husband later expressed lack of phone coverage for inaccessibility.  Plan: The patient prematurely stopped radiation treatment. Nursing dressed the patient's neck scar and provided antibiotic ointment. The patient and husband were instructed on how to use this at home. The patient's oxycodone and lidocaine were refilled for symptoms. The patient was referred to hospice given her inability to attend palliative treatments and multiple comorbidities, and limited life expectancy.   -----------------------------------  Eppie Gibson, MD  This document serves as a record of services personally performed by Eppie Gibson, MD. It was created on her behalf by Maryla Morrow, a trained medical scribe. The creation of this record is based on the scribe's personal observations and the provider's statements to them. This document has been checked  and approved by the attending provider.

## 2016-10-17 ENCOUNTER — Other Ambulatory Visit: Payer: Self-pay | Admitting: Radiation Oncology

## 2016-10-17 DIAGNOSIS — E039 Hypothyroidism, unspecified: Secondary | ICD-10-CM

## 2016-10-22 ENCOUNTER — Encounter (HOSPITAL_COMMUNITY): Payer: Self-pay | Admitting: Neurology

## 2016-10-22 ENCOUNTER — Emergency Department (HOSPITAL_COMMUNITY)

## 2016-10-22 ENCOUNTER — Inpatient Hospital Stay (HOSPITAL_COMMUNITY)
Admission: EM | Admit: 2016-10-22 | Discharge: 2016-11-13 | DRG: 146 | Disposition: E | Attending: Internal Medicine | Admitting: Internal Medicine

## 2016-10-22 DIAGNOSIS — Z66 Do not resuscitate: Secondary | ICD-10-CM | POA: Diagnosis present

## 2016-10-22 DIAGNOSIS — N185 Chronic kidney disease, stage 5: Secondary | ICD-10-CM | POA: Diagnosis not present

## 2016-10-22 DIAGNOSIS — Z833 Family history of diabetes mellitus: Secondary | ICD-10-CM

## 2016-10-22 DIAGNOSIS — Z8249 Family history of ischemic heart disease and other diseases of the circulatory system: Secondary | ICD-10-CM | POA: Diagnosis not present

## 2016-10-22 DIAGNOSIS — Z6830 Body mass index (BMI) 30.0-30.9, adult: Secondary | ICD-10-CM | POA: Diagnosis not present

## 2016-10-22 DIAGNOSIS — G4730 Sleep apnea, unspecified: Secondary | ICD-10-CM | POA: Diagnosis present

## 2016-10-22 DIAGNOSIS — T17990A Other foreign object in respiratory tract, part unspecified in causing asphyxiation, initial encounter: Secondary | ICD-10-CM | POA: Diagnosis present

## 2016-10-22 DIAGNOSIS — E440 Moderate protein-calorie malnutrition: Secondary | ICD-10-CM | POA: Diagnosis present

## 2016-10-22 DIAGNOSIS — G931 Anoxic brain damage, not elsewhere classified: Secondary | ICD-10-CM | POA: Diagnosis present

## 2016-10-22 DIAGNOSIS — N186 End stage renal disease: Secondary | ICD-10-CM | POA: Diagnosis present

## 2016-10-22 DIAGNOSIS — E872 Acidosis, unspecified: Secondary | ICD-10-CM

## 2016-10-22 DIAGNOSIS — J9601 Acute respiratory failure with hypoxia: Secondary | ICD-10-CM | POA: Diagnosis present

## 2016-10-22 DIAGNOSIS — D638 Anemia in other chronic diseases classified elsewhere: Secondary | ICD-10-CM | POA: Diagnosis present

## 2016-10-22 DIAGNOSIS — C029 Malignant neoplasm of tongue, unspecified: Principal | ICD-10-CM | POA: Diagnosis present

## 2016-10-22 DIAGNOSIS — Z515 Encounter for palliative care: Secondary | ICD-10-CM | POA: Diagnosis present

## 2016-10-22 DIAGNOSIS — N179 Acute kidney failure, unspecified: Secondary | ICD-10-CM | POA: Diagnosis present

## 2016-10-22 DIAGNOSIS — I469 Cardiac arrest, cause unspecified: Secondary | ICD-10-CM | POA: Diagnosis present

## 2016-10-22 DIAGNOSIS — X58XXXA Exposure to other specified factors, initial encounter: Secondary | ICD-10-CM | POA: Diagnosis present

## 2016-10-22 DIAGNOSIS — E1122 Type 2 diabetes mellitus with diabetic chronic kidney disease: Secondary | ICD-10-CM | POA: Diagnosis present

## 2016-10-22 DIAGNOSIS — E875 Hyperkalemia: Secondary | ICD-10-CM | POA: Diagnosis present

## 2016-10-22 DIAGNOSIS — F329 Major depressive disorder, single episode, unspecified: Secondary | ICD-10-CM | POA: Diagnosis present

## 2016-10-22 DIAGNOSIS — Z794 Long term (current) use of insulin: Secondary | ICD-10-CM | POA: Diagnosis not present

## 2016-10-22 DIAGNOSIS — E669 Obesity, unspecified: Secondary | ICD-10-CM | POA: Diagnosis present

## 2016-10-22 DIAGNOSIS — C7989 Secondary malignant neoplasm of other specified sites: Secondary | ICD-10-CM | POA: Diagnosis present

## 2016-10-22 DIAGNOSIS — J810 Acute pulmonary edema: Secondary | ICD-10-CM | POA: Diagnosis present

## 2016-10-22 DIAGNOSIS — K219 Gastro-esophageal reflux disease without esophagitis: Secondary | ICD-10-CM | POA: Diagnosis present

## 2016-10-22 DIAGNOSIS — E039 Hypothyroidism, unspecified: Secondary | ICD-10-CM | POA: Diagnosis present

## 2016-10-22 DIAGNOSIS — Z930 Tracheostomy status: Secondary | ICD-10-CM

## 2016-10-22 DIAGNOSIS — D631 Anemia in chronic kidney disease: Secondary | ICD-10-CM

## 2016-10-22 LAB — URINALYSIS, ROUTINE W REFLEX MICROSCOPIC
Bilirubin Urine: NEGATIVE
Glucose, UA: 50 mg/dL — AB
KETONES UR: NEGATIVE mg/dL
Nitrite: NEGATIVE
Protein, ur: 100 mg/dL — AB
Specific Gravity, Urine: 1.015 (ref 1.005–1.030)
pH: 5 (ref 5.0–8.0)

## 2016-10-22 LAB — I-STAT CG4 LACTIC ACID, ED: Lactic Acid, Venous: 14.71 mmol/L (ref 0.5–1.9)

## 2016-10-22 LAB — CBC WITH DIFFERENTIAL/PLATELET
BASOS ABS: 0 10*3/uL (ref 0.0–0.1)
Basophils Relative: 0 %
EOS PCT: 0 %
Eosinophils Absolute: 0 10*3/uL (ref 0.0–0.7)
HEMATOCRIT: 26.4 % — AB (ref 36.0–46.0)
HEMOGLOBIN: 7.8 g/dL — AB (ref 12.0–15.0)
LYMPHS ABS: 1.3 10*3/uL (ref 0.7–4.0)
LYMPHS PCT: 5 %
MCH: 31.8 pg (ref 26.0–34.0)
MCHC: 29.5 g/dL — ABNORMAL LOW (ref 30.0–36.0)
MCV: 107.8 fL — AB (ref 78.0–100.0)
MONOS PCT: 3 %
Monocytes Absolute: 0.8 10*3/uL (ref 0.1–1.0)
Neutro Abs: 23.9 10*3/uL — ABNORMAL HIGH (ref 1.7–7.7)
Neutrophils Relative %: 92 %
Platelets: 436 10*3/uL — ABNORMAL HIGH (ref 150–400)
RBC: 2.45 MIL/uL — AB (ref 3.87–5.11)
RDW: 19.8 % — AB (ref 11.5–15.5)
WBC: 26 10*3/uL — AB (ref 4.0–10.5)

## 2016-10-22 LAB — I-STAT CHEM 8, ED
BUN: >140 mg/dL — ABNORMAL HIGH (ref 6–20)
Calcium, Ion: 1.03 mmol/L — ABNORMAL LOW (ref 1.15–1.40)
Chloride: 100 mmol/L — ABNORMAL LOW (ref 101–111)
Creatinine, Ser: 9 mg/dL — ABNORMAL HIGH (ref 0.44–1.00)
GLUCOSE: 65 mg/dL (ref 65–99)
HEMATOCRIT: 23 % — AB (ref 36.0–46.0)
HEMOGLOBIN: 7.8 g/dL — AB (ref 12.0–15.0)
POTASSIUM: 5.6 mmol/L — AB (ref 3.5–5.1)
Sodium: 138 mmol/L (ref 135–145)
TCO2: 20 mmol/L (ref 0–100)

## 2016-10-22 LAB — TROPONIN I: Troponin I: 0.1 ng/mL (ref <0.03)

## 2016-10-22 LAB — I-STAT ARTERIAL BLOOD GAS, ED
ACID-BASE DEFICIT: 9 mmol/L — AB (ref 0.0–2.0)
BICARBONATE: 18.6 mmol/L — AB (ref 20.0–28.0)
O2 SAT: 100 %
PCO2 ART: 47.7 mmHg (ref 32.0–48.0)
PO2 ART: 418 mmHg — AB (ref 83.0–108.0)
TCO2: 20 mmol/L (ref 0–100)
pH, Arterial: 7.199 — CL (ref 7.350–7.450)

## 2016-10-22 LAB — COMPREHENSIVE METABOLIC PANEL
ALT: 158 U/L — AB (ref 14–54)
AST: 210 U/L — AB (ref 15–41)
Albumin: 2.9 g/dL — ABNORMAL LOW (ref 3.5–5.0)
Alkaline Phosphatase: 121 U/L (ref 38–126)
Anion gap: 31 — ABNORMAL HIGH (ref 5–15)
BUN: 169 mg/dL — ABNORMAL HIGH (ref 6–20)
CHLORIDE: 95 mmol/L — AB (ref 101–111)
CO2: 15 mmol/L — AB (ref 22–32)
CREATININE: 10.06 mg/dL — AB (ref 0.44–1.00)
Calcium: 9.4 mg/dL (ref 8.9–10.3)
GFR calc non Af Amer: 4 mL/min — ABNORMAL LOW (ref >60)
GFR, EST AFRICAN AMERICAN: 4 mL/min — AB (ref >60)
Glucose, Bld: 65 mg/dL (ref 65–99)
POTASSIUM: 5.9 mmol/L — AB (ref 3.5–5.1)
SODIUM: 141 mmol/L (ref 135–145)
Total Bilirubin: 1 mg/dL (ref 0.3–1.2)
Total Protein: 7.5 g/dL (ref 6.5–8.1)

## 2016-10-22 LAB — I-STAT TROPONIN, ED: TROPONIN I, POC: 0 ng/mL (ref 0.00–0.08)

## 2016-10-22 LAB — MRSA PCR SCREENING: MRSA BY PCR: NEGATIVE

## 2016-10-22 LAB — LACTIC ACID, PLASMA: Lactic Acid, Venous: 10.2 mmol/L (ref 0.5–1.9)

## 2016-10-22 LAB — CBG MONITORING, ED
GLUCOSE-CAPILLARY: 71 mg/dL (ref 65–99)
GLUCOSE-CAPILLARY: 125 mg/dL — AB (ref 65–99)

## 2016-10-22 MED ORDER — ACETAMINOPHEN 325 MG PO TABS: 650.0000 mg | ORAL_TABLET | ORAL | Status: DC | PRN

## 2016-10-22 MED ORDER — INSULIN ASPART 100 UNIT/ML ~~LOC~~ SOLN
10.0000 [IU] | Freq: Once | SUBCUTANEOUS | Status: AC
  Administered 2016-10-22: 10 [IU] via INTRAVENOUS
  Filled 2016-10-22: qty 1

## 2016-10-22 MED ORDER — SUCCINYLCHOLINE CHLORIDE 20 MG/ML IJ SOLN
INTRAMUSCULAR | Status: AC | PRN
  Administered 2016-10-22: 100 mg via INTRAVENOUS

## 2016-10-22 MED ORDER — ALBUTEROL SULFATE (2.5 MG/3ML) 0.083% IN NEBU: 2.5000 mg | INHALATION_SOLUTION | RESPIRATORY_TRACT | Status: DC | PRN

## 2016-10-22 MED ORDER — HEPARIN SODIUM (PORCINE) 5000 UNIT/ML IJ SOLN: 5000.0000 [IU] | Freq: Three times a day (TID) | INTRAMUSCULAR | Status: DC

## 2016-10-22 MED ORDER — SODIUM CHLORIDE 0.9 % IV SOLN
INTRAVENOUS | Status: DC
  Administered 2016-10-22: 16:00:00 via INTRAVENOUS

## 2016-10-22 MED ORDER — SODIUM CHLORIDE 0.9 % IV BOLUS (SEPSIS)
1000.0000 mL | Freq: Once | INTRAVENOUS | Status: AC
  Administered 2016-10-22: 1000 mL via INTRAVENOUS

## 2016-10-22 MED ORDER — ONDANSETRON HCL 4 MG/2ML IJ SOLN: 4.0000 mg | Freq: Four times a day (QID) | INTRAMUSCULAR | Status: DC | PRN

## 2016-10-22 MED ORDER — MORPHINE SULFATE (PF) 4 MG/ML IV SOLN
2.0000 mg | INTRAVENOUS | Status: DC | PRN
  Administered 2016-10-22 – 2016-10-24 (×10): 4 mg via INTRAVENOUS
  Administered 2016-10-25: 2 mg via INTRAVENOUS
  Filled 2016-10-22 (×11): qty 1

## 2016-10-22 MED ORDER — ETOMIDATE 2 MG/ML IV SOLN
INTRAVENOUS | Status: AC | PRN
  Administered 2016-10-22: 20 mg via INTRAVENOUS

## 2016-10-22 MED ORDER — DEXTROSE 50 % IV SOLN
1.0000 | Freq: Once | INTRAVENOUS | Status: AC
  Administered 2016-10-22: 50 mL via INTRAVENOUS
  Filled 2016-10-22: qty 50

## 2016-10-22 MED ORDER — SODIUM BICARBONATE 8.4 % IV SOLN
50.0000 meq | Freq: Once | INTRAVENOUS | Status: AC
  Administered 2016-10-22: 50 meq via INTRAVENOUS
  Filled 2016-10-22: qty 50

## 2016-10-22 MED ORDER — SODIUM CHLORIDE 0.9 % IV SOLN: 250.0000 mL | INTRAVENOUS | Status: DC | PRN

## 2016-10-22 MED ORDER — PANTOPRAZOLE SODIUM 40 MG PO TBEC: 40.0000 mg | DELAYED_RELEASE_TABLET | Freq: Every day | ORAL | Status: DC

## 2016-10-22 NOTE — Sedation Documentation (Signed)
Sedation to change king airway.

## 2016-10-22 NOTE — ED Notes (Signed)
Bear hugger applied 

## 2016-10-22 NOTE — ED Notes (Signed)
Dr. Gilford Raid made aware of troponin results.

## 2016-10-22 NOTE — H&P (Signed)
PULMONARY / CRITICAL CARE MEDICINE   Name: Tami Elliott MRN: [id number] DOB: [birth year]    ADMISSION DATE:  10/20/2016 CONSULTATION DATE:  11/03/2016  REFERRING MD:  Dr. Gilford Raid / EDP   CHIEF COMPLAINT:  Cardiac Arrest   HISTORY OF PRESENT ILLNESS:   66 y/o F admitted on 3/10 after suffering a cardiac arrest.  The patient has a history of metastatic tongue cancer and ESRD for which she reportedly has missed HD for the last month.  On day of admission, she reportedly was found around noon unresponsive and pulseless.  Family activated EMS and there was question over code status but they reported they "wanted everything done".  She was given 2 epinephrine and had return of spontaneous circulation after ~ 15 minutes of CPR.  Unknown downtime prior to being found.  EMS placed a [facility] airway.  She was transported to the ER, airway exchanged.  Initial labs - Na 138, K 5.6, Cl 100, glucose 65, BUN >140, sr cr 9.0, lactic acid 14.71, troponin 0.0, WBC 26, Hgb 7.8, MCV 107 and platelets 436.  CXR showed mild right basilar atelectasis.    PCCM called for admission.  No family available.   PAST MEDICAL HISTORY :  She  has a past medical history of Acute pulmonary edema ([place]) (08/03/2015); Anemia; Anorectal ulcer (10/23/2015); Arthritis; Back pain; Cataract; Depression; Diabetes mellitus; Elbow fracture, right; ESRD (end stage renal disease) ([place]) (09/2015); GERD (gastroesophageal reflux disease); H/O skin graft (06/21/2016); Hyperplastic colon polyp (2001); Hypertension; Hypothyroidism; Nephrolithiasis (11/2007, 01/2012); Obesity; Pap smear for cervical cancer screening; Pneumonia; Sleep apnea; Status post neck dissection (06/21/2016); and Tongue cancer ([place]) (06/08/15 bx).  PAST SURGICAL HISTORY: She  has a past surgical history that includes Cataract extraction (Left); Lithotripsy (11/2007); Dilation and curettage of uterus; AV fistula placement (Left, 05/12/2015); AV fistula placement (05/12/15); Flexible  sigmoidoscopy (N/A, 10/23/2015); glossectomy (Left, 10/06/15); ir generic historical (07/14/2016); ir generic historical (Left, 07/28/2016); Neck dissection (06/21/2016); Free flap radial forearm (06/21/2016); Skin graft split thickness leg / foot (06/21/2016); tracheotomy (06/21/2016); and free flap latissimus dorsi (06/21/2016).  No Known Allergies  No current facility-administered medications on file prior to encounter.    Current Outpatient Prescriptions on File Prior to Encounter  Medication Sig  . ACCU-CHEK AVIVA PLUS test strip CHECK BLOOD SUGAR D  . amLODipine (NORVASC) 10 MG tablet TK 1 T PO D  . cephALEXin (KEFLEX) 250 MG/5ML suspension Place 10 mLs (500 mg total) into feeding tube 3 (three) times daily. Use until bottle is empty.  . cetirizine (ZYRTEC) 10 MG tablet Take 10 mg by mouth daily as needed for allergies.   . chlorhexidine (PERIDEX) 0.12 % solution 15 mLs.  . Cholecalciferol (VITAMIN D) 2000 units tablet Take by mouth.  . Cyanocobalamin (VITAMIN B-12) 1000 MCG SUBL Place 1 tablet under the tongue daily.  . DULoxetine (CYMBALTA) 30 MG capsule TK 1 C PO QAM  . fluticasone (FLONASE) 50 MCG/ACT nasal spray Place 1 spray into both nostrils 2 (two) times daily.  . hydrocortisone-pramoxine (PROCTOFOAM-HC) rectal foam Place 1 applicator rectally 2 (two) times daily.  . insulin glargine (LANTUS) 100 UNIT/ML injection Inject 0.25 mLs (25 Units total) into the skin at bedtime.  Marland Kitchen levothyroxine (SYNTHROID, LEVOTHROID) 150 MCG tablet TAKE 1 TABLET BY MOUTH DAILY*OBTAIN FUTURE REFILLS FROM PRIMARY DOCTOR*  . lidocaine (XYLOCAINE) 2 % jelly Apply topically.  . lidocaine (XYLOCAINE) 2 % solution caregiver: Mix 1part 2% viscous lidocaine, 1part H20. Swish and/or swallow 37mL of this mixture, up to  QID, PRN soreness  . meclizine (ANTIVERT) 12.5 MG tablet Take 1 tablet (12.5 mg total) by mouth 3 (three) times daily as needed for dizziness.  . metoprolol (LOPRESSOR) 50 MG tablet Take 50 mg by  mouth 2 (two) times daily.  . Nutritional Supplements (FEEDING SUPPLEMENT, OSMOLITE 1.5 CAL,) LIQD Give 1and  1/2 cans Osmolite 1.5 QID via PEG with 60 cc free water before and after bolus feedings. Drink or flush an additional 650 cc free water daily.  . Oxycodone HCl 10 MG TABS Take 1 tablet q 3 hrs PRN pain.  . polyethylene glycol (MIRALAX) packet Take 17 g by mouth daily.  . promethazine (PHENERGAN) 25 MG tablet Take 1 tablet (25 mg total) by mouth every 8 (eight) hours as needed for nausea or vomiting.  . ranitidine (ZANTAC) 300 MG tablet Take 300 mg by mouth daily as needed for heartburn.  . Sucroferric Oxyhydroxide (VELPHORO PO) Take 1 tablet by mouth. With each meal  . vitamin C (ASCORBIC ACID) 500 MG tablet Take 500 mg by mouth daily.    FAMILY HISTORY:  Her indicated that her mother is deceased. She indicated that her father is deceased. She indicated that her brother is deceased.    SOCIAL HISTORY: She  reports that she has never smoked. She has never used smokeless tobacco. She reports that she does not drink alcohol or use drugs.  REVIEW OF SYSTEMS:  Unable to complete as patient is altered on mechanical ventilation  SUBJECTIVE:  RN reports no family available   VITAL SIGNS: BP 97/58   Pulse (!) 48   Temp (!) 91.2 F (32.9 C) (Rectal)   Resp 23   Ht 5\' 6"  (1.676 m)   SpO2 98%   HEMODYNAMICS:    VENTILATOR SETTINGS: Vent Mode: PRVC FiO2 (%):  [100 %] 100 % Set Rate:  [18 bmp] 18 bmp Vt Set:  [480 mL] 480 mL PEEP:  [5 cmH20] 5 cmH20 Plateau Pressure:  [23 cmH20] 23 cmH20  INTAKE / OUTPUT: No intake/output data recorded.  PHYSICAL EXAMINATION: General:  Chronically critically ill appearing female on vent Neuro:  Pupils 3-48mm, sluggish, no response to verbal / physical stimuli  HEENT:  ETT, mm pink/moist, neck with radiation changes, surgical scars Cardiovascular:  s1s2 rrr, no m/r/g  Lungs:  Even/non-labored, lungs bilaterally clear  Abdomen:  Obese/soft,  bsx4 active  Musculoskeletal:  No acute deformities  Skin:  Warm/dry, no edema, LUE AVF, right forearm with old scar & ? Fungal infection with erythema and creamy yellow scaling  LABS:  BMET  Recent Labs Lab 11/06/2016 1227 10/24/2016 1238  NA 141 138  K 5.9* 5.6*  CL 95* 100*  CO2 15*  --   BUN 169* >140*  CREATININE 10.06* 9.00*  GLUCOSE 65 65    Electrolytes  Recent Labs Lab 10/28/2016 1227  CALCIUM 9.4    CBC  Recent Labs Lab 11/03/2016 1227 10/17/2016 1238  WBC 26.0*  --   HGB 7.8* 7.8*  HCT 26.4* 23.0*  PLT 436*  --     Coag's No results for input(s): APTT, INR in the last 168 hours.  Sepsis Markers  Recent Labs Lab 10/25/2016 1239  LATICACIDVEN 14.71*    ABG  Recent Labs Lab 10/27/2016 1322  PHART 7.199*  PCO2ART 47.7  PO2ART 418.0*    Liver Enzymes  Recent Labs Lab 11/10/2016 1227  AST 210*  ALT 158*  ALKPHOS 121  BILITOT 1.0  ALBUMIN 2.9*    Cardiac Enzymes  Recent  Labs Lab 11/01/2016 1227  TROPONINI 0.10*    Glucose  Recent Labs Lab 10/19/2016 1251 11/04/2016 1438  GLUCAP 71 125*    Imaging Dg Chest Portable 1 View  Result Date: 10/20/2016 CLINICAL DATA:  ETT placement, in hospice for throat cancer EXAM: PORTABLE CHEST 1 VIEW COMPARISON:  CT chest dated 08/23/2016 FINDINGS: Endotracheal tube terminates 2 cm above the carina. Mild right basilar atelectasis. Left lung is clear. No pleural effusion or pneumothorax. Enteric tube terminates in the proximal stomach. Surgical clips overlying the neck. Defibrillator pads overlying the left hemithorax. IMPRESSION: Endotracheal tube terminates 2 cm above the carina. Electronically Signed   By: Julian Hy M.D.   On: 10/15/2016 12:56     STUDIES:   CULTURES:   ANTIBIOTICS: Unasyn 3/10 >>   SIGNIFICANT EVENTS: 3/10  Admit post cardiac arrest   LINES/TUBES: ETT 3/10 >>  LUE AVF   DISCUSSION: 66 y/o F with metastatic tongue cancer admitted 3/10 post cardiac arrest.    ASSESSMENT / PLAN:  PULMONARY A: Acute Respiratory Failure - post arrest  Hx Prior Trach - secondary to tongue cancer / hemi-glossectomy P:   Continue vent support for now  Will plan for withdrawal of mechanical ventilation on family arrival PRN albuterol   CARDIOVASCULAR A:  Cardiac Arrest  P:  Admit to ICU  Tele monitoring  DNR in the event of arrest   RENAL A:   ESRD - on HD, missed last month of therapy  P:   No further labs   GASTROINTESTINAL A:   Moderate Protein Calorie Malnutrition  P:   NPO  HEMATOLOGIC A:   Metastatic Tongue Cancer s/p Hemi-glossectomy P:  Trend CBC No heparin / scd's as comfort care   INFECTIOUS A:   Leukocytosis - infection vs reactive  Right Lower Atelectasis  P:   Monitor closely  ENDOCRINE A:   At Risk Hypoglycemia    P:   Monitor glucose on BMP  NEUROLOGIC A:   Acute Encephalopathy secondary to Cardiopulmonary Arrest P:   Fentanyl PRN for pain  Versed PRN for sedation / discomfort   FAMILY  - Updates:  Husband's cell Ronalee Belts [phone]).  Have contacted [place] [facility] to let them know of her admission.  Discussed via phone regarding patients wishes - Ronalee Belts states she told him that she "was ready to go".  She has suffered tremendously with her illness.  He is going to gather the family and come to the hospital.  We discussed DNR in the event of arrest and he is agreeable.  Further, we discussed that this is not likely a survivable event for her and would recommend withdrawal of care on arrival.    - Inter-disciplinary family meet or Palliative Care meeting due by: completed on admit.   CC Time: 30 minutes  Noe Gens, NP-C [place] [facility] Pgr: [phone] or if no answer [phone] 10/25/2016, 3:34 PM

## 2016-10-22 NOTE — ED Provider Notes (Addendum)
[facility] DEPT Provider Note   CSN: [id number] Arrival date & time: 11/05/2016  1231     History   Chief Complaint Chief Complaint  Patient presents with  . Cardiac Arrest    HPI Tami Elliott is a 66 y.o. female.  Pt presents to the ED today s/p full arrest.  The pt has a hx of metastatic tongue cancer and kidney failure previously on dialysis.  The pt has recently gone on hospice and was in the process of getting her DNR.  The pt has not been to dialysis in 1 month.  Pt's family checked on her around noon and found her to be unresponsive and pulseless.  The family called EMS and said they wanted everything done.  Pt given 2 epi and had RSCM after 15 minutes of CPR.  EMS placed a king airway.  Pt is now in NSR and is breathing spontaneously.      Past Medical History:  Diagnosis Date  . Acute pulmonary edema ([place]) 08/03/2015  . Anemia   . Anorectal ulcer 10/23/2015   and thrombosed internal hemorrhoids. with hematochezia  . Arthritis   . Back pain    myofascial, excacerberated in 6-7/09, 9/09- required narcotics at those times  . Cataract    Left eye( Dr. Katy Fitch, patient uninsured so she can't  get  an intervention)  . Depression    better on nortriptyline  . Diabetes mellitus   . Elbow fracture, right   . ESRD (end stage renal disease) ([place]) 09/2015   started HD in  09/2015. nephrolithiasis, s/p removal 12/05/07  . GERD (gastroesophageal reflux disease)   . H/O skin graft 06/21/2016   splint thickness skin graft leg  . Hyperplastic colon polyp 2001   f/u colonoscopy 11/23/06 was normal( Dr. Carlean Purl), falls into normal risk screening( next colonoscopy due in 2018)  . Hypertension   . Hypothyroidism   . Nephrolithiasis 11/2007, 01/2012  . Obesity   . Pap smear for cervical cancer screening    12/14/2005: normal, evidence of candida. 05/16/2007: normal, benign reperative changes.  . Pneumonia    as a child  . Sleep apnea   . Status post neck dissection 06/21/2016   partial glossectomy  . Tongue cancer ([place]) 06/08/15 bx   SCC of left lateral tongue     Patient Active Problem List   Diagnosis Date Noted  . Squamous cell carcinoma of lateral tongue ([place]) 12/04/2015  . Anorectal ulcer 10/23/2015  . Hemorrhoid   . GI bleeding 10/21/2015  . Acute GI bleeding 10/21/2015  . Acute blood loss anemia 10/21/2015  . Acute hypoxemic respiratory failure ([place]) 08/04/2015  . Hyperkalemia   . Respiratory acidosis   . Acute on chronic renal failure ([place])   . Acute pulmonary edema (HCC)   . ESRD (end stage renal disease) ([place])   . Tongue cancer ([place]) 06/16/2015  . Acute renal failure ([place]) 01/17/2012  . Hydronephrosis 01/17/2012  . Nephrolithiasis 01/17/2012  . BLISTER, FOOT 06/25/2009  . Type 2 diabetes mellitus with diabetic neuropathy, with long-term current use of insulin ([place]) 12/17/2008  . Chronic kidney disease, stage IV (severe) ([place]) 12/17/2008  . ALLERGIC RHINITIS, SEASONAL 12/15/2008  . OBESITY 04/07/2008  . DEPRESSIVE DISORDER 04/07/2008  . NEPHROLITHIASIS 11/02/2007  . UNSPECIFIED DENTAL CARIES 09/05/2007  . HEARING LOSS, TINNITUS 05/16/2007  . BACK PAIN 05/01/2007  . HYPERLIPIDEMIA 10/26/2006  . PSORIASIS 10/26/2006  . Hypothyroidism 06/16/2006  . HYPERTENSION 06/16/2006    Past Surgical History:  Procedure Laterality  Date  . AV FISTULA PLACEMENT Left 05/12/2015   Procedure: ARTERIOVENOUS (AV) FISTULA CREATION;  Surgeon: Serafina Mitchell, MD;  Location: [facility];  Service: Vascular;  Laterality: Left;  . AV FISTULA PLACEMENT  05/12/15   left fore arm  . CATARACT EXTRACTION Left   . DILATION AND CURETTAGE OF UTERUS    . FLEXIBLE SIGMOIDOSCOPY N/A 10/23/2015   Procedure: FLEXIBLE SIGMOIDOSCOPY;  Surgeon: Gatha Mayer, MD;  Location: [facility] ENDOSCOPY;  Service: Endoscopy;  Laterality: N/A;  . free flap latissimus dorsi  06/21/2016   with neck dissection surgery  . FREE FLAP RADIAL FOREARM  06/21/2016   with neck dissection surgery  .  glossectomy Left 10/06/15   partial gossectomy and neck dissection at [facility].  1/18 neck nodes positive. SCC of tongue  . IR GENERIC HISTORICAL  07/14/2016   IR GASTROSTOMY TUBE MOD SED 07/14/2016 Corrie Mckusick, DO [facility]-INTERV RAD  . IR GENERIC HISTORICAL Left 07/28/2016   IR DIALY SHUNT INTRO NEEDLE/INTRACATH INITIAL W/IMG LEFT 07/28/2016 Aletta Edouard, MD [facility]INTER[facility]  . LITHOTRIPSY  11/2007  . NECK DISSECTION  06/21/2016   partial glossectomy  . SKIN GRAFT SPLIT THICKNESS LEG / FOOT  06/21/2016   with neck dissection surgery  . tracheotomy  06/21/2016   with neck dissection surgery    OB History    No data available       Home Medications    Prior to Admission medications   Medication Sig Start Date End Date Taking? Authorizing Provider  ACCU-CHEK AVIVA PLUS test strip CHECK BLOOD SUGAR D 11/21/15   Historical Provider, MD  amLODipine (NORVASC) 10 MG tablet TK 1 T PO D 11/23/15   Historical Provider, MD  cephALEXin (KEFLEX) 250 MG/5ML suspension Place 10 mLs (500 mg total) into feeding tube 3 (three) times daily. Use until bottle is empty. 09/19/16   Eppie Gibson, MD  cetirizine (ZYRTEC) 10 MG tablet Take 10 mg by mouth daily as needed for allergies.  04/02/15   Historical Provider, MD  chlorhexidine (PERIDEX) 0.12 % solution 15 mLs. 07/08/16   Historical Provider, MD  Cholecalciferol (VITAMIN D) 2000 units tablet Take by mouth.    Historical Provider, MD  Cyanocobalamin (VITAMIN B-12) 1000 MCG SUBL Place 1 tablet under the tongue daily.    Historical Provider, MD  DULoxetine (CYMBALTA) 30 MG capsule TK 1 C PO QAM 12/16/15   Historical Provider, MD  fluticasone (FLONASE) 50 MCG/ACT nasal spray Place 1 spray into both nostrils 2 (two) times daily.    Historical Provider, MD  hydrocortisone-pramoxine [facility]) rectal foam Place 1 applicator rectally 2 (two) times daily. 10/30/15   Verlee Monte, MD  insulin glargine (LANTUS) 100 UNIT/ML injection Inject 0.25 mLs (25 Units  total) into the skin at bedtime. 08/08/15   Thurnell Lose, MD  levothyroxine (SYNTHROID, LEVOTHROID) 150 MCG tablet TAKE 1 TABLET BY MOUTH DAILY*OBTAIN FUTURE REFILLS FROM PRIMARY DOCTOR* 09/09/16   Eppie Gibson, MD  lidocaine (XYLOCAINE) 2 % jelly Apply topically.    Historical Provider, MD  lidocaine (XYLOCAINE) 2 % solution caregiver: Mix 1part 2% viscous lidocaine, 1part H20. Swish and/or swallow 28mL of this mixture, up to QID, PRN soreness 09/19/16   Eppie Gibson, MD  meclizine (ANTIVERT) 12.5 MG tablet Take 1 tablet (12.5 mg total) by mouth 3 (three) times daily as needed for dizziness. 09/13/16   Flossie Buffy, NP  metoprolol (LOPRESSOR) 50 MG tablet Take 50 mg by mouth 2 (two) times daily.    Historical Provider,  MD  Nutritional Supplements (FEEDING SUPPLEMENT, OSMOLITE 1.5 CAL,) LIQD Give 1and  1/2 cans Osmolite 1.5 QID via PEG with 60 cc free water before and after bolus feedings. Drink or flush an additional 650 cc free water daily. 08/29/16   Eppie Gibson, MD  Oxycodone HCl 10 MG TABS Take 1 tablet q 3 hrs PRN pain. 10/12/16   Gery Pray, MD  polyethylene glycol [facility]) packet Take 17 g by mouth daily. 10/30/15   Verlee Monte, MD  promethazine (PHENERGAN) 25 MG tablet Take 1 tablet (25 mg total) by mouth every 8 (eight) hours as needed for nausea or vomiting. 09/13/16   Flossie Buffy, NP  ranitidine (ZANTAC) 300 MG tablet Take 300 mg by mouth daily as needed for heartburn.    Historical Provider, MD  Sucroferric Oxyhydroxide (VELPHORO PO) Take 1 tablet by mouth. With each meal    Historical Provider, MD  vitamin C (ASCORBIC ACID) 500 MG tablet Take 500 mg by mouth daily.    Historical Provider, MD    Family History Family History  Problem Relation Age of Onset  . Diabetes Mother   . Heart disease Mother     before age 19  . Hypertension Mother     Social History Social History  Substance Use Topics  . Smoking status: Never Smoker  . Smokeless tobacco: Never Used  .  Alcohol use No     Allergies   Patient has no known allergies.   Review of Systems Review of Systems  Unable to perform ROS: Mental status change     Physical Exam Updated Vital Signs BP 99/57   Pulse 64   Temp (!) 91.2 F (32.9 C) (Rectal)   Resp 18   Ht 5\' 6"  (1.676 m)   SpO2 100%   Physical Exam  Constitutional: She appears well-developed and well-nourished.  HENT:  Head: Normocephalic.  Nose: Nose normal.  Mouth/Throat: Mucous membranes are dry.  Evidence of prior surgery on tongue  Eyes: Pupils are equal, round, and reactive to light.  Cardiovascular: Normal rate, regular rhythm and normal heart sounds.   Pulmonary/Chest: Bradypnea noted.  Some spontaneous breaths  Abdominal: Soft. Bowel sounds are normal.  Feeding tube in place  Neurological:  Pt is unresponsive  Skin: Skin is warm.  Nursing note and vitals reviewed.    ED Treatments / Results  Labs (all labs ordered are listed, but only abnormal results are displayed) Labs Reviewed  CBC WITH DIFFERENTIAL/PLATELET - Abnormal; Notable for the following:       Result Value   WBC 26.0 (*)    RBC 2.45 (*)    Hemoglobin 7.8 (*)    HCT 26.4 (*)    MCV 107.8 (*)    MCHC 29.5 (*)    RDW 19.8 (*)    Platelets 436 (*)    All other components within normal limits  URINALYSIS, ROUTINE W REFLEX MICROSCOPIC - Abnormal; Notable for the following:    APPearance CLOUDY (*)    Glucose, UA 50 (*)    Hgb urine dipstick SMALL (*)    Protein, ur 100 (*)    Leukocytes, UA TRACE (*)    Bacteria, UA RARE (*)    Squamous Epithelial / LPF 6-30 (*)    All other components within normal limits  I-STAT CHEM 8, ED - Abnormal; Notable for the following:    Potassium 5.6 (*)    Chloride 100 (*)    BUN >140 (*)    Creatinine, Ser 9.00 (*)  Calcium, Ion 1.03 (*)    Hemoglobin 7.8 (*)    HCT 23.0 (*)    All other components within normal limits  I-STAT ARTERIAL BLOOD GAS, ED - Abnormal; Notable for the following:     pH, Arterial 7.199 (*)    pO2, Arterial 418.0 (*)    Bicarbonate 18.6 (*)    Acid-base deficit 9.0 (*)    All other components within normal limits  I-STAT CG4 LACTIC ACID, ED - Abnormal; Notable for the following:    Lactic Acid, Venous 14.71 (*)    All other components within normal limits  COMPREHENSIVE METABOLIC PANEL  TROPONIN I  LACTIC ACID, PLASMA  I-STAT TROPOININ, ED  CBG MONITORING, ED    EKG  EKG Interpretation  Date/Time:  Saturday October 22 2016 12:59:56 EST Ventricular Rate:  60 PR Interval:    QRS Duration: 168 QT Interval:  500 QTC Calculation: 500 R Axis:   -47 Text Interpretation:  Sinus rhythm RBBB and LAFB No acute changes Confirmed by Deon Duer MD, Morley Gaumer ([id number]) on 10/31/2016 1:12:11 PM       Radiology Dg Chest Portable 1 View  Result Date: 10/24/2016 CLINICAL DATA:  ETT placement, in hospice for throat cancer EXAM: PORTABLE CHEST 1 VIEW COMPARISON:  CT chest dated 08/23/2016 FINDINGS: Endotracheal tube terminates 2 cm above the carina. Mild right basilar atelectasis. Left lung is clear. No pleural effusion or pneumothorax. Enteric tube terminates in the proximal stomach. Surgical clips overlying the neck. Defibrillator pads overlying the left hemithorax. IMPRESSION: Endotracheal tube terminates 2 cm above the carina. Electronically Signed   By: Julian Hy M.D.   On: 11/03/2016 12:56    Procedures Procedure Name: Intubation Date/Time: 11/01/2016 1:54 PM Performed by: Isla Pence Pre-anesthesia Checklist: Patient identified Preoxygenation: Pre-oxygenation with 100% oxygen Intubation Type: Rapid sequence Laryngoscope Size: Glidescope and 3 Grade View: Grade II Tube size: 7.5 mm Number of attempts: 1 Airway Equipment and Method: Video-laryngoscopy Placement Confirmation: ETT inserted through vocal cords under direct vision,  Positive ETCO2,  CO2 detector and Breath sounds checked- equal and bilateral Secured at: 22 cm Tube secured with: ETT  holder Dental Injury: Teeth and Oropharynx as per pre-operative assessment  Comments: EMS had placed a king airway.  Pt bagged through that until we could change out tube.      (including critical care time)  Medications Ordered in ED Medications  etomidate (AMIDATE) injection (20 mg Intravenous Given 10/24/2016 1235)  succinylcholine (ANECTINE) injection (100 mg Intravenous Given 10/19/2016 1236)  sodium chloride 0.9 % bolus 1,000 mL (0 mLs Intravenous Stopped 11/06/2016 1343)  dextrose 50 % solution 50 mL (50 mLs Intravenous Given 10/15/2016 1306)  insulin aspart (novoLOG) injection 10 Units (10 Units Intravenous Given 11/07/2016 1307)  sodium bicarbonate injection 50 mEq (50 mEq Intravenous Given 10/13/2016 1309)     Initial Impression / Assessment and Plan / ED Course  I have reviewed the triage vital signs and the nursing notes.  Pertinent labs & imaging results that were available during my care of the patient were reviewed by me and considered in my medical decision making (see chart for details).     CRITICAL CARE Performed by: Isla Pence   Total critical care time: 30 minutes  Critical care time was exclusive of separately billable procedures and treating other patients.  Critical care was necessary to treat or prevent imminent or life-threatening deterioration.  Critical care was time spent personally by me on the following activities: development of treatment plan with patient  and/or surrogate as well as nursing, discussions with consultants, evaluation of patient's response to treatment, examination of patient, obtaining history from patient or surrogate, ordering and performing treatments and interventions, ordering and review of laboratory studies, ordering and review of radiographic studies, pulse oximetry and re-evaluation of patient's condition.  In case pt's code was due to hyperkalemia (even though potassium is not that high), I gave pt insulin/glucose and bicarb.  Anemia  is chronic.  There have been no family members to come see patient and they have not been answering the phone (nurse has called multiple times), so I have been unable to discuss pt's code status with them.  I spoke with Dr. Halford Chessman from critical care who will admit pt.  Final Clinical Impressions(s) / ED Diagnoses   Final diagnoses:  Cardiac arrest ([place])  Acute renal failure superimposed on stage 5 chronic kidney disease, not on chronic dialysis, unspecified acute renal failure type (HCC)  Lactic acidosis  Hyperkalemia  Anemia associated with stage 5 chronic renal failure [facility])    New Prescriptions New Prescriptions   No medications on file     Isla Pence, MD 10/21/2016 1358    Isla Pence, MD 10/24/2016 [phone]

## 2016-10-22 NOTE — Progress Notes (Signed)
RT assisted in pt transport on vent from ED to 4N25. Pt's vitals remained stable throughout.

## 2016-10-22 NOTE — ED Triage Notes (Signed)
Per ems- pt is hospice for throat cancer. Has not been to dialysis in 1 month. This morning family checked on patient, they said she was asleep but breathing. Around noon they found her pulseless, unresponsive. They started CPR. Ems initial rhythm was asystole. Gave 2 epi, had ROSC (1211) after 15 minutes of CPR. King airway.

## 2016-10-22 NOTE — ED Notes (Signed)
No family has arrived. Attempt to call patient's husband (# listed in contacts) ,but no answer.

## 2016-10-22 NOTE — Progress Notes (Signed)
Duplicated airway charted by MD at 1356. Pt intubated 10/20/2016 at 12:37 and airway was charted by RN.  Duplicate airway removed.

## 2016-10-23 MED ORDER — CHLORHEXIDINE GLUCONATE 0.12% ORAL RINSE (MEDLINE KIT)
15.0000 mL | Freq: Two times a day (BID) | OROMUCOSAL | Status: DC
  Administered 2016-10-23 – 2016-10-26 (×7): 15 mL via OROMUCOSAL

## 2016-10-23 MED ORDER — POLYVINYL ALCOHOL 1.4 % OP SOLN
1.0000 [drp] | Freq: Four times a day (QID) | OPHTHALMIC | Status: DC
  Administered 2016-10-23 (×3): 1 [drp] via OPHTHALMIC
  Filled 2016-10-23: qty 15

## 2016-10-23 MED ORDER — POLYVINYL ALCOHOL 1.4 % OP SOLN
1.0000 [drp] | Freq: Four times a day (QID) | OPHTHALMIC | Status: DC
  Administered 2016-10-23 – 2016-10-25 (×10): 1 [drp] via OPHTHALMIC

## 2016-10-23 MED ORDER — ORAL CARE MOUTH RINSE
15.0000 mL | Freq: Four times a day (QID) | OROMUCOSAL | Status: DC
  Administered 2016-10-23 – 2016-10-26 (×13): 15 mL via OROMUCOSAL

## 2016-10-23 MED ORDER — HYPROMELLOSE (GONIOSCOPIC) 2.5 % OP SOLN
1.0000 [drp] | Freq: Four times a day (QID) | OPHTHALMIC | Status: DC
  Filled 2016-10-23: qty 15

## 2016-10-23 NOTE — Progress Notes (Signed)
PCCM Progress Note  66 yo female presented with cardiac arrest.  She has hx of metastatic tongue cancer and ESRD.  She has not received HD in several weeks, had been declining other therapies, and was in the process of enrolling in hospice.  She was intubated and resuscitated in the ER.  After d/w family, decision was made for DNR status and not to pursue any additional therapies.  BP (!) 134/58   Pulse 75   Temp 99.5 F (37.5 C) (Axillary)   Resp 18   Ht 5\' 6"  (1.676 m)   Wt 186 lb 1.1 oz (84.4 kg)   SpO2 100%   BMI 30.03 kg/m   General - ill appearing Neuro - unresponsive HEENT - ETT in place Cardiac - regular Chest - b/l crackles Abd - soft, non tender Ext - 1+ edema Skin - induration over Lt neck  Assessment: Cardiac arrest from hyperkalemia and metabolic acidosis Lactic acidosis Acute hypoxic respiratory failure Anoxic encephalopathy ESRD Metastatic tongue cancer Acute pulmonary edema Hx of depression DM type II Hx of hypothyroidism Anemia of critical illness and chronic disease  Plan: DNR Transition to comfort care with vent withdrawal when family arrives  Chesley Mires, MD [place] 10/23/2016, 8:09 AM Pager:  [phone] After 3pm call: [phone]

## 2016-10-23 NOTE — Progress Notes (Signed)
1125--[facility] Hospital Liaison RN Visit [facility] 4N-25  This is GIP related and covered condition per Dr. Tomasa Hosteller hospital admission of 10/25/2016 with an [facility] diagnosis of malignant neoplasm of tongue. Code status is DNR.  Visited pt in room. No family present initially and then family friends arrived. They are friends of the son living in [place] and state that they do not know the husband of the patient. They state that the son in [place] is on his way to [place] but will not arrive before 10/24/16 evening.   Pt not responsive. Pt admitted 3/10 after suffering cardiac arrest. Was found around noon unresponsive and pulseless. Family activated EMS and there was question over code status but they reported they "wanted everything done". She was given 2 epinephrine and had return of spontaneous circulation after approx 15 minutes of CPR. Unknown downtime prior to being found. Pt has ESRD. Has not received HD in approx 1 month and had been declining other therapies. After discussion with family in the hospital setting, decision was made for DNR and not to pursue additional therapies.  On ventilator Servo I,Mode:  PSV; CPAP, FiO2 40%, Pressure support 10, PEEP 5. RR 16 spontaneous.   Per chart review cardiac arrest from hyperkalemia and metabolic acidosis, lactic acidosis, acute hypoxic respiratory failure, anoxic encephalopathy, acute pulmonary edema.  Plan is to transition to comfort care with vent withdrawal when family arrives.  Spoke with Dr. Halford Chessman regarding son from [place] arriving tomorrow evening. Per Dr. Halford Chessman husband had gone home to rest and will be returning this afternoon for withdrawal.  [facility] will continue to follow.  Please feel free to call with any questions or concerns.  Thank you, [facility] Liaison [phone]  [place] are now on [place]. Please feel free to call me at the above number or [phone] after hours.

## 2016-10-24 DIAGNOSIS — E872 Acidosis, unspecified: Secondary | ICD-10-CM

## 2016-10-24 DIAGNOSIS — N185 Chronic kidney disease, stage 5: Secondary | ICD-10-CM

## 2016-10-24 DIAGNOSIS — E875 Hyperkalemia: Secondary | ICD-10-CM

## 2016-10-24 DIAGNOSIS — N179 Acute kidney failure, unspecified: Secondary | ICD-10-CM

## 2016-10-24 DIAGNOSIS — I469 Cardiac arrest, cause unspecified: Secondary | ICD-10-CM

## 2016-10-24 NOTE — Progress Notes (Signed)
[facility]  Patient was resting, not in distress. Patient was not awake. No family present and LCSW called, left message to request follow up at hospital or to [facility].  RN, Lenna Sciara offered updates to patient status. Patient to wean when family are available.  Katherina Right, LCSW

## 2016-10-24 NOTE — Progress Notes (Signed)
PCCM Progress Note  66 yo female presented with cardiac arrest.  She has hx of metastatic tongue cancer and ESRD.  She has not received HD in several weeks, had been declining other therapies, and was in the process of enrolling in hospice.  She was intubated and resuscitated in the ER.  After d/w family, decision was made for DNR status and not to pursue any additional therapies.  BP (!) 136/57 (BP Location: Right Leg)   Pulse 78   Temp 98.2 F (36.8 C) (Axillary)   Resp 16   Ht 5\' 6"  (1.676 m)   Wt 84.4 kg (186 lb 1.1 oz)   SpO2 95%   BMI 30.03 kg/m   General - ill appearing Neuro - follows commands, moves ext, moves arms, pupi lft 4, rt 2 sluggish HEENT - ETT in place Cardiac - regular s1 s2  Chest - ronchi Abd - soft, non tender Ext - 1+ edema Skin - induration over Lt neck  Assessment: Cardiac arrest from hyperkalemia and metabolic acidosis Lactic acidosis Acute hypoxic respiratory failure Anoxic encephalopathy ESRD Metastatic tongue cancer Acute pulmonary edema Hx of depression DM type II Hx of hypothyroidism Anemia of critical illness and chronic disease  Plan: DNR Follow commands per Rn this am  I reviewed all scans and pcxr, wonder if mass obstructing airway as cause arrest>? And /or secretions from same ABg reviewed last, consider rate increase f not for immediate comfort approach Would re assess K if continued support Im call ing husband now, this is cruel, she requires comfort care today. Son of [place] on way, would of course wait for that son Can attempt cpap 5 ps5, and after d/w husband, add morphine drip, concern is she wil have airway compromised quickly  Ccm time 30 min  Lavon Paganini. Titus Mould, MD, [place] Pgr: [facility]

## 2016-10-24 NOTE — Progress Notes (Signed)
Nutrition Brief Note  Chart reviewed. Pt with hx of metastatic tongue cancer and ESRD admitted after cardiac arrest.  Per MD comfort care recommended. Waiting on family.  No nutrition interventions planned at this time.  Please consult as needed.   [place], [place], [place] Pager [phone] After Hours Pager

## 2016-10-24 NOTE — Progress Notes (Addendum)
[facility] GIP RN Visit at 2:00pm  This is a covered and related admission from 10/21/2016 with a [facility] diagnosis of malignant neoplasm of tongue.  Code Status is DNR.  Patient admitted after suffering cardiac arrest.  Patient was found around noon that day unresponsive and pulseless. Family activated EMS and there was some question over code status.  Patient family expressed they "wanted everything done".  EMS performed CPR on patient and was able to get back ROSC after approximately 15 minutes.  Unknown downtime of patient.    Of note:  Patient has not received hemodialysis in approximately 1 month and had been declining other therapies.  Patient has been made DNR after coming in to the hospital.  Visited with patient in her room.  Patient remains on ventilator.  Patient did not respond to me at all.   There was no family at bedside.  Patient appeared to be resting comfortably and NAD noted.  Patient has no continuous medications running at this time.  No antibiotics being given.  Patient received PRN medication of Morphine 4 MG/ML injection 2-4 mg, Dose 2 -4 mg Q1H PRN via IV at [place], [place] and 1242 so far today.   Will continue to follow patient and anticipate needs for discharge.  Thank you,  Edyth Gunnels, RN, [facility] Liaison [phone]  All hospital liaison's are now on [place].

## 2016-10-25 MED ORDER — PANTOPRAZOLE SODIUM 40 MG IV SOLR
40.0000 mg | INTRAVENOUS | Status: DC
  Administered 2016-10-25: 40 mg via INTRAVENOUS
  Filled 2016-10-25: qty 40

## 2016-10-25 NOTE — Progress Notes (Addendum)
[place] GIP RN Visit at 1000 am  This is a covered and related admission from 10/24/2016 with a [facility] diagnosis of malignant neoplasm of tongue.  Code Status is DNR.  Patient admitted after suffering cardiac arrest.  Patient was found around noon that day unresponsive and pulseless. Family activated EMS and there was some question over code status.  Patient family expressed they "wanted everything done".  EMS performed CPR on patient and was able to get back ROSC after approximately 15 minutes.  Unknown downtime of patient.    Of note:  Patient has not received hemodialysis in approximately 1 month and had been declining other therapies.  Patient has been made DNR after coming in to the hospital.  Visited with patient in her room.  Patient remains on ventilator.  Patient does mildy respond to touch and voice.  Per Jacqlyn Larsen, RN, patient was following some commands this morning like squeezing of hand or nodding.  Patient was resting easy, with eyes closed and NAD at this time.  No family at bedside at this time.  Per discussion with Jacqlyn Larsen, RN, MD has been trying to get in touch with patient's family with no success.  They are wanting to get okay to switch patient to comfort care.  I tried to contact husband, left message on voicemail.  I also contacted Threasa Beards, [facility] SW, she will try to reach out to the family to have them contact the hospital also.    Patient has no continuous medications running at this time.  No antibiotics being given.  Patient received PRN medication of Morphine 4 MG/ML injection 2-4 mg, Dose 2mg  Q1H PRN via IV at 0213, so far today.   Will continue to follow patient and anticipate needs for discharge.  Thank you,  Edyth Gunnels, RN, [facility] Liaison [phone]  All hospital liaison's are now on [place].

## 2016-10-25 NOTE — Progress Notes (Signed)
eLink Physician-Brief Progress Note Patient Name: Tami Elliott DOB: 1951-05-06 MRN: [id number]   Date of Service  10/25/2016  HPI/Events of Note  Notified of need for DVT and stress ulcer prophylaxis.   eICU Interventions  Will order: 1. Place SCD's. 2. Protonix IV.      Intervention Category Intermediate Interventions: Best-practice therapies (e.g. DVT, beta blocker, etc.)  Sommer,Steven Eugene 10/25/2016, 10:35 PM

## 2016-10-25 NOTE — Progress Notes (Signed)
Attempted to try to reach family via phone number on file. No answer. Voicemail left with units contact number. Will continue to monitor.

## 2016-10-25 NOTE — Progress Notes (Signed)
PCCM Progress Note  66 yo female presented with cardiac arrest.  She has hx of metastatic tongue cancer and ESRD.  She has not received HD in several weeks, had been declining other therapies, and was in the process of enrolling in hospice.  She was intubated and resuscitated in the ER.  After d/w family, decision was made for DNR status and not to pursue any additional therapies.  Overnight: no son arrival , despite efforts to contact, leaving messages, follows commands  BP 129/64   Pulse 76   Temp 97.9 F (36.6 C) (Axillary)   Resp 14   Ht 5\' 6"  (1.676 m)   Wt 84.4 kg (186 lb 1.1 oz)   SpO2 98%   BMI 30.03 kg/m   General - ill appearing Neuro - follows commands, moves ext, moves arms, pupils remain not even HEENT - ETT in place, mass left Cardiac - regular s1 s2 rrr Chest - coarse ronchi Abd - soft, non tender Ext - 1+ edema Skin - induration over Lt neck  Assessment: Cardiac arrest from hyperkalemia and metabolic acidosis Lactic acidosis Acute hypoxic respiratory failure Anoxic encephalopathy ESRD Metastatic tongue cancer Acute pulmonary edema Hx of depression DM type II Hx of hypothyroidism Anemia of critical illness and chronic disease  Plan: DNR HD will not change outcome Have caled son, will cal son again now, if we cannot reach , will send police to house If we are still unable to get son, will ask social work to file for hospital guardianship She needs comfort care and is suffering on vent Wean now ps 8, cpap 5,  Will also attempt to discuss her wishes and judgement on vent Assess for  Pain, morphine Attempt to call hospice for her AD Updated pt as best can  Ccm time 30 min  Lavon Paganini. Titus Mould, MD, [place] Pgr: [facility]

## 2016-10-26 MED ORDER — MORPHINE BOLUS VIA INFUSION
5.0000 mg | INTRAVENOUS | Status: DC | PRN
  Filled 2016-10-26: qty 20

## 2016-10-26 MED ORDER — SODIUM CHLORIDE 0.9 % IV SOLN
10.0000 mg/h | INTRAVENOUS | Status: DC
  Administered 2016-10-26: 10 mg/h via INTRAVENOUS
  Filled 2016-10-26: qty 5
  Filled 2016-10-26: qty 10

## 2016-10-31 ENCOUNTER — Telehealth: Payer: Self-pay

## 2016-10-31 NOTE — Telephone Encounter (Signed)
On 10/31/2016 I received a death certificate from [facility] (original). The death certificate is for cremation. The patient is a patient of Doctor Titus Mould. The death certificate will be taken to [facility] ([place]) this pm for signature.  On 27-Nov-2016 I received the death certificate back from Doctor Titus Mould. I got the death certificate ready and called the funeral home to let them know the death certificate is ready for pickup. I also faxed a copy to the funeral home per the funeral home request.

## 2016-11-13 NOTE — Plan of Care (Signed)
Problem: Respiratory: Goal: Ability to maintain a clear airway and adequate ventilation will improve Outcome: Not Met (add Reason) Comfort care  Problem: Physical Regulation: Goal: Complications related to the disease process, condition or treatment will be avoided or minimized Outcome: Not Met (add Reason) Comfort care Goal: Quality of life will improve Outcome: Not Met (add Reason) Comfort care

## 2016-11-13 NOTE — Progress Notes (Signed)
   24-Nov-2016 1442  Clinical Encounter Type  Visited With Patient  Visit Type Initial  Referral From Patient  Consult/Referral To Chaplain  Recommendations (will follow up)  Stress Factors  Patient Stress Factors None identified (Pt. asleep)  Pt. Is asleep.  Chaplain will follow up as needed.  Chaplain Kofi Murrell A. Anavictoria Wilk , MA-PC , BA-REL/PHIL, [phone]

## 2016-11-13 NOTE — Progress Notes (Signed)
[facility] Work note LCSW made visit after receiving update that patient was extubated. Hospice Liaison RN, Amy met with LCSW for joint visit to meet with family. Patient grandson and son were at the bedside. Both were grieving appropriately and aware of patient status. They do question prognosis and met with floor RN, Levada Dy for update. No funeral plans in place, yet family report limited financial means. Patient wants cremation and LCSW offered provider information, encouraging contact as soon as possible. All family have visited that are going to visit. Support offered. Katherina Right, LCSW

## 2016-11-13 NOTE — Plan of Care (Signed)
Problem: Respiratory: Goal: Ability to maintain a clear airway and adequate ventilation will improve Outcome: Not Met (add Reason) Comfort care  Problem: Physical Regulation: Goal: Complications related to the disease process, condition or treatment will be avoided or minimized Outcome: Not Met (add Reason) Comfort care Goal: Quality of life will improve Outcome: Completed/Met Date Met: 10-31-2016 Comfort care

## 2016-11-13 NOTE — Progress Notes (Signed)
eLink Physician-Brief Progress Note Patient Name: Tami Elliott DOB: [birth year] MRN: [id number]   Date of Service  11-10-2016  HPI/Events of Note  Patient is comfort care. Request to transfer to Palliative Care Bed.   eICU Interventions  Will transfer to a Palliative Care Bed.      Intervention Category Minor Interventions: Routine modifications to care plan (e.g. PRN medications for pain, fever)  Tami Elliott 11/10/2016, 6:04 PM

## 2016-11-13 NOTE — Care Management Note (Addendum)
Case Management Note  Patient Details  Name: Tami Elliott MRN: [id number] Date of Birth: 07/14/1951  Subjective/Objective:                 Verified with Amy RN [facility], patient is GIP.    Action/Plan:  Anticipate hospital death.  Expected Discharge Date:                  Expected Discharge Plan:     In-House Referral:     Discharge planning Services     Post Acute Care Choice:    Choice offered to:     DME Arranged:    DME Agency:     HH Arranged:    HH Agency:     Status of Service:  In process, will continue to follow  If discussed at Long Length of Stay Meetings, dates discussed:    Additional Comments:  Carles Collet, RN Oct 27, 2016, 10:54 AM

## 2016-11-13 NOTE — Progress Notes (Signed)
[place] GIP RN Visit at 1030 am  This is a covered and related admission from 11/07/2016 with a [facility] diagnosis of malignant neoplasm of tongue. Code Status is DNR. Patient admitted after suffering cardiac arrest. Patient was found around noon that day unresponsive and pulseless. Family activated EMS and there was some question over code status. Patient family expressed they "wanted everything done". EMS performed CPR on patient and was able to get back ROSC after approximately 15 minutes. Unknown downtime of patient.   Visited with patient in her room. Patient remains on ventilator.  Patient is resting comfortably and NAD.  Husband and son were in the room at bedside when I arrived.  They advised that they spoke with MD this morning and that patient will be terminally extubated at some point today.  I spoke with Levada Dy, RN, who confirmed same.  Sent message to [facility] SW, Threasa Beards, to advise of patient and family condition.   Family expressed they did not need anything at this time, but they were very saddened with situation being what it is.  Emotional support offered.    Patient receiving continuous infusion:  Morphine 250mg  in sodium chloride 0.9% 250 mL (1 mg/mL) infusion, Rate 10 mL/hr, Dose 10 mg/hr, continuous, via IV.  Will continue to follow patient and anticipate needs for discharge.  Thank you,  Edyth Gunnels, RN, [facility] Liaison [phone]  All hospital liaison's are now on [place].

## 2016-11-13 NOTE — Progress Notes (Signed)
Pt's husband called and informed of pts new room number. Tami Elliott

## 2016-11-13 NOTE — Discharge Summary (Addendum)
NAMETROYCE, FEBO             ACCOUNT NO.:  [id number]  MEDICAL RECORD NO.:  [id number]  LOCATION:  4N25C                        FACILITY:  [facility]  PHYSICIAN:  Raylene Miyamoto, MD DATE OF BIRTH:  [birth year]  DATE OF ADMISSION:  10/14/2016 DATE OF DISCHARGE:  11-10-2016                              DISCHARGE SUMMARY   DEATH SUMMARY  HOSPITAL COURSE:  This was a 66 year old female with history of end- stage renal disease, history of metastatic tongue cancer, about to enroll in hospice who presented after cardiac arrest at home.  She had ROSC and remained encephalopathic on the ventilator machine with very poor prognosis.  The patient was made DNR after discussions with the husband, and the husband was very difficult to find and was avoiding medical communications.  Eventually, the patient did wake up and followed maybe some simple commands, but still with severe encephalopathy, status post arrest with a terminal cancer eroding in from her neck with a very poor prognosis of any functional recovery. The family arrived and it was clear that the patient would not want this kind of heroic measures and we provided comfort care and the patient expired.  FINAL DIAGNOSES UPON DEATH: 1. Status post cardiac arrest secondary to likely mucus plugging in     the setting of upper airway obstruction from cancer.  Cardiac arrest secondary to Metastatic neck cancer, Primary tongue cancer, Hyperkalemia and Acidosis      2. Extensive terminal wide metastatic tongue cancer to neck local mets 3. Anoxic encephalopathy. 4. Acute hypoxic respiratory failure. 5. End-stage renal disease. 6. Acute pulmonary edema.     Raylene Miyamoto, MD     DJF/MEDQ  D:  10/31/2016  T:  10/31/2016  Job:  [id number]

## 2016-11-13 NOTE — Progress Notes (Signed)
PCCM Progress Note  66 yo female presented with cardiac arrest.  She has hx of metastatic tongue cancer and ESRD.  She has not received HD in several weeks, had been declining other therapies, and was in the process of enrolling in hospice.  She was intubated and resuscitated in the ER.  After d/w family, decision was made for DNR status and not to pursue any additional therapies.  Overnight: fmaily made it to bedside, int FC  BP (!) 133/57 (BP Location: Right Arm)   Pulse 78   Temp 98.5 F (36.9 C) (Axillary)   Resp 16   Ht 5\' 6"  (1.676 m)   Wt 84.4 kg (186 lb 1.1 oz)   SpO2 97%   BMI 30.03 kg/m   General - ill appearing Neuro - follows commands, moves ext, moves arms, pupils remain not even HEENT - ETT in place, mass left Cardiac - regular s1 s2 rrr Chest - coarse ronchi increased  Abd - soft, non tender Ext - 1+ edema Skin - induration over Lt neck  Assessment: Cardiac arrest from hyperkalemia and metabolic acidosis Lactic acidosis Acute hypoxic respiratory failure Anoxic encephalopathy ESRD Metastatic tongue cancer Acute pulmonary edema Hx of depression DM type II Hx of hypothyroidism Anemia of critical illness and chronic disease  Plan: DNR Extensive discussion with family son, husband. We discussed the poor prognosis and likely poor quality of life. Family has decided to offer full comfort care. They are aware that the patient may be transferred to palliative care floor for continued comfort care needs. They have been fully updated on the process and expectations. I am concerned about patency of airway and offerred to dc vent but keep ett in, fmaily said she would not wnt that, may need oral airway.  Morphine to rr 12-18 and pain control May need int versed fmaily updated in full by me wil assess cpap 5 ps 5, goal 30 sec for rr titration        Ccm time 30 min  Lavon Paganini. Titus Mould, MD, [place] Pgr: [facility]

## 2016-11-13 NOTE — Plan of Care (Signed)
Problem: Physical Regulation: Goal: Complications related to the disease process, condition or treatment will be avoided or minimized Outcome: Not Applicable Date Met: 10/14/2016 Comfort care   

## 2016-11-13 NOTE — Plan of Care (Signed)
Problem: Activity: Goal: Ability to tolerate increased activity will improve Outcome: Not Met (add Reason) Pt is dying, palliative care now  Problem: Nutritional: Goal: Intake of prescribed amount of daily calories will improve Outcome: Not Met (add Reason) Palliative, comfort care  Problem: Respiratory: Goal: Ability to maintain a clear airway and adequate ventilation will improve Outcome: Not Met (add Reason) Comfort care, withdrawing vent  Problem: Education: Goal: Knowledge of the prescribed therapeutic regimen will improve Outcome: Completed/Met Date Met: 25-Nov-2016 Family at bedside ,aware  Problem: Physical Regulation: Goal: Complications related to the disease process, condition or treatment will be avoided or minimized Outcome: Not Met (add Reason) Comfort care Goal: Quality of life will improve Outcome: Not Met (add Reason) Comfort care  Problem: Respiratory: Goal: Verbalizations of increased ease of respirations will increase Outcome: Not Met (add Reason) Comfort care,  Problem: Pain Management: Goal: General experience of comfort will improve Outcome: Completed/Met Date Met: 25-Nov-2016 Morphine gtt, comfort care

## 2016-11-13 NOTE — Progress Notes (Signed)
Patient extubated to end of life-sustaining protocol measures to RA. Patient comfortable at this time. Family and Rn at bedside.

## 2016-11-13 NOTE — Progress Notes (Signed)
Wasted 200ccfentanyl insink with Scientist, physiological as witness. Attempted to call pts husband again with no results.    Pt expired at [place], verified by 2 rn's. Zarian Colpitts and sandra merlini by absence of heart sounds for 1 minute. Dr. Corinna Lines made aware. Etta Quill

## 2016-11-13 NOTE — Plan of Care (Signed)
Problem: Activity: Goal: Ability to tolerate increased activity will improve Outcome: Not Applicable Date Met: 15/04/13 Comfort care

## 2016-11-13 DEATH — deceased

## 2018-09-06 IMAGING — XA IR DAILY SHUNT INTRO NEEDLE/INTRACATH INITIAL W/IMG*L*
5 series · 14 of 24 positions shown · IV contrast (IODINE)
Comparison: none

INDICATION: End-stage renal disease with indwelling left forearm radiocephalic
fistula. This was placed in April 2015. There has been
difficulty cannulating the fistula.

[Series 1: body 4 care · 3 of 6 slices shown (1 of 5)]
[im 1/6]
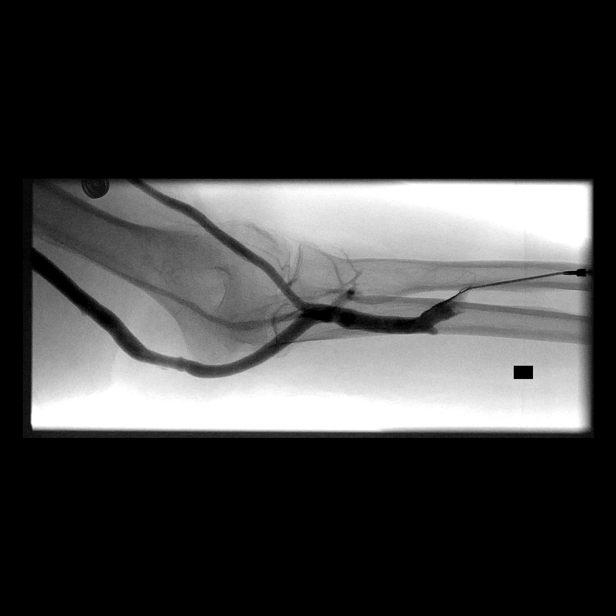
[im 3/6]
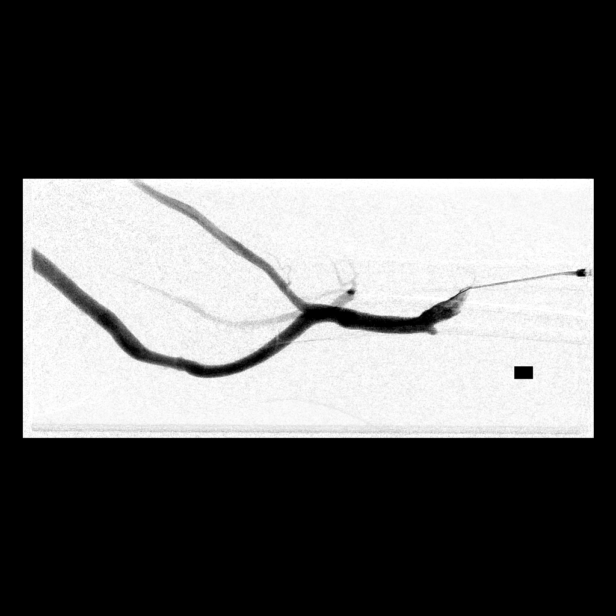
[im 6/6]
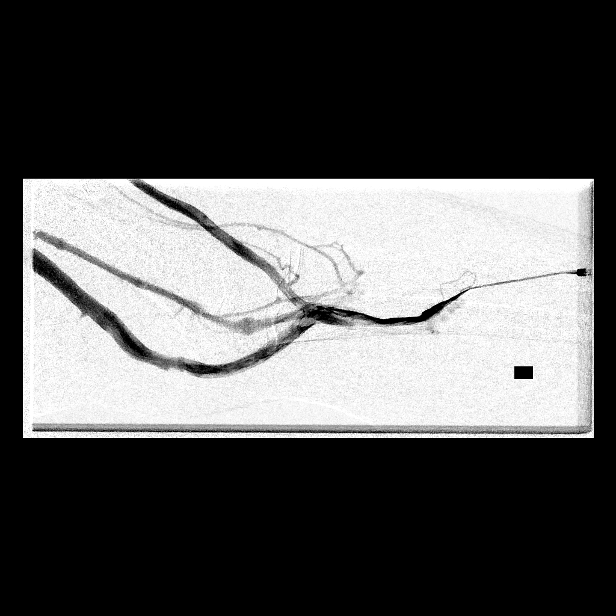

[Series 2: body 4 care · 3 of 6 slices shown (2 of 5)]
[im 2/6]
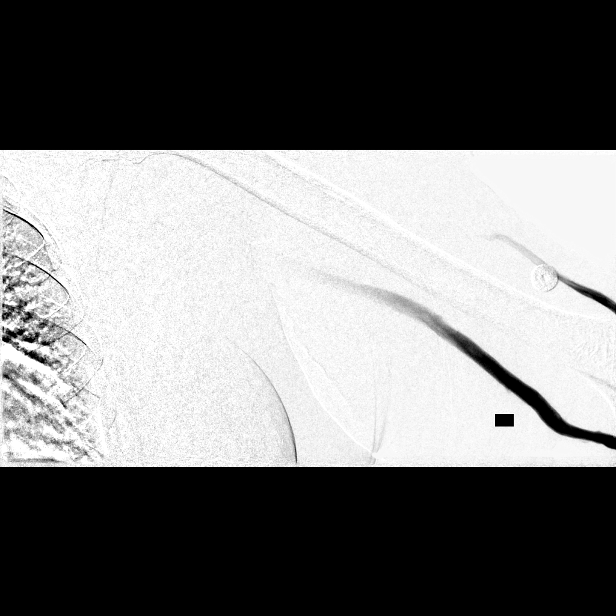
[im 3/6]
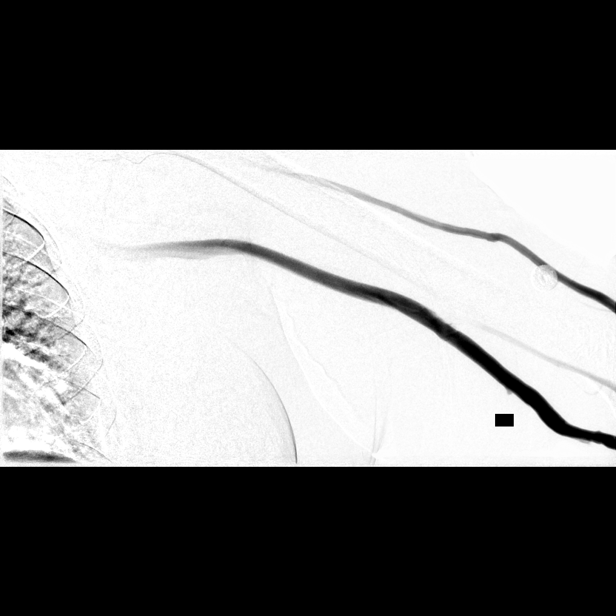
[im 6/6]
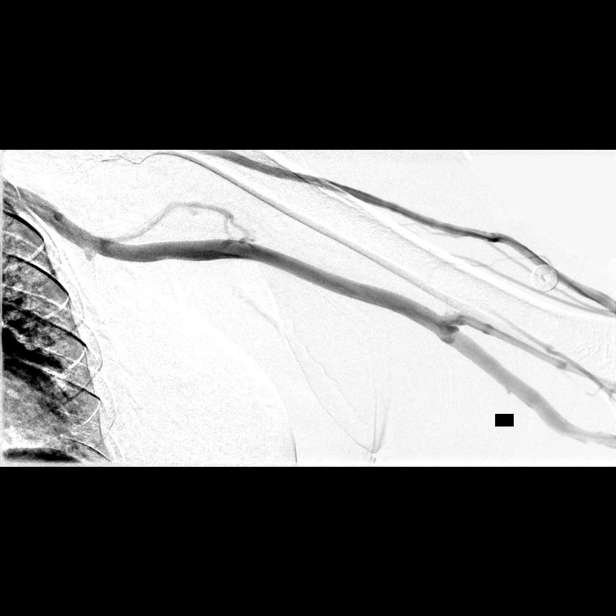

[Series 3: body 4 care · 3 of 7 slices shown (3 of 5)]
[im 2/7]
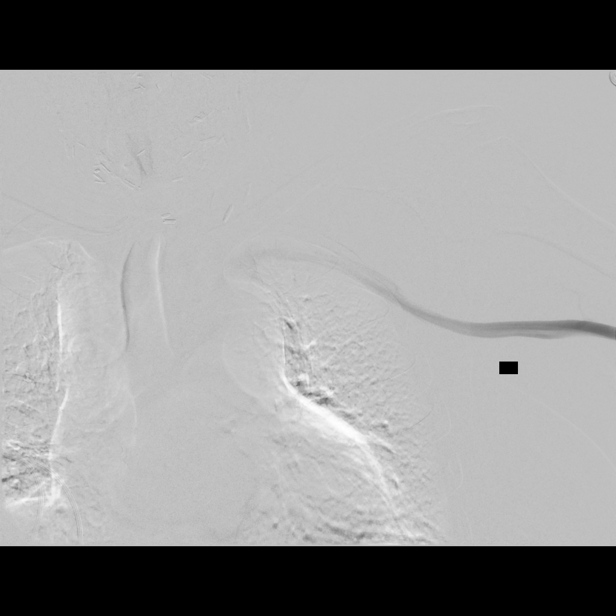
[im 3/7]
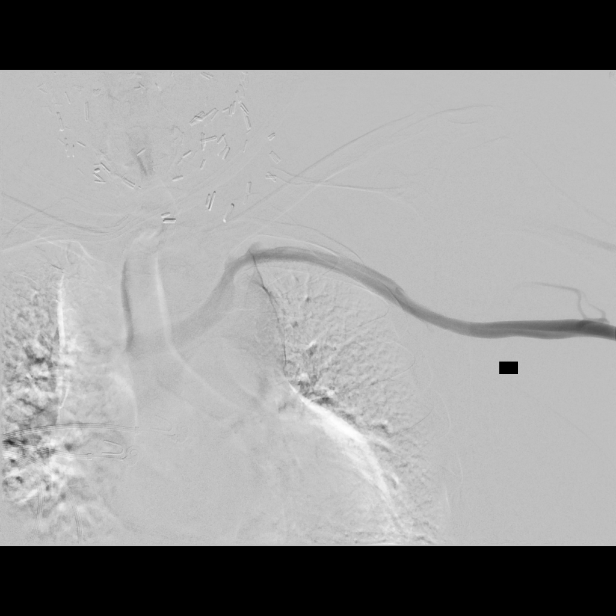
[im 5/7]
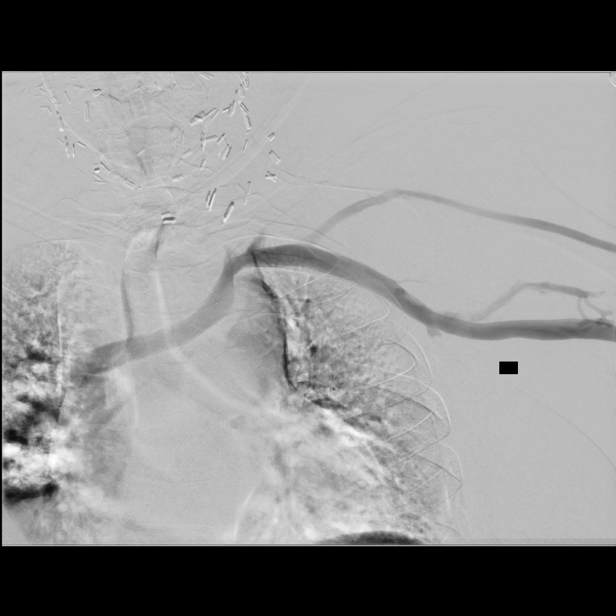

[Series 4: body 4 care · 3 of 6 slices shown (4 of 5)]
[im 1/6]
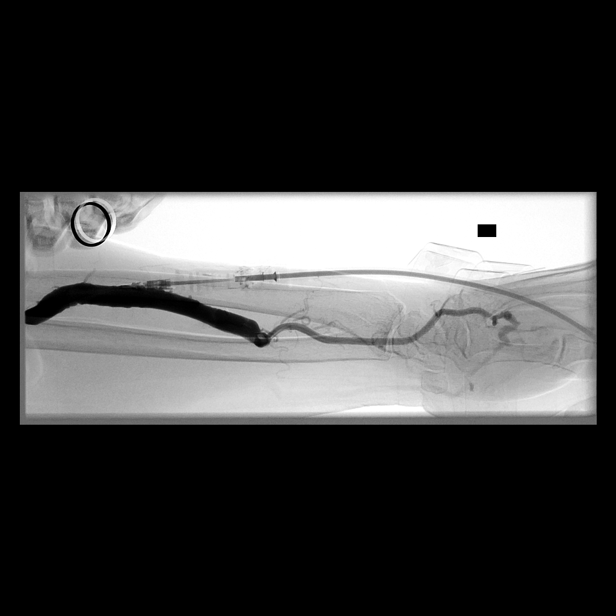
[im 3/6]
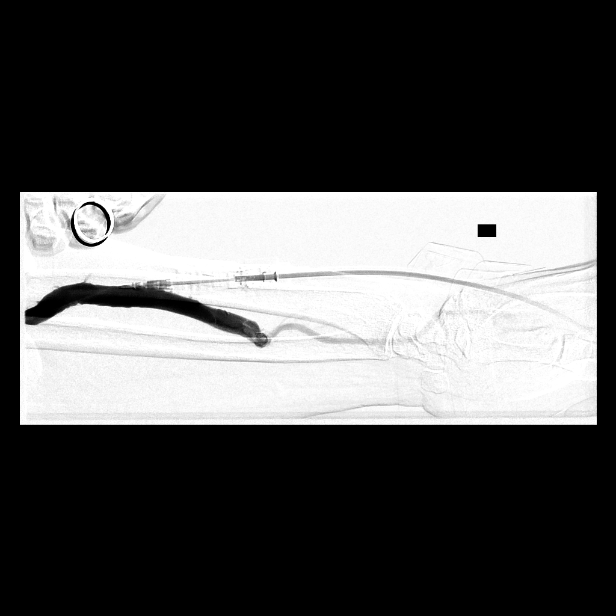
[im 4/6]
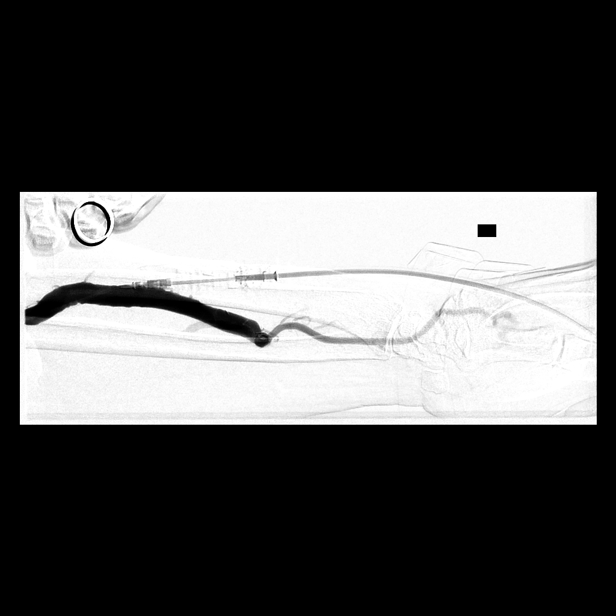

[Series 5: body 4 care · 2 of 3 slices shown (5 of 5)]
[im 1/3]
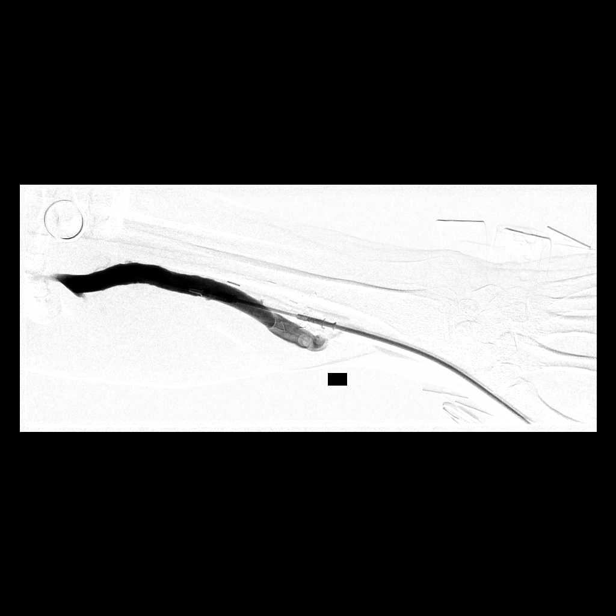
[im 3/3]
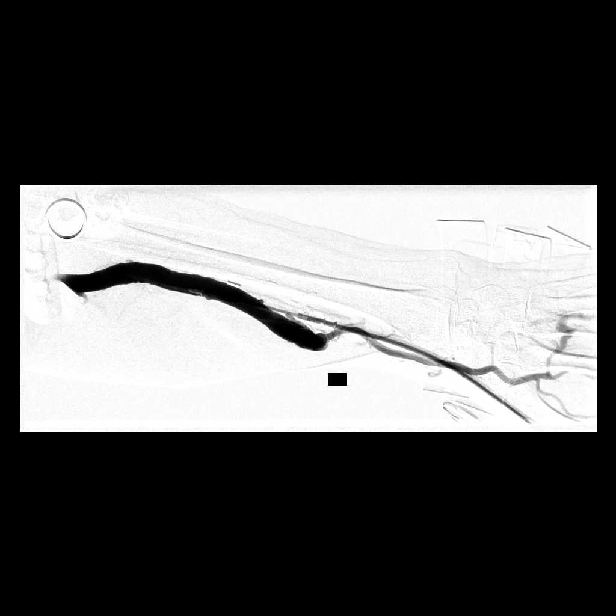

[14 of 24 positions shown; findings below may reference images not displayed]

EXAM:
IR DAILY SHUNT INTRO NEEDLE/INTRACATH INITIAL W/IMG LEFT

MEDICATIONS:
None.

ANESTHESIA/SEDATION:
None

CONTRAST:  30 mL [id number]

FLUOROSCOPY TIME:  Fluoroscopy Time: 36 seconds. (23 mGy).

COMPLICATIONS:
None immediate.

PROCEDURE:
Informed written consent was obtained from the patient after a
thorough discussion of the procedural risks, benefits and
alternatives. All questions were addressed. Maximal Sterile Barrier
Technique was utilized including caps, mask, sterile gowns, sterile
gloves, sterile drape, hand hygiene and skin antiseptic. A timeout
was performed prior to the initiation of the procedure.

An 18 gauge Angiocath was introduced into cephalic vein outflow
within the forearm. Fistula study was performed with visualization
of venous outflow through to the level of the SVC. Proximal fistula
and anastomosis with the radial artery was study during temporary
compression of cephalic vein outflow to allow reflux of contrast.
After the procedure the angiocath was removed and hemostasis
obtained with manual compression.
FINDINGS: Anastomosis between the radial artery and cephalic vein in the
distal forearm is normally patent. Initial segment of the cephalic
vein within the distal and mid forearm appears well mature. At the
antecubital fossa, there is competing venous outflow in the deep
venous system and the cephalic vein above the antecubital fossa is
of smaller caliber.

Venous outflow in the upper arm is normally patent and predominantly
via the deep venous system. The central veins are normally patent
including the SVC.
IMPRESSION: Patent left radiocephalic AV fistula. Initial segment of the
cephalic vein shows appropriate dilatation. There is competing
venous outflow into the deep venous system at the antecubital fossa.

ACCESS:
This access remains amenable to future percutaneous interventions as
clinically indicated.
